# Patient Record
Sex: Female | Born: 1975 | Race: Black or African American | Hispanic: No | Marital: Single | Smoking: Current every day smoker
Health system: Southern US, Community
[De-identification: ages and names within clinical notes are randomized; demographics above are authoritative.]

## PROBLEM LIST (undated history)

## (undated) DIAGNOSIS — I671 Cerebral aneurysm, nonruptured: Secondary | ICD-10-CM

## (undated) DIAGNOSIS — R519 Headache, unspecified: Secondary | ICD-10-CM

## (undated) DIAGNOSIS — R51 Headache: Secondary | ICD-10-CM

## (undated) DIAGNOSIS — Z9289 Personal history of other medical treatment: Secondary | ICD-10-CM

## (undated) DIAGNOSIS — I471 Supraventricular tachycardia, unspecified: Secondary | ICD-10-CM

## (undated) DIAGNOSIS — R079 Chest pain, unspecified: Secondary | ICD-10-CM

## (undated) DIAGNOSIS — E041 Nontoxic single thyroid nodule: Secondary | ICD-10-CM

## (undated) DIAGNOSIS — I10 Essential (primary) hypertension: Secondary | ICD-10-CM

## (undated) DIAGNOSIS — B019 Varicella without complication: Secondary | ICD-10-CM

## (undated) DIAGNOSIS — Z720 Tobacco use: Secondary | ICD-10-CM

## (undated) DIAGNOSIS — G4730 Sleep apnea, unspecified: Secondary | ICD-10-CM

## (undated) HISTORY — DX: Supraventricular tachycardia, unspecified: I47.10

## (undated) HISTORY — DX: Personal history of other medical treatment: Z92.89

## (undated) HISTORY — DX: Chest pain, unspecified: R07.9

## (undated) HISTORY — DX: Nontoxic single thyroid nodule: E04.1

## (undated) HISTORY — DX: Sleep apnea, unspecified: G47.30

## (undated) HISTORY — DX: Cerebral aneurysm, nonruptured: I67.1

## (undated) HISTORY — DX: Tobacco use: Z72.0

## (undated) HISTORY — DX: Supraventricular tachycardia: I47.1

## (undated) HISTORY — DX: Varicella without complication: B01.9

---

## 2012-04-06 ENCOUNTER — Emergency Department: Payer: Self-pay | Admitting: Emergency Medicine

## 2012-04-06 LAB — URINALYSIS, COMPLETE
Bilirubin,UR: NEGATIVE
Ketone: NEGATIVE
Nitrite: POSITIVE
Ph: 6 (ref 4.5–8.0)
Protein: NEGATIVE
RBC,UR: 1 /HPF (ref 0–5)
Squamous Epithelial: 9

## 2012-04-06 LAB — COMPREHENSIVE METABOLIC PANEL
Albumin: 3.4 g/dL (ref 3.4–5.0)
Alkaline Phosphatase: 83 U/L (ref 50–136)
Anion Gap: 5 — ABNORMAL LOW (ref 7–16)
BUN: 13 mg/dL (ref 7–18)
Bilirubin,Total: 0.3 mg/dL (ref 0.2–1.0)
Calcium, Total: 9 mg/dL (ref 8.5–10.1)
Co2: 31 mmol/L (ref 21–32)
Creatinine: 0.88 mg/dL (ref 0.60–1.30)
EGFR (Non-African Amer.): >60
Glucose: 106 mg/dL — ABNORMAL HIGH (ref 65–99)
Osmolality: 284 (ref 275–301)
Potassium: 3.2 mmol/L — ABNORMAL LOW (ref 3.5–5.1)
SGPT (ALT): 23 U/L (ref 12–78)
Sodium: 142 mmol/L (ref 136–145)
Total Protein: 7.3 g/dL (ref 6.4–8.2)

## 2012-04-06 LAB — CBC
HCT: 38 % (ref 35.0–47.0)
HGB: 12.8 g/dL (ref 12.0–16.0)
MCH: 27 pg (ref 26.0–34.0)
MCV: 80 fL (ref 80–100)
Platelet: 231 10*3/uL (ref 150–440)
RBC: 4.74 10*6/uL (ref 3.80–5.20)
WBC: 7.7 10*3/uL (ref 3.6–11.0)

## 2012-04-06 LAB — TROPONIN I: Troponin-I: <0.02 ng/mL

## 2012-08-21 ENCOUNTER — Emergency Department: Payer: Self-pay | Admitting: Emergency Medicine

## 2012-08-21 LAB — URINALYSIS, COMPLETE
Blood: NEGATIVE
Glucose,UR: NEGATIVE mg/dL (ref 0–75)
Leukocyte Esterase: NEGATIVE
Ph: 6 (ref 4.5–8.0)
Protein: 30
RBC,UR: 3 /HPF (ref 0–5)
Specific Gravity: 1.027 (ref 1.003–1.030)
Squamous Epithelial: 2

## 2012-10-28 ENCOUNTER — Emergency Department: Payer: Self-pay | Admitting: Unknown Physician Specialty

## 2012-10-28 LAB — URINALYSIS, COMPLETE
Bilirubin,UR: NEGATIVE
Blood: NEGATIVE
Glucose,UR: NEGATIVE mg/dL (ref 0–75)
Leukocyte Esterase: NEGATIVE
Protein: NEGATIVE
RBC,UR: 1 /HPF (ref 0–5)
Squamous Epithelial: 3

## 2012-10-28 LAB — BASIC METABOLIC PANEL
Calcium, Total: 8.7 mg/dL (ref 8.5–10.1)
Co2: 27 mmol/L (ref 21–32)
EGFR (Non-African Amer.): >60
Glucose: 93 mg/dL (ref 65–99)

## 2012-10-28 LAB — CBC
MCHC: 32.8 g/dL (ref 32.0–36.0)
MCV: 80 fL (ref 80–100)
RBC: 5.03 10*6/uL (ref 3.80–5.20)
RDW: 15.7 % — ABNORMAL HIGH (ref 11.5–14.5)

## 2012-10-28 LAB — CK TOTAL AND CKMB (NOT AT ARMC)
CK, Total: 136 U/L (ref 21–215)
CK-MB: 2.6 ng/mL (ref 0.5–3.6)

## 2012-10-28 LAB — LIPASE, BLOOD: Lipase: 69 U/L — ABNORMAL LOW (ref 73–393)

## 2012-10-31 ENCOUNTER — Emergency Department: Payer: Self-pay | Admitting: Emergency Medicine

## 2012-10-31 LAB — COMPREHENSIVE METABOLIC PANEL
Albumin: 3.8 g/dL (ref 3.4–5.0)
Anion Gap: 4 — ABNORMAL LOW (ref 7–16)
BUN: 16 mg/dL (ref 7–18)
Bilirubin,Total: 0.4 mg/dL (ref 0.2–1.0)
Calcium, Total: 9.3 mg/dL (ref 8.5–10.1)
Co2: 30 mmol/L (ref 21–32)
Creatinine: 0.97 mg/dL (ref 0.60–1.30)
EGFR (African American): >60
EGFR (Non-African Amer.): >60
Glucose: 86 mg/dL (ref 65–99)
Potassium: 3.4 mmol/L — ABNORMAL LOW (ref 3.5–5.1)
Sodium: 137 mmol/L (ref 136–145)

## 2012-10-31 LAB — CBC WITH DIFFERENTIAL/PLATELET
Basophil #: 0.2 10*3/uL — ABNORMAL HIGH (ref 0.0–0.1)
Basophil %: 2.8 %
Eosinophil #: 0.2 10*3/uL (ref 0.0–0.7)
Eosinophil %: 2.9 %
HCT: 41.3 % (ref 35.0–47.0)
Lymphocyte #: 3.1 10*3/uL (ref 1.0–3.6)
MCV: 81 fL (ref 80–100)
Monocyte #: 0.6 x10 3/mm (ref 0.2–0.9)
Neutrophil #: 3.6 10*3/uL (ref 1.4–6.5)
Neutrophil %: 47.3 %
Platelet: 270 10*3/uL (ref 150–440)
RBC: 5.12 10*6/uL (ref 3.80–5.20)
RDW: 15.5 % — ABNORMAL HIGH (ref 11.5–14.5)
WBC: 7.7 10*3/uL (ref 3.6–11.0)

## 2012-10-31 LAB — LIPASE, BLOOD: Lipase: 85 U/L (ref 73–393)

## 2013-08-25 ENCOUNTER — Emergency Department: Payer: Self-pay | Admitting: Emergency Medicine

## 2013-08-25 LAB — BASIC METABOLIC PANEL
Anion Gap: 6 — ABNORMAL LOW (ref 7–16)
BUN: 10 mg/dL (ref 7–18)
CALCIUM: 9 mg/dL (ref 8.5–10.1)
Chloride: 106 mmol/L (ref 98–107)
Co2: 30 mmol/L (ref 21–32)
Creatinine: 0.79 mg/dL (ref 0.60–1.30)
EGFR (African American): >60
EGFR (Non-African Amer.): >60
GLUCOSE: 107 mg/dL — AB (ref 65–99)
Osmolality: 283 (ref 275–301)
POTASSIUM: 2.9 mmol/L — AB (ref 3.5–5.1)
Sodium: 142 mmol/L (ref 136–145)

## 2013-08-25 LAB — CBC
HCT: 36.7 % (ref 35.0–47.0)
HGB: 11.9 g/dL — ABNORMAL LOW (ref 12.0–16.0)
MCH: 25.9 pg — ABNORMAL LOW (ref 26.0–34.0)
MCHC: 32.5 g/dL (ref 32.0–36.0)
MCV: 80 fL (ref 80–100)
Platelet: 253 10*3/uL (ref 150–440)
RBC: 4.6 10*6/uL (ref 3.80–5.20)
RDW: 15.6 % — AB (ref 11.5–14.5)
WBC: 8.3 10*3/uL (ref 3.6–11.0)

## 2013-08-25 LAB — URINALYSIS, COMPLETE
BLOOD: NEGATIVE
Bacteria: NONE SEEN
Bilirubin,UR: NEGATIVE
Glucose,UR: NEGATIVE mg/dL (ref 0–75)
LEUKOCYTE ESTERASE: NEGATIVE
Nitrite: POSITIVE
Ph: 6 (ref 4.5–8.0)
Protein: NEGATIVE
RBC,UR: 1 /HPF (ref 0–5)
SPECIFIC GRAVITY: 1.015 (ref 1.003–1.030)
Squamous Epithelial: 2
WBC UR: 1 /HPF (ref 0–5)

## 2013-08-25 LAB — TROPONIN I: Troponin-I: 0.02 ng/mL

## 2015-01-05 ENCOUNTER — Encounter: Payer: Self-pay | Admitting: General Practice

## 2015-01-05 ENCOUNTER — Emergency Department: Payer: BLUE CROSS/BLUE SHIELD

## 2015-01-05 ENCOUNTER — Emergency Department
Admission: EM | Admit: 2015-01-05 | Discharge: 2015-01-05 | Disposition: A | Discharge status: Home / Self Care | Payer: BLUE CROSS/BLUE SHIELD | Attending: Emergency Medicine | Admitting: Emergency Medicine

## 2015-01-05 DIAGNOSIS — I10 Essential (primary) hypertension: Secondary | ICD-10-CM

## 2015-01-05 DIAGNOSIS — J069 Acute upper respiratory infection, unspecified: Secondary | ICD-10-CM | POA: Diagnosis not present

## 2015-01-05 DIAGNOSIS — R059 Cough, unspecified: Secondary | ICD-10-CM

## 2015-01-05 DIAGNOSIS — Z720 Tobacco use: Secondary | ICD-10-CM | POA: Diagnosis not present

## 2015-01-05 DIAGNOSIS — R05 Cough: Secondary | ICD-10-CM | POA: Diagnosis present

## 2015-01-05 HISTORY — DX: Essential (primary) hypertension: I10

## 2015-01-05 LAB — CBC WITH DIFFERENTIAL/PLATELET
BASOS ABS: 0 10*3/uL (ref 0–0.1)
Basophils Relative: 1 %
EOS ABS: 0.2 10*3/uL (ref 0–0.7)
Eosinophils Relative: 4 %
HEMATOCRIT: 38.4 % (ref 35.0–47.0)
HEMOGLOBIN: 12.6 g/dL (ref 12.0–16.0)
LYMPHS PCT: 29 %
Lymphs Abs: 1.1 10*3/uL (ref 1.0–3.6)
MCH: 26 pg (ref 26.0–34.0)
MCHC: 32.7 g/dL (ref 32.0–36.0)
MCV: 79.6 fL — AB (ref 80.0–100.0)
MONO ABS: 0.7 10*3/uL (ref 0.2–0.9)
Monocytes Relative: 17 %
NEUTROS PCT: 49 %
Neutro Abs: 2 10*3/uL (ref 1.4–6.5)
Platelets: 171 10*3/uL (ref 150–440)
RBC: 4.82 MIL/uL (ref 3.80–5.20)
RDW: 16.4 % — ABNORMAL HIGH (ref 11.5–14.5)
WBC: 4 10*3/uL (ref 3.6–11.0)

## 2015-01-05 LAB — COMPREHENSIVE METABOLIC PANEL
ALBUMIN: 3.7 g/dL (ref 3.5–5.0)
ALK PHOS: 65 U/L (ref 38–126)
ALT: 23 U/L (ref 14–54)
AST: 23 U/L (ref 15–41)
Anion gap: 8 (ref 5–15)
BUN: 14 mg/dL (ref 6–20)
CHLORIDE: 104 mmol/L (ref 101–111)
CO2: 26 mmol/L (ref 22–32)
CREATININE: 1.01 mg/dL — AB (ref 0.44–1.00)
Calcium: 9.3 mg/dL (ref 8.9–10.3)
GFR calc Af Amer: >60 mL/min (ref >60)
Glucose, Bld: 106 mg/dL — ABNORMAL HIGH (ref 65–99)
Potassium: 3.2 mmol/L — ABNORMAL LOW (ref 3.5–5.1)
SODIUM: 138 mmol/L (ref 135–145)
Total Bilirubin: 0.6 mg/dL (ref 0.3–1.2)
Total Protein: 7.4 g/dL (ref 6.5–8.1)

## 2015-01-05 NOTE — ED Notes (Signed)
Pt informed to return if life threatening symptoms occur.   

## 2015-01-05 NOTE — ED Notes (Signed)
Pt. Arrived to ed from home with reports of experiencing cold symptoms over the last week. Pt states "it started as a runny nose, now i have a cough". Pt reprots experiencing a cough over the last few days. Pt describes having a mild fever at home, using OTC medications. Hx of  101 fever per pt.

## 2015-01-05 NOTE — ED Provider Notes (Signed)
[facility] Emergency Department Provider Note  ____________________________________________  Time seen: 12 PM  I have reviewed the triage vital signs and the nursing notes.   HISTORY  Chief Complaint Cough; Fever; and URI      HPI Bianca Acosta is a 39 y.o. female who complains of runny nose one week ago that then turned into a cough for the last 4-5 days. She became concerned because she had a fever yesterday of 102.1 which is improved today. She denies short of breath. She denies chest pain. She reports yellow productive mucus with cough. She doesn't smoking. She describes the severity as a 5 out of 10. No pleurisy     Past Medical History  Diagnosis Date  . Hypertension     There are no active problems to display for this patient.   History reviewed. No pertinent past surgical history.  No current outpatient prescriptions on file.  Allergies Ciprofloxacin  No family history on file.  Social History History  Substance Use Topics  . Smoking status: Current Every Day Smoker -- 0.50 packs/day  . Smokeless tobacco: Never Used  . Alcohol Use: No    Review of Systems  Constitutional: Negative for fever. Eyes: Negative for visual changes. ENT: Negative for sore throat. Positive for rhinorrhea Cardiovascular: Negative for chest pain. Respiratory: Negative for shortness of breath. Positive for cough Gastrointestinal: Negative for abdominal pain, vomiting and diarrhea. Genitourinary: Negative for dysuria. Musculoskeletal: Negative for back pain. Skin: Negative for rash. Neurological: Negative for headaches or focal weakness Psychiatric: Positive for anxiety  10-point ROS otherwise negative.  ____________________________________________   PHYSICAL EXAM:  VITAL SIGNS: ED Triage Vitals  Enc Vitals Group     BP 01/05/15 1043 220/108 mmHg     Pulse Rate 01/05/15 1043 107     Resp 01/05/15 1043 18     Temp 01/05/15 1043 99 F  (37.2 C)     Temp Source 01/05/15 1043 Oral     SpO2 01/05/15 1043 99 %     Weight 01/05/15 1043 276 lb (125.193 kg)     Height 01/05/15 1043 5\' 3"  (1.6 m)     Head Cir --      Peak Flow --      Pain Score 01/05/15 1044 0     Pain Loc --      Pain Edu? --      Excl. in [place]? --     Constitutional: Alert and oriented. Well appearing and in no distress. Eyes: Conjunctivae are normal. PERRL. ENT   Head: Normocephalic and atraumatic.   Nose: Positive for rhinorrhea   Mouth/Throat: Mucous membranes are moist. Cardiovascular: Normal rate, regular rhythm. Normal and symmetric distal pulses are present in all extremities. No murmurs, rubs, or gallops. Respiratory: Normal respiratory effort without tachypnea nor retractions. Breath sounds are clear and equal bilaterally.  Gastrointestinal: Soft and non-tender in all quadrants. No distention. There is no CVA tenderness. Genitourinary: deferred Musculoskeletal: Nontender with normal range of motion in all extremities. No lower extremity tenderness nor edema. Neurologic:  Normal speech and language. No gross focal neurologic deficits are appreciated. Skin:  Skin is warm, dry and intact. No rash noted. Psychiatric: Mood and affect are normal. Patient exhibits appropriate insight and judgment.  ____________________________________________    LABS (pertinent positives/negatives)  Labs Reviewed  CBC WITH DIFFERENTIAL/PLATELET - Abnormal; Notable for the following:    MCV 79.6 (*)    RDW 16.4 (*)    All other components within normal  limits  COMPREHENSIVE METABOLIC PANEL - Abnormal; Notable for the following:    Potassium 3.2 (*)    Glucose, Bld 106 (*)    Creatinine, Ser 1.01 (*)    All other components within normal limits    ____________________________________________   EKG  None  ____________________________________________    RADIOLOGY  Chest x-ray no acute  distress  ____________________________________________   PROCEDURES  Procedure(s) performed: none  Critical Care performed: none  ____________________________________________   INITIAL IMPRESSION / ASSESSMENT AND PLAN / ED COURSE  Pertinent labs & imaging results that were available during my care of the patient were reviewed by me and considered in my medical decision making (see chart for details).  Patient well-appearing. History of present illness consistent with upper respiratory infection likely viral in nature. Chest x-ray clear. Labs benign. Recommend supportive care. Patient with a history of hypertension. The pressure improved without treatment in the emergency department given time. Recommend follow-up with her PCP for further blood pressure management.  ____________________________________________   FINAL CLINICAL IMPRESSION(S) / ED DIAGNOSES  Final diagnoses:  Upper respiratory infection  Essential hypertension     Lavonia Drafts, MD 01/05/15 1213

## 2015-01-05 NOTE — Discharge Instructions (Signed)
Upper Respiratory Infection, Adult An upper respiratory infection (URI) is also known as the common cold. It is often caused by a type of germ (virus). Colds are easily spread (contagious). You can pass it to others by kissing, coughing, sneezing, or drinking out of the same glass. Usually, you get better in 1 or 2 weeks.  HOME CARE   Only take medicine as told by your doctor.  Use a warm mist humidifier or breathe in steam from a hot shower.  Drink enough water and fluids to keep your pee (urine) clear or pale yellow.  Get plenty of rest.  Return to work when your temperature is back to normal or as told by your doctor. You may use a face mask and wash your hands to stop your cold from spreading. GET HELP RIGHT AWAY IF:   After the first few days, you feel you are getting worse.  You have questions about your medicine.  You have chills, shortness of breath, or brown or red spit (mucus).  You have yellow or brown snot (nasal discharge) or pain in the face, especially when you bend forward.  You have a fever, puffy (swollen) neck, pain when you swallow, or white spots in the back of your throat.  You have a bad headache, ear pain, sinus pain, or chest pain.  You have a high-pitched whistling sound when you breathe in and out (wheezing).  You have a lasting cough or cough up blood.  You have sore muscles or a stiff neck. MAKE SURE YOU:   Understand these instructions.  Will watch your condition.  Will get help right away if you are not doing well or get worse. Document Released: 01/16/2008 Document Revised: 10/22/2011 Document Reviewed: 11/04/2013 ExitCare Patient Information 2015 [employer]. This information is not intended to replace advice given to you by your health care provider. Make sure you discuss any questions you have with your health care provider.  

## 2015-06-14 ENCOUNTER — Other Ambulatory Visit: Payer: Self-pay | Admitting: Ophthalmology

## 2015-06-14 DIAGNOSIS — H532 Diplopia: Secondary | ICD-10-CM

## 2015-06-15 ENCOUNTER — Ambulatory Visit
Admission: RE | Admit: 2015-06-15 | Discharge: 2015-06-15 | Disposition: A | Discharge status: Home / Self Care | Payer: BLUE CROSS/BLUE SHIELD | Source: Ambulatory Visit | Attending: Ophthalmology | Admitting: Ophthalmology

## 2015-06-15 ENCOUNTER — Other Ambulatory Visit: Payer: Self-pay | Admitting: Nurse Practitioner

## 2015-06-15 DIAGNOSIS — H0520 Unspecified exophthalmos: Secondary | ICD-10-CM | POA: Diagnosis not present

## 2015-06-15 DIAGNOSIS — H532 Diplopia: Secondary | ICD-10-CM

## 2015-06-15 DIAGNOSIS — R51 Headache: Principal | ICD-10-CM

## 2015-06-15 DIAGNOSIS — R519 Headache, unspecified: Secondary | ICD-10-CM

## 2015-06-15 MED ORDER — IOHEXOL 350 MG/ML SOLN
75.0000 mL | Freq: Once | INTRAVENOUS | Status: AC | PRN
  Administered 2015-06-15: 75 mL via INTRAVENOUS

## 2015-06-18 ENCOUNTER — Emergency Department: Payer: BLUE CROSS/BLUE SHIELD

## 2015-06-18 ENCOUNTER — Encounter: Payer: Self-pay | Admitting: Emergency Medicine

## 2015-06-18 ENCOUNTER — Emergency Department
Admission: EM | Admit: 2015-06-18 | Discharge: 2015-06-18 | Disposition: A | Discharge status: Home / Self Care | Payer: BLUE CROSS/BLUE SHIELD | Attending: Emergency Medicine | Admitting: Emergency Medicine

## 2015-06-18 DIAGNOSIS — R51 Headache: Secondary | ICD-10-CM | POA: Insufficient documentation

## 2015-06-18 DIAGNOSIS — Z79899 Other long term (current) drug therapy: Secondary | ICD-10-CM | POA: Insufficient documentation

## 2015-06-18 DIAGNOSIS — Z720 Tobacco use: Secondary | ICD-10-CM | POA: Insufficient documentation

## 2015-06-18 DIAGNOSIS — I10 Essential (primary) hypertension: Secondary | ICD-10-CM | POA: Diagnosis not present

## 2015-06-18 DIAGNOSIS — R519 Headache, unspecified: Secondary | ICD-10-CM

## 2015-06-18 HISTORY — DX: Headache, unspecified: R51.9

## 2015-06-18 HISTORY — DX: Headache: R51

## 2015-06-18 LAB — SEDIMENTATION RATE: Sed Rate: 19 mm/hr (ref 0–20)

## 2015-06-18 MED ORDER — HYDROMORPHONE HCL 2 MG PO TABS
2.0000 mg | ORAL_TABLET | Freq: Two times a day (BID) | ORAL | Status: DC | PRN
Start: 2015-06-18 — End: 2015-11-01

## 2015-06-18 MED ORDER — METOCLOPRAMIDE HCL 5 MG/ML IJ SOLN
10.0000 mg | Freq: Once | INTRAMUSCULAR | Status: AC
  Administered 2015-06-18: 10 mg via INTRAVENOUS
  Filled 2015-06-18: qty 2

## 2015-06-18 MED ORDER — HYDROMORPHONE HCL 1 MG/ML IJ SOLN
1.0000 mg | Freq: Once | INTRAMUSCULAR | Status: AC
  Administered 2015-06-18: 1 mg via INTRAVENOUS
  Filled 2015-06-18: qty 1

## 2015-06-18 MED ORDER — HYDROMORPHONE HCL 2 MG PO TABS
2.0000 mg | ORAL_TABLET | Freq: Once | ORAL | Status: AC
  Administered 2015-06-18: 2 mg via ORAL
  Filled 2015-06-18: qty 1

## 2015-06-18 MED ORDER — SODIUM CHLORIDE 0.9 % IV BOLUS (SEPSIS)
1000.0000 mL | Freq: Once | INTRAVENOUS | Status: AC
  Administered 2015-06-18: 1000 mL via INTRAVENOUS

## 2015-06-18 MED ORDER — METOCLOPRAMIDE HCL 10 MG PO TABS
10.0000 mg | ORAL_TABLET | Freq: Three times a day (TID) | ORAL | Status: DC | PRN
Start: 2015-06-18 — End: 2016-04-23

## 2015-06-18 NOTE — ED Provider Notes (Signed)
Time Seen: Approximately 1223  I have reviewed the triage notes  Chief Complaint: Headache   History of Present Illness: Bianca Acosta is a 39 y.o. female who presents with a two-week history of a right-sided throbbing headache which radiates from behind her right eye back toward the right occipital area. She has been seen and evaluated by her primary physician and diagnosed with migraines since been taking Fioricet. Since also been followed by an ophthalmologist and has seen a specialist for unknown pathology at this time. She states she's had a recent history of double vision hours and wears a right eye patch. She denies any neck pain, fever, head trauma. She states she's had a focus CT of behind her right eye but her primary physician had described to her the necessity for a complete head CT. Patient denies any focal weakness at this time.  Past Medical History  Diagnosis Date  . Hypertension   . Headache     There are no active problems to display for this patient.   History reviewed. No pertinent past surgical history.  History reviewed. No pertinent past surgical history.  Current Outpatient Rx  Name  Route  Sig  Dispense  Refill  . butalbital-acetaminophen-caffeine (FIORICET WITH CODEINE) 50-325-40-30 MG capsule   Oral   Take 1 capsule by mouth 2 (two) times daily as needed. For headaches.         Marland Kitchen BYSTOLIC 10 MG tablet   Oral   Take 10 mg by mouth daily.           Dispense as written.   . meclizine (ANTIVERT) 12.5 MG tablet   Oral   Take 12.5 mg by mouth 2 (two) times daily as needed. For dizziness.         . triamterene-hydrochlorothiazide (DYAZIDE) 50-25 MG capsule   Oral   Take 1 capsule by mouth daily.         Marland Kitchen HYDROmorphone (DILAUDID) 2 MG tablet   Oral   Take 1 tablet (2 mg total) by mouth every 12 (twelve) hours as needed for severe pain.   20 tablet   0   . metoCLOPramide (REGLAN) 10 MG tablet   Oral   Take 1 tablet (10 mg total) by  mouth every 8 (eight) hours as needed for nausea.   30 tablet   1     Allergies:  Augmentin and Ciprofloxacin  Family History: History reviewed. No pertinent family history.  Social History: Social History  Substance Use Topics  . Smoking status: Current Every Day Smoker -- 0.50 packs/day  . Smokeless tobacco: Never Used  . Alcohol Use: No     Review of Systems:   10 point review of systems was performed and was otherwise negative:  Constitutional: No fever Eyes: No visual disturbances ENT: No sore throat, ear pain Cardiac: No chest pain Respiratory: No shortness of breath, wheezing, or stridor Abdomen: No abdominal pain, no vomiting, No diarrhea Endocrine: No weight loss, No night sweats Extremities: No peripheral edema, cyanosis Skin: No rashes, easy bruising Neurologic: No focal weakness, trouble with speech or swollowing Urologic: No dysuria, Hematuria, or urinary frequency   Physical Exam:  ED Triage Vitals  Enc Vitals Group     BP 06/18/15 1213 212/96 mmHg     Pulse Rate 06/18/15 1205 48     Resp 06/18/15 1205 20     Temp 06/18/15 1205 98.9 F (37.2 C)     Temp Source 06/18/15 1205 Oral  SpO2 06/18/15 1205 97 %     Weight 06/18/15 1205 246 lb (111.585 kg)     Height 06/18/15 1205 5\' 4"  (1.626 m)     Head Cir --      Peak Flow --      Pain Score 06/18/15 1206 10     Pain Loc --      Pain Edu? --      Excl. in [place]? --     General: Awake , Alert , and Oriented times 3; GCS 15 Head: Normal cephalic , atraumatic Eyes: Pupils equal , round, reactive to light Nose/Throat: No nasal drainage, patent upper airway without erythema or exudate.  Neck: Supple, Full range of motion, No anterior adenopathy or palpable thyroid masses Lungs: Clear to ascultation without wheezes , rhonchi, or rales Heart: Regular rate, regular rhythm without murmurs , gallops , or rubs Abdomen: Soft, non tender without rebound, guarding , or rigidity; bowel sounds positive and  symmetric in all 4 quadrants. No organomegaly .        Extremities: 2 plus symmetric pulses. No edema, clubbing or cyanosis Neurologic: normal ambulation, Motor symmetric without deficits, sensory intact Skin: warm, dry, no rashes   Labs:   All laboratory work was reviewed including any pertinent negatives or positives listed below:  Labs Reviewed  SEDIMENTATION RATE   sedimentation rate was negative    Radiology:   EXAM: CT HEAD WITHOUT CONTRAST  TECHNIQUE: Contiguous axial images were obtained from the base of the skull through the vertex without intravenous contrast.  COMPARISON: Orbital CT - 06/15/2015 ; head CT - 04/06/2012  FINDINGS: Gray-white differentiation is maintained. No CT evidence of acute large territory infarct. No intraparenchymal extra-axial mass or hemorrhage. Normal size and configuration of the ventricles and basilar cisterns. No midline shift. Limited visualization of the paranasal sinuses and mastoid air cells is normal. No air-fluid levels.  Re- demonstrated bilateral exophthalmos, similar to remote head CT performed 03/2012. Normal noncontrast appearance of the bilateral orbits and globes. No retrobulbar hematoma.  Regional soft tissues appear normal. No displaced calvarial fracture.  IMPRESSION: 1. Negative noncontrast head CT. 2. Similar findings of symmetric bilateral exophthalmos, stable since the 03/2012 examination.     I personally reviewed the radiologic studies    ED Course:  Differential diagnosis includes but is not exclusive to subarachnoid hemorrhage, meningitis, encephalitis, previous head trauma, cavernous venous thrombosis, muscle tension headache, temporal arteritis, migraine or migraine equivalent, etc. Given the patient's current clinical presentation and objective findings I felt this was some form of migraine equivalent headache given the throbbing nature with associated nausea. Headache is unlikely to be  immediately life-threatening problem based on normal neurologic exam along with no neck pain and a normal head CT after almost 2 weeks of cephalgia. Patient is of understanding that any eye complaints have been seen and evaluated by ophthalmology and she could should continue with follow-up. She may require an MRI to assess for multiple sclerosis, etc. Patient appears to be of understanding and pain control with IV fluid, Reglan, and Dilaudid. She will be discharged on similar medications advised to return here if she develops any focal weakness, fever, rash, any other new concerns.    Assessment:  Acute exacerbation of unspecified cephalgia   Final Clinical Impression:  Final diagnoses:  Acute nonintractable headache, unspecified headache type     Plan:  Outpatient management Patient was advised to return immediately if condition worsens. Patient was advised to follow up with her primary care  physician or other specialized physicians involved and in their current assessment.            Daymon Larsen, MD 06/18/15 [phone]

## 2015-06-18 NOTE — Discharge Instructions (Signed)
General Headache Without Cause A headache is pain or discomfort felt around the head or neck area. The specific cause of a headache may not be found. There are many causes and types of headaches. A few common ones are:  Tension headaches.  Migraine headaches.  Cluster headaches.  Chronic daily headaches. HOME CARE INSTRUCTIONS  Watch your condition for any changes. Take these steps to help with your condition: Managing Pain  Take over-the-counter and prescription medicines only as told by your health care provider.  Lie down in a dark, quiet room when you have a headache.  If directed, apply ice to the head and neck area:  Put ice in a plastic bag.  Place a towel between your skin and the bag.  Leave the ice on for 20 minutes, 2-3 times per day.  Use a heating pad or hot shower to apply heat to the head and neck area as told by your health care provider.  Keep lights dim if bright lights bother you or make your headaches worse. Eating and Drinking  Eat meals on a regular schedule.  Limit alcohol use.  Decrease the amount of caffeine you drink, or stop drinking caffeine. General Instructions  Keep all follow-up visits as told by your health care provider. This is important.  Keep a headache journal to help find out what may trigger your headaches. For example, write down:  What you eat and drink.  How much sleep you get.  Any change to your diet or medicines.  Try massage or other relaxation techniques.  Limit stress.  Sit up straight, and do not tense your muscles.  Do not use tobacco products, including cigarettes, chewing tobacco, or e-cigarettes. If you need help quitting, ask your health care provider.  Exercise regularly as told by your health care provider.  Sleep on a regular schedule. Get 7-9 hours of sleep, or the amount recommended by your health care provider. SEEK MEDICAL CARE IF:   Your symptoms are not helped by medicine.  You have a  headache that is different from the usual headache.  You have nausea or you vomit.  You have a fever. SEEK IMMEDIATE MEDICAL CARE IF:   Your headache becomes severe.  You have repeated vomiting.  You have a stiff neck.  You have a loss of vision.  You have problems with speech.  You have pain in the eye or ear.  You have muscular weakness or loss of muscle control.  You lose your balance or have trouble walking.  You feel faint or pass out.  You have confusion.   This information is not intended to replace advice given to you by your health care provider. Make sure you discuss any questions you have with your health care provider.   Document Released: 07/30/2005 Document Revised: 04/20/2015 Document Reviewed: 11/22/2014 Elsevier Interactive Patient Education [employer].  Please return immediately if condition worsens. Please contact her primary physician or the physician you were given for referral. If you have any specialist physicians involved in her treatment and plan please also contact them. Thank you for using [place] [facility]. Return especially for fever, uncontrolled vomiting, focal weakness, or any new concerns

## 2015-06-18 NOTE — ED Notes (Signed)
Pt to ed with c/o headache x 2 weeks intermittently, pt states it is on the right of head and causes double vision. Pt took off blood pressure cuff while it was processing and states "it is too tight" pt with eyes closed during triage.

## 2015-06-18 NOTE — ED Notes (Signed)
Patient transported to CT 

## 2015-06-18 NOTE — ED Notes (Signed)
Pt brought in via triage w/ headache x 2 weeks roughly; pt states vision loss on Monday.  Pt went to doctor on Monday and is being treated for a migraine.  Pt states pain medicine did not help today; pt reports pain increasing on right side of head and double vision and loss of balance without eye patch on right eye. Pt A/Ox4, BP elevated 212/96 (hx of hypertension), other vitals WDL.

## 2015-06-23 ENCOUNTER — Ambulatory Visit: Payer: BLUE CROSS/BLUE SHIELD | Attending: Nurse Practitioner

## 2015-06-28 ENCOUNTER — Emergency Department (HOSPITAL_COMMUNITY): Payer: BLUE CROSS/BLUE SHIELD

## 2015-06-28 ENCOUNTER — Inpatient Hospital Stay (HOSPITAL_COMMUNITY)
Admission: EM | Admit: 2015-06-28 | Discharge: 2015-07-01 | DRG: 026 | Disposition: A | Discharge status: Home / Self Care | Payer: BLUE CROSS/BLUE SHIELD | Attending: Neurological Surgery | Admitting: Neurological Surgery

## 2015-06-28 ENCOUNTER — Encounter (HOSPITAL_COMMUNITY): Payer: Self-pay

## 2015-06-28 DIAGNOSIS — H4901 Third [oculomotor] nerve palsy, right eye: Secondary | ICD-10-CM | POA: Diagnosis present

## 2015-06-28 DIAGNOSIS — Z01818 Encounter for other preprocedural examination: Secondary | ICD-10-CM

## 2015-06-28 DIAGNOSIS — F17210 Nicotine dependence, cigarettes, uncomplicated: Secondary | ICD-10-CM | POA: Diagnosis present

## 2015-06-28 DIAGNOSIS — Z792 Long term (current) use of antibiotics: Secondary | ICD-10-CM | POA: Diagnosis not present

## 2015-06-28 DIAGNOSIS — H532 Diplopia: Secondary | ICD-10-CM | POA: Diagnosis present

## 2015-06-28 DIAGNOSIS — I10 Essential (primary) hypertension: Secondary | ICD-10-CM | POA: Diagnosis present

## 2015-06-28 DIAGNOSIS — Z6841 Body Mass Index (BMI) 40.0 and over, adult: Secondary | ICD-10-CM | POA: Diagnosis not present

## 2015-06-28 DIAGNOSIS — I671 Cerebral aneurysm, nonruptured: Principal | ICD-10-CM | POA: Diagnosis present

## 2015-06-28 DIAGNOSIS — R51 Headache: Secondary | ICD-10-CM

## 2015-06-28 DIAGNOSIS — Z7952 Long term (current) use of systemic steroids: Secondary | ICD-10-CM | POA: Diagnosis not present

## 2015-06-28 DIAGNOSIS — R519 Headache, unspecified: Secondary | ICD-10-CM

## 2015-06-28 DIAGNOSIS — I729 Aneurysm of unspecified site: Secondary | ICD-10-CM

## 2015-06-28 DIAGNOSIS — Z79899 Other long term (current) drug therapy: Secondary | ICD-10-CM | POA: Diagnosis not present

## 2015-06-28 LAB — BASIC METABOLIC PANEL
ANION GAP: 8 (ref 5–15)
Anion gap: 6 (ref 5–15)
BUN: 15 mg/dL (ref 6–20)
BUN: 17 mg/dL (ref 6–20)
CALCIUM: 8.7 mg/dL — AB (ref 8.9–10.3)
CHLORIDE: 103 mmol/L (ref 101–111)
CO2: 27 mmol/L (ref 22–32)
CO2: 25 mmol/L (ref 22–32)
CREATININE: 1.18 mg/dL — AB (ref 0.44–1.00)
CREATININE: 1.13 mg/dL — AB (ref 0.44–1.00)
Calcium: 9.3 mg/dL (ref 8.9–10.3)
Chloride: 104 mmol/L (ref 101–111)
GFR calc Af Amer: >60 mL/min (ref >60)
GFR calc non Af Amer: 58 mL/min — ABNORMAL LOW (ref >60)
GLUCOSE: 151 mg/dL — AB (ref 65–99)
Glucose, Bld: 110 mg/dL — ABNORMAL HIGH (ref 65–99)
Potassium: 4 mmol/L (ref 3.5–5.1)
Potassium: 3.8 mmol/L (ref 3.5–5.1)
Sodium: 136 mmol/L (ref 135–145)
Sodium: 137 mmol/L (ref 135–145)

## 2015-06-28 LAB — CBC WITH DIFFERENTIAL/PLATELET
BASOS PCT: 0 %
Basophils Absolute: 0 10*3/uL (ref 0.0–0.1)
EOS ABS: 0 10*3/uL (ref 0.0–0.7)
Eosinophils Relative: 0 %
HCT: 41.5 % (ref 36.0–46.0)
Hemoglobin: 13.7 g/dL (ref 12.0–15.0)
LYMPHS ABS: 2 10*3/uL (ref 0.7–4.0)
Lymphocytes Relative: 15 %
MCH: 26.1 pg (ref 26.0–34.0)
MCHC: 33 g/dL (ref 30.0–36.0)
MCV: 79.2 fL (ref 78.0–100.0)
MONOS PCT: 3 %
Monocytes Absolute: 0.4 10*3/uL (ref 0.1–1.0)
NEUTROS PCT: 81 %
Neutro Abs: 10.3 10*3/uL — ABNORMAL HIGH (ref 1.7–7.7)
Platelets: 216 10*3/uL (ref 150–400)
RBC: 5.24 MIL/uL — ABNORMAL HIGH (ref 3.87–5.11)
RDW: 15.8 % — AB (ref 11.5–15.5)
WBC: 12.7 10*3/uL — ABNORMAL HIGH (ref 4.0–10.5)

## 2015-06-28 LAB — PROTIME-INR
INR: 1.16 (ref 0.00–1.49)
Prothrombin Time: 15 seconds (ref 11.6–15.2)

## 2015-06-28 LAB — SEDIMENTATION RATE: Sed Rate: 3 mm/hr (ref 0–22)

## 2015-06-28 LAB — APTT: APTT: 29 s (ref 24–37)

## 2015-06-28 MED ORDER — DEXAMETHASONE SODIUM PHOSPHATE 4 MG/ML IJ SOLN
4.0000 mg | Freq: Four times a day (QID) | INTRAMUSCULAR | Status: DC
  Filled 2015-06-28 (×2): qty 1

## 2015-06-28 MED ORDER — SULFAMETHOXAZOLE-TRIMETHOPRIM 800-160 MG PO TABS
1.0000 | ORAL_TABLET | Freq: Two times a day (BID) | ORAL | Status: DC
  Administered 2015-06-29 (×2): 1 via ORAL
  Filled 2015-06-28 (×4): qty 1

## 2015-06-28 MED ORDER — SODIUM CHLORIDE 0.9 % IV SOLN
INTRAVENOUS | Status: DC
  Administered 2015-06-28: 22:00:00 via INTRAVENOUS

## 2015-06-28 MED ORDER — ACETAMINOPHEN 325 MG PO TABS: 650.0000 mg | ORAL_TABLET | ORAL | Status: DC | PRN

## 2015-06-28 MED ORDER — ACETAMINOPHEN 650 MG RE SUPP: 650.0000 mg | RECTAL | Status: DC | PRN

## 2015-06-28 MED ORDER — DEXAMETHASONE SODIUM PHOSPHATE 4 MG/ML IJ SOLN: 4.0000 mg | Freq: Three times a day (TID) | INTRAMUSCULAR | Status: DC

## 2015-06-28 MED ORDER — PANTOPRAZOLE SODIUM 40 MG IV SOLR
40.0000 mg | Freq: Every day | INTRAVENOUS | Status: DC
  Administered 2015-06-29: 40 mg via INTRAVENOUS
  Filled 2015-06-28: qty 40

## 2015-06-28 MED ORDER — HYDROCODONE-ACETAMINOPHEN 5-325 MG PO TABS: 1.0000 | ORAL_TABLET | ORAL | Status: DC | PRN

## 2015-06-28 MED ORDER — SENNA 8.6 MG PO TABS
1.0000 | ORAL_TABLET | Freq: Two times a day (BID) | ORAL | Status: DC
  Administered 2015-06-29 (×2): 8.6 mg via ORAL
  Filled 2015-06-28 (×3): qty 1

## 2015-06-28 MED ORDER — NEBIVOLOL HCL 10 MG PO TABS
10.0000 mg | ORAL_TABLET | Freq: Every day | ORAL | Status: DC
  Filled 2015-06-28: qty 1

## 2015-06-28 MED ORDER — SENNOSIDES-DOCUSATE SODIUM 8.6-50 MG PO TABS
1.0000 | ORAL_TABLET | Freq: Every evening | ORAL | Status: DC | PRN
  Filled 2015-06-28: qty 1

## 2015-06-28 MED ORDER — DOCUSATE SODIUM 100 MG PO CAPS
100.0000 mg | ORAL_CAPSULE | Freq: Two times a day (BID) | ORAL | Status: DC
  Administered 2015-06-29 (×2): 100 mg via ORAL
  Filled 2015-06-28 (×3): qty 1

## 2015-06-28 MED ORDER — ONDANSETRON HCL 4 MG/2ML IJ SOLN
4.0000 mg | INTRAMUSCULAR | Status: DC | PRN
  Administered 2015-06-29 (×2): 4 mg via INTRAVENOUS
  Filled 2015-06-28 (×2): qty 2

## 2015-06-28 MED ORDER — LABETALOL HCL 5 MG/ML IV SOLN
10.0000 mg | INTRAVENOUS | Status: DC | PRN
Start: 2015-06-28 — End: 2015-06-30
  Administered 2015-06-29: 10 mg via INTRAVENOUS
  Administered 2015-06-29: 20 mg via INTRAVENOUS
  Filled 2015-06-28: qty 4
  Filled 2015-06-28: qty 8

## 2015-06-28 MED ORDER — HYDROMORPHONE HCL 1 MG/ML IJ SOLN
0.5000 mg | INTRAMUSCULAR | Status: DC | PRN
  Administered 2015-06-29: 1 mg via INTRAVENOUS
  Administered 2015-06-29: 0.5 mg via INTRAVENOUS
  Filled 2015-06-28 (×2): qty 1

## 2015-06-28 MED ORDER — ONDANSETRON HCL 4 MG/2ML IJ SOLN
4.0000 mg | Freq: Once | INTRAMUSCULAR | Status: AC
Start: 2015-06-28 — End: 2015-06-28
  Administered 2015-06-28: 4 mg via INTRAVENOUS
  Filled 2015-06-28: qty 2

## 2015-06-28 MED ORDER — BISACODYL 10 MG RE SUPP: 10.0000 mg | Freq: Every day | RECTAL | Status: DC | PRN

## 2015-06-28 MED ORDER — MANNITOL 25 % IV SOLN
100.0000 g | INTRAVENOUS | Status: DC
  Filled 2015-06-28: qty 400

## 2015-06-28 MED ORDER — MORPHINE SULFATE (PF) 4 MG/ML IV SOLN
4.0000 mg | Freq: Once | INTRAVENOUS | Status: AC
  Administered 2015-06-28: 4 mg via INTRAVENOUS
  Filled 2015-06-28: qty 1

## 2015-06-28 MED ORDER — SODIUM CHLORIDE 0.9 % IV SOLN
10.0000 mg | Freq: Once | INTRAVENOUS | Status: AC
  Administered 2015-06-28: 10 mg via INTRAVENOUS
  Filled 2015-06-28: qty 1

## 2015-06-28 MED ORDER — SODIUM CHLORIDE 0.9 % IV BOLUS (SEPSIS)
500.0000 mL | Freq: Once | INTRAVENOUS | Status: AC
Start: 2015-06-28 — End: 2015-06-28
  Administered 2015-06-28: 500 mL via INTRAVENOUS

## 2015-06-28 MED ORDER — LORAZEPAM 2 MG/ML IJ SOLN
1.0000 mg | Freq: Once | INTRAMUSCULAR | Status: AC
  Administered 2015-06-28: 1 mg via INTRAVENOUS
  Filled 2015-06-28: qty 1

## 2015-06-28 MED ORDER — PROMETHAZINE HCL 25 MG PO TABS
12.5000 mg | ORAL_TABLET | ORAL | Status: DC | PRN
Start: 2015-06-28 — End: 2015-06-30

## 2015-06-28 MED ORDER — HYDROCHLOROTHIAZIDE 25 MG PO TABS
25.0000 mg | ORAL_TABLET | Freq: Every day | ORAL | Status: DC
  Administered 2015-06-29: 25 mg via ORAL
  Filled 2015-06-28: qty 1

## 2015-06-28 MED ORDER — TRIAMTERENE-HCTZ 50-25 MG PO CAPS: 1.0000 | ORAL_CAPSULE | Freq: Every day | ORAL | Status: DC

## 2015-06-28 MED ORDER — ONDANSETRON HCL 4 MG PO TABS: 4.0000 mg | ORAL_TABLET | ORAL | Status: DC | PRN

## 2015-06-28 MED ORDER — FLEET ENEMA 7-19 GM/118ML RE ENEM
1.0000 | ENEMA | Freq: Once | RECTAL | Status: DC | PRN
  Filled 2015-06-28: qty 1

## 2015-06-28 MED ORDER — TRIAMTERENE 50 MG PO CAPS
50.0000 mg | ORAL_CAPSULE | Freq: Every day | ORAL | Status: DC
  Administered 2015-06-29: 50 mg via ORAL
  Filled 2015-06-28: qty 1

## 2015-06-28 MED ORDER — DEXAMETHASONE SODIUM PHOSPHATE 10 MG/ML IJ SOLN
6.0000 mg | Freq: Four times a day (QID) | INTRAMUSCULAR | Status: AC
  Administered 2015-06-29 (×3): 6 mg via INTRAVENOUS
  Filled 2015-06-28 (×4): qty 0.6

## 2015-06-28 NOTE — ED Notes (Signed)
Pt educated on risks of eating prior to surgery; unsure when surgery will be scheduled.

## 2015-06-28 NOTE — ED Provider Notes (Signed)
CRITICAL CARE Performed by: Nat Christen Total critical care time: 30 minutes Critical care time was exclusive of separately billable procedures and treating other patients. Critical care was necessary to treat or prevent imminent or life-threatening deterioration. Critical care was time spent personally by me on the following activities: development of treatment plan with patient and/or surrogate as well as nursing, discussions with consultants, evaluation of patient's response to treatment, examination of patient, obtaining history from patient or surrogate, ordering and performing treatments and interventions, ordering and review of laboratory studies, ordering and review of radiographic studies, pulse oximetry and re-evaluation of patient's condition.  Nat Christen, MD 06/28/15 2139

## 2015-06-28 NOTE — ED Provider Notes (Signed)
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 These findings were discussed with the patient, Bianca Acosta ophthalmologist Bianca Acosta [phone] and the neurosurgeon on-call Bianca Acosta. Patient will be admitted to the neuro ICU.  Bianca Christen, MD 06/28/15 [phone]

## 2015-06-28 NOTE — ED Notes (Signed)
Attempted to call report x2; unsuccessful.

## 2015-06-28 NOTE — ED Notes (Signed)
Pt ambulated to the restroom, without any issues.

## 2015-06-28 NOTE — ED Provider Notes (Signed)
A phone call was received from Dr. Alanda Slim of [facility] regarding the patient's emergency department visit. He has been evaluating her for double vision and diplopia. 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 He is referring to the emergency department for further diagnostic evaluation to rule out cerebral artery aneurysm or venous sinus thrombosis. He requests MRA for arterial study as well as MRV for venous thrombotic pathology.  Charlesetta Shanks, MD 06/28/15 [phone]

## 2015-06-28 NOTE — ED Notes (Signed)
Dr. Cyndy Freeze (neurosurgeon) at the bedside.

## 2015-06-28 NOTE — ED Notes (Signed)
Attempted to call report x1 unsuccessful

## 2015-06-28 NOTE — ED Notes (Signed)
Patient transported to MRI 

## 2015-06-28 NOTE — H&P (Signed)
CC:  Chief Complaint  Patient presents with  . Eye Pain    HPI: Bianca Acosta is a 39 y.o. female with two weeks of headache and progressive right third nerve palsy.  She was evaluated by her ophthalmologist who obtained an MRI/MRA today and discovered an 53mm right pcomm aneurysm.  She denies neurological deficits other than the third nerve palsy.  PMH: Past Medical History  Diagnosis Date  . Hypertension   . Headache     PSH: History reviewed. No pertinent past surgical history.  SH: Social History  Substance Use Topics  . Smoking status: Current Every Day Smoker -- 0.50 packs/day  . Smokeless tobacco: Never Used  . Alcohol Use: No    MEDS: Prior to Admission medications   Medication Sig Start Date End Date Taking? Authorizing Provider  BYSTOLIC 10 MG tablet Take 10 mg by mouth daily. 03/24/15  Yes Historical Provider, MD  predniSONE (DELTASONE) 10 MG tablet Take 20 mg by mouth daily with breakfast.   Yes Historical Provider, MD  sulfamethoxazole-trimethoprim (BACTRIM DS,SEPTRA DS) 800-160 MG tablet Take 1 tablet by mouth 2 (two) times daily.   Yes Historical Provider, MD  triamterene-hydrochlorothiazide (DYAZIDE) 50-25 MG capsule Take 1 capsule by mouth daily. 04/26/15  Yes Historical Provider, MD  HYDROmorphone (DILAUDID) 2 MG tablet Take 1 tablet (2 mg total) by mouth every 12 (twelve) hours as needed for severe pain. Patient not taking: Reported on 06/28/2015 06/18/15   Daymon Larsen, MD  metoCLOPramide (REGLAN) 10 MG tablet Take 1 tablet (10 mg total) by mouth every 8 (eight) hours as needed for nausea. Patient not taking: Reported on 06/28/2015 06/18/15 06/17/16  Daymon Larsen, MD    ALLERGY: Allergies  Allergen Reactions  . Augmentin [Amoxicillin-Pot Clavulanate] Hives  . Ciprofloxacin Itching    Patient states she starts itching from inside or internal.    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  I had a long discussion with her regarding her diagnosis and the risks and benefits of treatment, including clipping, coiling, and stenting.  I favor clipping given her age and the ability to decompress the aneurysm dome.  She wishes to proceed with clipping.  PLAN: - Admit to ICU - Dexamethasone IV - Arteriogram tomorrow - Craniotomy for clipping tomorrow afternoon.

## 2015-06-28 NOTE — ED Provider Notes (Signed)
CSN: [id number]     Arrival date & time 06/28/15  1304 History   First MD Initiated Contact with Patient 06/28/15 1344     Chief Complaint  Patient presents with  . Eye Pain     (Consider location/radiation/quality/duration/timing/severity/associated sxs/prior Treatment) HPI Patient reports she started with a sudden onset of headache at the end of October. Is on the right side of her head. At that time she also developed ocular symptoms of double vision. This has been a progressive problem now with visual change and loss of vision. The patient was seen at the ophthalmologist office today and found to have a cranial nerve III deficit with increased ocular swelling. She is sent to emergency department for diagnostic MRI evaluation. She reports aside from that sudden headache and her ocular symptoms that she has otherwise been well. She has not had fever or signs of infection. She denies any other neurologic dysfunction. There is been no difficulty walking or difficulty using her extremities. Past Medical History  Diagnosis Date  . Hypertension   . Headache    Past Surgical History  Procedure Laterality Date  . Craniotomy Right 06/29/2015    Procedure: CRANIOTOMY INTRACRANIAL ANEURYSM CLIPPING;  Surgeon: Kevan Ny Ditty, MD;  Location: [facility];  Service: Neurosurgery;  Laterality: Right;   History reviewed. No pertinent family history. Social History  Substance Use Topics  . Smoking status: Current Every Day Smoker -- 0.50 packs/day  . Smokeless tobacco: Never Used  . Alcohol Use: No   OB History    No data available     Review of Systems  10 Systems reviewed and are negative for acute change except as noted in the HPI.   Allergies  Augmentin and Ciprofloxacin  Home Medications   Prior to Admission medications   Medication Sig Start Date End Date Taking? Authorizing Provider  BYSTOLIC 10 MG tablet Take 10 mg by mouth daily. 03/24/15  Yes Historical Provider, MD   sulfamethoxazole-trimethoprim (BACTRIM DS,SEPTRA DS) 800-160 MG tablet Take 1 tablet by mouth 2 (two) times daily.   Yes Historical Provider, MD  triamterene-hydrochlorothiazide (DYAZIDE) 50-25 MG capsule Take 1 capsule by mouth daily. 04/26/15  Yes Historical Provider, MD  dexamethasone (DECADRON) 1 MG tablet Take 4mg  by mouth every 8 hours for two days, then 4mg  twice daily for two days, then 4mg  once daily for two days, then 2mg  once daily for two days, then 1mg  daily for two days, then 0.5mg  daily for 5 days 07/01/15   Kevan Ny Ditty, MD  fluconazole (DIFLUCAN) 100 MG tablet Take 2 tablets (200 mg total) by mouth once. Take if yeast infection develops 07/01/15   Kevan Ny Ditty, MD  HYDROcodone-acetaminophen (NORCO/VICODIN) 5-325 MG tablet Take 1 tablet by mouth every 4 (four) hours as needed for moderate pain. 07/01/15   Kevan Ny Ditty, MD  HYDROmorphone (DILAUDID) 2 MG tablet Take 1 tablet (2 mg total) by mouth every 12 (twelve) hours as needed for severe pain. Patient not taking: Reported on 06/28/2015 06/18/15   Daymon Larsen, MD  levETIRAcetam (KEPPRA) 500 MG tablet Take 1 tablet (500 mg total) by mouth 2 (two) times daily. 07/01/15   Kevan Ny Ditty, MD  metoCLOPramide (REGLAN) 10 MG tablet Take 1 tablet (10 mg total) by mouth every 8 (eight) hours as needed for nausea. Patient not taking: Reported on 06/28/2015 06/18/15 06/17/16  Daymon Larsen, MD  pantoprazole (PROTONIX) 20 MG tablet Take 1 tablet (20 mg total) by mouth daily. 07/01/15  Kevan Ny Ditty, MD   BP 117/47 mmHg  Pulse 69  Temp(Src) 98.6 F (37 C) (Oral)  Resp 16  Ht 5\' 4"  (1.626 m)  Wt 245 lb 2.4 oz (111.2 kg)  BMI 42.06 kg/m2  SpO2 100%  LMP 06/19/2015 Physical Exam  Constitutional: She is oriented to person, place, and time. She appears well-developed and well-nourished.  HENT:  Head: Normocephalic and atraumatic.  Right Ear: External ear normal.  Left Ear: External ear normal.   Nose: Nose normal.  Mouth/Throat: Oropharynx is clear and moist.  Eyes:  Right eye is proptotic with a fixed and dilated pupil. There is moderate lateral disconjugate physician. No significant light response and the pupil. Left eye is normal with brisk pupillary responses normal extraocular motions.  Neck: Neck supple.  Cardiovascular: Normal rate, regular rhythm, normal heart sounds and intact distal pulses.   Pulmonary/Chest: Effort normal and breath sounds normal.  Abdominal: Soft. Bowel sounds are normal. She exhibits no distension. There is no tenderness.  Musculoskeletal: Normal range of motion. She exhibits no edema.  Neurological: She is alert and oriented to person, place, and time. She has normal strength. Coordination normal. GCS eye subscore is 4. GCS verbal subscore is 5. GCS motor subscore is 6.  Skin: Skin is warm, dry and intact.  Psychiatric: She has a normal mood and affect.    ED Course  Procedures (including critical care time) Labs Review Labs Reviewed  BASIC METABOLIC PANEL - Abnormal; Notable for the following:    Glucose, Bld 110 (*)    Creatinine, Ser 1.18 (*)    GFR calc non Af Amer 58 (*)    All other components within normal limits  CBC WITH DIFFERENTIAL/PLATELET - Abnormal; Notable for the following:    WBC 12.7 (*)    RBC 5.24 (*)    RDW 15.8 (*)    Neutro Abs 10.3 (*)    All other components within normal limits  BASIC METABOLIC PANEL - Abnormal; Notable for the following:    Glucose, Bld 151 (*)    Creatinine, Ser 1.13 (*)    Calcium 8.7 (*)    All other components within normal limits  CBC - Abnormal; Notable for the following:    WBC 14.2 (*)    RDW 16.1 (*)    All other components within normal limits  GLUCOSE, CAPILLARY - Abnormal; Notable for the following:    Glucose-Capillary 117 (*)    All other components within normal limits  BASIC METABOLIC PANEL - Abnormal; Notable for the following:    Sodium 134 (*)    Glucose, Bld 124 (*)     Calcium 8.4 (*)    All other components within normal limits  CBC - Abnormal; Notable for the following:    WBC 19.8 (*)    RDW 16.5 (*)    All other components within normal limits  MRSA PCR SCREENING  SURGICAL PCR SCREEN  SEDIMENTATION RATE  PROTIME-INR  APTT  TYPE AND SCREEN  ABO/RH  PREPARE RBC (CROSSMATCH)    Imaging Review No results found. I have personally reviewed and evaluated these images and lab results as part of my medical decision-making.   EKG Interpretation None      MDM   Final diagnoses:  Headache  Aneurysm of posterior communicating artery   Patient signed out to Dr. Lacinda Axon for follow-up on MRI MRA results. Patient presents with right-sided proptosis, headache and cranial nerve palsy. She is alert and appropriate with normal mental status  and no other focal neurologic deficit.   Charlesetta Shanks, MD 07/09/15 [phone]

## 2015-06-28 NOTE — ED Notes (Signed)
Pt presents with vision change to R eye since Halloween.  Pt reports she was at work, had sudden onset of R sided headache - went home and fell asleep.  Pt reports upon awakening, she had double vision to R eye  Pt seen at PCP and took prednisone which did not help.  Pt went to opthalmology and referred here for MRI.

## 2015-06-29 ENCOUNTER — Inpatient Hospital Stay (HOSPITAL_COMMUNITY): Payer: BLUE CROSS/BLUE SHIELD

## 2015-06-29 ENCOUNTER — Inpatient Hospital Stay (HOSPITAL_COMMUNITY): Payer: BLUE CROSS/BLUE SHIELD | Admitting: Anesthesiology

## 2015-06-29 ENCOUNTER — Encounter (HOSPITAL_COMMUNITY)
Admission: EM | Disposition: A | Discharge status: Home / Self Care | Payer: Self-pay | Source: Home / Self Care | Attending: Neurological Surgery

## 2015-06-29 ENCOUNTER — Encounter (HOSPITAL_COMMUNITY): Payer: Self-pay | Admitting: Anesthesiology

## 2015-06-29 DIAGNOSIS — I671 Cerebral aneurysm, nonruptured: Secondary | ICD-10-CM

## 2015-06-29 HISTORY — PX: IR GENERIC HISTORICAL: IMG1180011

## 2015-06-29 HISTORY — DX: Cerebral aneurysm, nonruptured: I67.1

## 2015-06-29 HISTORY — PX: CRANIOTOMY: SHX93

## 2015-06-29 LAB — CBC
HEMATOCRIT: 39.4 % (ref 36.0–46.0)
Hemoglobin: 13.2 g/dL (ref 12.0–15.0)
MCH: 26.8 pg (ref 26.0–34.0)
MCHC: 33.5 g/dL (ref 30.0–36.0)
MCV: 79.9 fL (ref 78.0–100.0)
Platelets: 218 10*3/uL (ref 150–400)
RBC: 4.93 MIL/uL (ref 3.87–5.11)
RDW: 16.1 % — AB (ref 11.5–15.5)
WBC: 14.2 10*3/uL — ABNORMAL HIGH (ref 4.0–10.5)

## 2015-06-29 LAB — GLUCOSE, CAPILLARY: GLUCOSE-CAPILLARY: 117 mg/dL — AB (ref 65–99)

## 2015-06-29 LAB — MRSA PCR SCREENING: MRSA by PCR: NEGATIVE

## 2015-06-29 LAB — SURGICAL PCR SCREEN
MRSA, PCR: NEGATIVE
STAPHYLOCOCCUS AUREUS: NEGATIVE

## 2015-06-29 LAB — PREPARE RBC (CROSSMATCH)

## 2015-06-29 LAB — ABO/RH: ABO/RH(D): O POS

## 2015-06-29 SURGERY — CRANIOTOMY, FOR VERTEBRAL/BASILAR ARTERY ANEURYSM REPAIR
Anesthesia: General | Site: Head | Laterality: Right

## 2015-06-29 SURGERY — CRANIOTOMY INTRACRANIAL ANEURYSM FOR CAROTID
Anesthesia: General | Laterality: Right

## 2015-06-29 MED ORDER — LIDOCAINE HCL 1 % IJ SOLN
INTRAMUSCULAR | Status: AC
Start: 2015-06-29 — End: 2015-06-30
  Filled 2015-06-29: qty 20

## 2015-06-29 MED ORDER — FENTANYL CITRATE (PF) 100 MCG/2ML IJ SOLN
INTRAMUSCULAR | Status: DC | PRN
  Administered 2015-06-29: 150 ug via INTRAVENOUS
  Administered 2015-06-29: 100 ug via INTRAVENOUS

## 2015-06-29 MED ORDER — SUCCINYLCHOLINE CHLORIDE 20 MG/ML IJ SOLN
INTRAMUSCULAR | Status: DC | PRN
  Administered 2015-06-29: 140 mg via INTRAVENOUS

## 2015-06-29 MED ORDER — INDOCYANINE GREEN 25 MG IV SOLR
1.2500 mg | Freq: Once | INTRAVENOUS | Status: AC
  Administered 2015-06-29: 12.5 mg via INTRAVENOUS
  Filled 2015-06-29: qty 25

## 2015-06-29 MED ORDER — LIDOCAINE-EPINEPHRINE 2 %-1:100000 IJ SOLN
INTRAMUSCULAR | Status: DC | PRN
  Administered 2015-06-29: 15 mL

## 2015-06-29 MED ORDER — LABETALOL HCL 5 MG/ML IV SOLN
INTRAVENOUS | Status: AC | PRN
  Administered 2015-06-29: 10 mg via INTRAVENOUS

## 2015-06-29 MED ORDER — ROCURONIUM BROMIDE 50 MG/5ML IV SOLN
INTRAVENOUS | Status: AC
Start: 2015-06-29 — End: 2015-06-29
  Filled 2015-06-29: qty 1

## 2015-06-29 MED ORDER — SUGAMMADEX SODIUM 500 MG/5ML IV SOLN
INTRAVENOUS | Status: AC
  Filled 2015-06-29: qty 5

## 2015-06-29 MED ORDER — LIDOCAINE-EPINEPHRINE 2 %-1:100000 IJ SOLN
INTRAMUSCULAR | Status: AC
  Filled 2015-06-29: qty 1

## 2015-06-29 MED ORDER — SODIUM CHLORIDE 0.9 % IV SOLN
INTRAVENOUS | Status: AC | PRN
  Administered 2015-06-29: 30 mL/h via INTRAVENOUS

## 2015-06-29 MED ORDER — MEPERIDINE HCL 25 MG/ML IJ SOLN: 6.2500 mg | INTRAMUSCULAR | Status: DC | PRN

## 2015-06-29 MED ORDER — IOHEXOL 300 MG/ML  SOLN
150.0000 mL | Freq: Once | INTRAMUSCULAR | Status: DC | PRN
Start: 2015-06-29 — End: 2015-06-30
  Administered 2015-06-29: 75 mL via INTRAVENOUS
  Filled 2015-06-29: qty 150

## 2015-06-29 MED ORDER — HEPARIN SOD (PORK) LOCK FLUSH 100 UNIT/ML IV SOLN
INTRAVENOUS | Status: AC
  Filled 2015-06-29: qty 10

## 2015-06-29 MED ORDER — HYDRALAZINE HCL 20 MG/ML IJ SOLN
20.0000 mg | INTRAMUSCULAR | Status: DC | PRN
  Administered 2015-06-29: 20 mg via INTRAVENOUS
  Filled 2015-06-29: qty 1

## 2015-06-29 MED ORDER — 0.9 % SODIUM CHLORIDE (POUR BTL) OPTIME
TOPICAL | Status: DC | PRN
Start: 2015-06-29 — End: 2015-06-29
  Administered 2015-06-29 (×3): 1000 mL

## 2015-06-29 MED ORDER — FENTANYL CITRATE (PF) 100 MCG/2ML IJ SOLN
INTRAMUSCULAR | Status: AC | PRN
  Administered 2015-06-29: 25 ug via INTRAVENOUS

## 2015-06-29 MED ORDER — PROPOFOL 10 MG/ML IV BOLUS
INTRAVENOUS | Status: DC | PRN
  Administered 2015-06-29: 200 mg via INTRAVENOUS

## 2015-06-29 MED ORDER — SUGAMMADEX SODIUM 500 MG/5ML IV SOLN
INTRAVENOUS | Status: DC | PRN
  Administered 2015-06-29: 500 mg via INTRAVENOUS

## 2015-06-29 MED ORDER — HEMOSTATIC AGENTS (NO CHARGE) OPTIME
TOPICAL | Status: DC | PRN
  Administered 2015-06-29: 1 via TOPICAL

## 2015-06-29 MED ORDER — FENTANYL CITRATE (PF) 100 MCG/2ML IJ SOLN
INTRAMUSCULAR | Status: AC
  Filled 2015-06-29: qty 2

## 2015-06-29 MED ORDER — MIDAZOLAM HCL 2 MG/2ML IJ SOLN
INTRAMUSCULAR | Status: AC | PRN
  Administered 2015-06-29: 1 mg via INTRAVENOUS

## 2015-06-29 MED ORDER — LABETALOL HCL 5 MG/ML IV SOLN
INTRAVENOUS | Status: AC
  Filled 2015-06-29: qty 4

## 2015-06-29 MED ORDER — MIDAZOLAM HCL 2 MG/2ML IJ SOLN
INTRAMUSCULAR | Status: AC
  Filled 2015-06-29: qty 2

## 2015-06-29 MED ORDER — FENTANYL CITRATE (PF) 250 MCG/5ML IJ SOLN
INTRAMUSCULAR | Status: AC
  Filled 2015-06-29: qty 5

## 2015-06-29 MED ORDER — PROPOFOL 10 MG/ML IV BOLUS
INTRAVENOUS | Status: AC
  Filled 2015-06-29: qty 20

## 2015-06-29 MED ORDER — SODIUM CHLORIDE 0.9 % IV SOLN
INTRAVENOUS | Status: DC | PRN
  Administered 2015-06-29: 19:00:00 via INTRAVENOUS

## 2015-06-29 MED ORDER — PAPAVERINE HCL 30 MG/ML IJ SOLN
60.0000 mg | INTRAMUSCULAR | Status: DC
  Filled 2015-06-29: qty 2

## 2015-06-29 MED ORDER — SODIUM CHLORIDE 0.9 % IV SOLN: Freq: Once | INTRAVENOUS | Status: DC

## 2015-06-29 MED ORDER — MANNITOL 25 % IV SOLN
INTRAVENOUS | Status: AC
  Filled 2015-06-29: qty 100

## 2015-06-29 MED ORDER — ROCURONIUM BROMIDE 50 MG/5ML IV SOLN
INTRAVENOUS | Status: AC
  Filled 2015-06-29: qty 2

## 2015-06-29 MED ORDER — SUCCINYLCHOLINE CHLORIDE 20 MG/ML IJ SOLN
INTRAMUSCULAR | Status: AC
  Filled 2015-06-29: qty 1

## 2015-06-29 MED ORDER — PROMETHAZINE HCL 25 MG/ML IJ SOLN: 6.2500 mg | INTRAMUSCULAR | Status: DC | PRN

## 2015-06-29 MED ORDER — MANNITOL 25 % IV SOLN
100.0000 g | INTRAVENOUS | Status: AC
  Administered 2015-06-29: 100 g via INTRAVENOUS
  Filled 2015-06-29: qty 400

## 2015-06-29 MED ORDER — LACTATED RINGERS IV SOLN
INTRAVENOUS | Status: DC | PRN
  Administered 2015-06-29: 21:00:00 via INTRAVENOUS

## 2015-06-29 MED ORDER — PHENYLEPHRINE HCL 10 MG/ML IJ SOLN
10.0000 mg | INTRAVENOUS | Status: DC | PRN
  Administered 2015-06-29: 25 ug/min via INTRAVENOUS

## 2015-06-29 MED ORDER — LIDOCAINE HCL 4 % MT SOLN
OROMUCOSAL | Status: DC | PRN
  Administered 2015-06-29: 4 mL via TOPICAL

## 2015-06-29 MED ORDER — ONDANSETRON HCL 4 MG/2ML IJ SOLN
4.0000 mg | Freq: Once | INTRAMUSCULAR | Status: DC | PRN
Start: 2015-06-29 — End: 2015-06-30

## 2015-06-29 MED ORDER — STERILE WATER FOR INJECTION IJ SOLN
INTRAMUSCULAR | Status: AC
  Filled 2015-06-29: qty 20

## 2015-06-29 MED ORDER — MIDAZOLAM HCL 2 MG/2ML IJ SOLN
INTRAMUSCULAR | Status: AC
Start: 2015-06-29 — End: 2015-06-30
  Filled 2015-06-29: qty 2

## 2015-06-29 MED ORDER — THROMBIN 20000 UNITS EX SOLR
CUTANEOUS | Status: DC | PRN
  Administered 2015-06-29: 22:00:00 via TOPICAL

## 2015-06-29 MED ORDER — LIDOCAINE HCL (CARDIAC) 20 MG/ML IV SOLN
INTRAVENOUS | Status: DC | PRN
  Administered 2015-06-29: 80 mg via INTRAVENOUS

## 2015-06-29 MED ORDER — MIDAZOLAM HCL 2 MG/2ML IJ SOLN
INTRAMUSCULAR | Status: DC | PRN
  Administered 2015-06-29: 2 mg via INTRAVENOUS

## 2015-06-29 MED ORDER — HYDROMORPHONE HCL 1 MG/ML IJ SOLN: 0.2500 mg | INTRAMUSCULAR | Status: DC | PRN

## 2015-06-29 MED ORDER — VANCOMYCIN HCL IN DEXTROSE 1-5 GM/200ML-% IV SOLN
INTRAVENOUS | Status: AC
  Administered 2015-06-29: 1000 mg via INTRAVENOUS
  Filled 2015-06-29: qty 200

## 2015-06-29 MED ORDER — HEPARIN SODIUM (PORCINE) 1000 UNIT/ML IJ SOLN
INTRAMUSCULAR | Status: AC
  Filled 2015-06-29: qty 1

## 2015-06-29 MED ORDER — ONDANSETRON HCL 4 MG/2ML IJ SOLN
INTRAMUSCULAR | Status: AC
Start: 2015-06-29 — End: 2015-06-29
  Filled 2015-06-29: qty 2

## 2015-06-29 MED ORDER — THROMBIN 5000 UNITS EX SOLR
OROMUCOSAL | Status: DC | PRN
  Administered 2015-06-29: 22:00:00 via TOPICAL

## 2015-06-29 MED ORDER — VANCOMYCIN HCL 10 G IV SOLR
1500.0000 mg | INTRAVENOUS | Status: DC
  Filled 2015-06-29: qty 1500

## 2015-06-29 MED ORDER — SODIUM CHLORIDE 0.9 % IR SOLN
Status: DC | PRN
  Administered 2015-06-29: 21:00:00

## 2015-06-29 MED ORDER — PANTOPRAZOLE SODIUM 40 MG IV SOLR
INTRAVENOUS | Status: AC
  Administered 2015-06-29: 01:00:00
  Filled 2015-06-29: qty 40

## 2015-06-29 MED ORDER — BUPIVACAINE-EPINEPHRINE (PF) 0.5% -1:200000 IJ SOLN
INTRAMUSCULAR | Status: DC | PRN
  Administered 2015-06-29: 15 mL

## 2015-06-29 MED ORDER — ROCURONIUM BROMIDE 100 MG/10ML IV SOLN
INTRAVENOUS | Status: DC | PRN
  Administered 2015-06-29: 30 mg via INTRAVENOUS
  Administered 2015-06-29 (×2): 50 mg via INTRAVENOUS
  Administered 2015-06-29: 20 mg via INTRAVENOUS

## 2015-06-29 MED ORDER — PAPAVERINE HCL 30 MG/ML IJ SOLN
INTRAMUSCULAR | Status: DC | PRN
  Administered 2015-06-29: 60 mg via INTRAVENOUS

## 2015-06-29 MED ORDER — FUROSEMIDE 10 MG/ML IJ SOLN
INTRAMUSCULAR | Status: AC
  Administered 2015-06-29: 10 mg via INTRAMUSCULAR
  Filled 2015-06-29: qty 4

## 2015-06-29 SURGICAL SUPPLY — 84 items
APPLICATOR CHLORAPREP 3ML ORNG (MISCELLANEOUS) ×4 IMPLANT
BANDAGE GAUZE 4  KLING STR (GAUZE/BANDAGES/DRESSINGS) ×2 IMPLANT
BATTERY IQ STERILE (MISCELLANEOUS) ×2 IMPLANT
BENZOIN TINCTURE PRP APPL 2/3 (GAUZE/BANDAGES/DRESSINGS) IMPLANT
BLADE CLIPPER SURG (BLADE) ×2 IMPLANT
BLADE ULTRA TIP 2M (BLADE) IMPLANT
BNDG GAUZE ELAST 4 BULKY (GAUZE/BANDAGES/DRESSINGS) ×4 IMPLANT
BRUSH SCRUB EZ 1% IODOPHOR (MISCELLANEOUS) IMPLANT
BUR ACORN 6.0 PRECISION (BURR) ×2 IMPLANT
BUR ADDG 1.1 (BURR) IMPLANT
BUR MATCHSTICK NEURO 3.0 LAGG (BURR) IMPLANT
BUR SPIRAL ROUTER 2.3 (BUR) IMPLANT
CANISTER SUCT 3000ML PPV (MISCELLANEOUS) ×4 IMPLANT
CATH ROBINSON RED A/P 14FR (CATHETERS) IMPLANT
CLIP ANEURY TI PERM STD 6.5 (Clip) ×2 IMPLANT
CLIP TI MEDIUM 6 (CLIP) IMPLANT
DRAIN JACKSON PRATT 10MM FLAT (MISCELLANEOUS) ×2 IMPLANT
DRAIN SNY WOU 7FLT (WOUND CARE) IMPLANT
DRAPE NEUROLOGICAL W/INCISE (DRAPES) ×2 IMPLANT
DRAPE SHEET LG 3/4 BI-LAMINATE (DRAPES) ×6 IMPLANT
DRAPE SURG 17X23 STRL (DRAPES) IMPLANT
DRAPE WARM FLUID 44X44 (DRAPE) ×2 IMPLANT
DRSG MEPILEX BORDER 4X12 (GAUZE/BANDAGES/DRESSINGS) ×2 IMPLANT
ELECT REM PT RETURN 9FT ADLT (ELECTROSURGICAL) ×2
ELECTRODE REM PT RTRN 9FT ADLT (ELECTROSURGICAL) ×1 IMPLANT
EVACUATOR 1/8 PVC DRAIN (DRAIN) ×2 IMPLANT
EVACUATOR SILICONE 100CC (DRAIN) ×2 IMPLANT
FORCEPS BIPOLAR SPETZLER 8 1.0 (NEUROSURGERY SUPPLIES) ×4 IMPLANT
GAUZE SPONGE 4X4 12PLY STRL (GAUZE/BANDAGES/DRESSINGS) IMPLANT
GAUZE SPONGE 4X4 16PLY XRAY LF (GAUZE/BANDAGES/DRESSINGS) IMPLANT
GLOVE BIOGEL PI IND STRL 7.5 (GLOVE) ×5 IMPLANT
GLOVE BIOGEL PI INDICATOR 7.5 (GLOVE) ×5
GLOVE ECLIPSE 7.0 STRL STRAW (GLOVE) ×2 IMPLANT
GLOVE SS BIOGEL STRL SZ 7 (GLOVE) ×2 IMPLANT
GLOVE SUPERSENSE BIOGEL SZ 7 (GLOVE) ×2
GOWN STRL REUS W/ TWL LRG LVL3 (GOWN DISPOSABLE) ×2 IMPLANT
GOWN STRL REUS W/ TWL XL LVL3 (GOWN DISPOSABLE) ×2 IMPLANT
GOWN STRL REUS W/TWL 2XL LVL3 (GOWN DISPOSABLE) IMPLANT
GOWN STRL REUS W/TWL LRG LVL3 (GOWN DISPOSABLE) ×2
GOWN STRL REUS W/TWL XL LVL3 (GOWN DISPOSABLE) ×2
HEMOSTAT POWDER KIT SURGIFOAM (HEMOSTASIS) ×2 IMPLANT
HEMOSTAT SURGICEL 2X14 (HEMOSTASIS) ×2 IMPLANT
KIT BASIN OR (CUSTOM PROCEDURE TRAY) ×2 IMPLANT
KIT ROOM TURNOVER OR (KITS) ×2 IMPLANT
KNIFE ARACHNOID DISP AM-24-S (MISCELLANEOUS) ×2 IMPLANT
NEEDLE HYPO 25X1 1.5 SAFETY (NEEDLE) ×2 IMPLANT
NS IRRIG 1000ML POUR BTL (IV SOLUTION) ×4 IMPLANT
PACK CRANIOTOMY (CUSTOM PROCEDURE TRAY) ×2 IMPLANT
PATTIES SURGICAL .5 X.5 (GAUZE/BANDAGES/DRESSINGS) IMPLANT
PATTIES SURGICAL .5 X3 (DISPOSABLE) IMPLANT
PATTIES SURGICAL 1X1 (DISPOSABLE) IMPLANT
PIN MAYFIELD SKULL DISP (PIN) ×2 IMPLANT
PLATE 1.5  2HOLE LNG NEURO (Plate) ×3 IMPLANT
PLATE 1.5 2HOLE LNG NEURO (Plate) ×3 IMPLANT
SCREW SELF DRILL HT 1.5/4MM (Screw) ×12 IMPLANT
SET CRAINOPLASTY (SET/KITS/TRAYS/PACK) ×2 IMPLANT
SPONGE NEURO XRAY DETECT 1X3 (DISPOSABLE) IMPLANT
SPONGE SURGIFOAM ABS GEL 100 (HEMOSTASIS) ×2 IMPLANT
SPONGE SURGIFOAM ABS GEL 100C (HEMOSTASIS) ×2 IMPLANT
STAPLER VISISTAT 35W (STAPLE) ×2 IMPLANT
STOCKINETTE 6  STRL (DRAPES)
STOCKINETTE 6 STRL (DRAPES) IMPLANT
SURGICAL PATTIES 1 X 6 ×2 IMPLANT
SURGICAL PATTIES 1/2 X 1 1/2 ×2 IMPLANT
SURGICAL PATTIES 1/2 X 6 ×2 IMPLANT
SURGICAL PATTIES 1/4 X 6 ×2 IMPLANT
SURGICAL PATTIES 3 X 6 ×2 IMPLANT
SURGICAL PATTIES 3/4 X 6 ×2 IMPLANT
SUT ETHILON 3 0 FSL (SUTURE) ×2 IMPLANT
SUT ETHILON 3 0 PS 1 (SUTURE) IMPLANT
SUT NURALON 4 0 TR CR/8 (SUTURE) ×4 IMPLANT
SUT VIC AB 0 CT1 18XCR BRD8 (SUTURE) ×2 IMPLANT
SUT VIC AB 0 CT1 8-18 (SUTURE) ×2
SUT VIC AB 2-0 CT1 18 (SUTURE) ×4 IMPLANT
SUT VIC AB 3-0 SH 8-18 (SUTURE) ×2 IMPLANT
SYR 30ML LL (SYRINGE) ×2 IMPLANT
SYR TB 1ML 25GX5/8 (SYRINGE) ×2 IMPLANT
TAPE CLOTH SURG 4X10 WHT LF (GAUZE/BANDAGES/DRESSINGS) ×2 IMPLANT
TOWEL OR 17X24 6PK STRL BLUE (TOWEL DISPOSABLE) ×2 IMPLANT
TOWEL OR 17X26 10 PK STRL BLUE (TOWEL DISPOSABLE) ×2 IMPLANT
TRAY FOLEY W/METER SILVER 14FR (SET/KITS/TRAYS/PACK) ×2 IMPLANT
TUBE CONNECTING 12X1/4 (SUCTIONS) ×2 IMPLANT
UNDERPAD 30X30 INCONTINENT (UNDERPADS AND DIAPERS) IMPLANT
WATER STERILE IRR 1000ML POUR (IV SOLUTION) ×2 IMPLANT

## 2015-06-29 NOTE — Sedation Documentation (Addendum)
Right groin stick with lidocaine by Dr Kathyrn Sheriff

## 2015-06-29 NOTE — Sedation Documentation (Signed)
Pt wearing eye patch on right, eye lid is swollen closed, pt reports double vision when open.  Denies pain.

## 2015-06-29 NOTE — Progress Notes (Signed)
No issues overnight. Pts history reviewed. Has had diplopia, ptosis, and proptosis for a few weeks. Has hx of HTN, and is a tobacco smoker.   EXAM:  BP 168/96 mmHg  Pulse 78  Temp(Src) 98 F (36.7 C) (Oral)  Resp 21  Ht 5\' 4"  (1.626 m)  Wt 110.7 kg (244 lb 0.8 oz)  BMI 41.87 kg/m2  SpO2 98%  LMP 06/19/2015  Awake, alert, oriented  Speech fluent, appropriate  CN grossly intact x right CNIII palsy 5/5 BUE/BLE   IMAGING: MRA demonstrates ~83mm right Pcom aneurysm  IMPRESSION:  39 y.o. 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  PLAN: - Diagnostic cerebral angiogram later this pm prior to planned surgical clipping  The risks of the angiogram were reviewed to include stroke, hemorrhage, groin hematoma, contrast reaction, and kidney damage. The patient our discussion and she provided consent to proceed with diagnostic angiogram. All questions were answered.

## 2015-06-29 NOTE — Progress Notes (Signed)
No acute events AVSS Awake and alert, oriented Right 3rd nerve palsy Full strength Stable Ready for OR tonight after arteriogram Plan right craniotomy for clipping of pcomm aneurysm Again discussed risks and benefits.  Explained that if 3rd nerve recovers it will probably be delayed by 3 or more months

## 2015-06-29 NOTE — Sedation Documentation (Signed)
O2 d/c'd 

## 2015-06-29 NOTE — Sedation Documentation (Signed)
Right groin exoseal closure by Don Broach, RT

## 2015-06-29 NOTE — Sedation Documentation (Signed)
Access achieved right groin

## 2015-06-29 NOTE — Anesthesia Preprocedure Evaluation (Addendum)
Anesthesia Evaluation  Patient identified by MRN, date of birth, ID band Patient awake    Reviewed: Allergy & Precautions, NPO status , Patient's Chart, lab work & pertinent test results  Airway Mallampati: II  TM Distance: >3 FB Neck ROM: Full    Dental no notable dental hx.    Pulmonary Current Smoker,    Pulmonary exam normal breath sounds clear to auscultation       Cardiovascular hypertension, Pt. on medications + Peripheral Vascular Disease  Normal cardiovascular exam Rhythm:Regular Rate:Normal     Neuro/Psych  Headaches,    GI/Hepatic   Endo/Other  Morbid obesity  Renal/GU      Musculoskeletal   Abdominal   Peds  Hematology   Anesthesia Other Findings   Reproductive/Obstetrics                            Anesthesia Physical Anesthesia Plan  ASA: III  Anesthesia Plan: General   Post-op Pain Management:    Induction: Intravenous  Airway Management Planned: Oral ETT  Additional Equipment: Arterial line  Intra-op Plan:   Post-operative Plan: Extubation in OR  Informed Consent: I have reviewed the patients History and Physical, chart, labs and discussed the procedure including the risks, benefits and alternatives for the proposed anesthesia with the patient or authorized representative who has indicated his/her understanding and acceptance.   Dental advisory given  Plan Discussed with: CRNA, Anesthesiologist and Surgeon  Anesthesia Plan Comments:        Anesthesia Quick Evaluation

## 2015-06-29 NOTE — Progress Notes (Signed)
Awake and alert Follows commands bilaterally Stable right CN 3 palsy Doing well

## 2015-06-29 NOTE — Sedation Documentation (Signed)
BP reported to MD

## 2015-06-29 NOTE — Op Note (Signed)
06/29/2015  5:24 PM  PATIENT:  Bianca Acosta  39 y.o. female  PRE-OPERATIVE DIAGNOSIS:  Unruptured right posterior communicating artery aneurysm, right cranial nerve 3 palsy  POST-OPERATIVE DIAGNOSIS:  Same  PROCEDURE:  Right craniotomy for clipping of carotid circulation aneurysm  SURGEON:  Cyndy Freeze, MD  ASSISTANTS: Consuella Lose, MD  ANESTHESIA:   General  DRAINS: Subgaleal JP  SPECIMEN:  None  INDICATION FOR PROCEDURE: 39 year old woman with recent onset right cranial nerve 3 palsy.  Her ophthalmologist referred her for an MRI/MRA of the brain yesterday which revealed a right posterior communicating artery aneurysm.  Patient understood the risks, benefits, and alternatives and potential outcomes and wished to proceed.  PROCEDURE DETAILS: After smooth induction of general endotracheal anesthesia the patient was transferred to the operative table.  The head was fixated to the table using Mayfield pins.  The right frontotemporal area was clipped of hair and wiped with alcohol.  Lidocaine and marcaine with epinephrine was injected along the line of the planned incision.  The patient was then prepped and draped in the usual sterile fashion.  The right neck was prepped and draped as well.  A timeout was performed.  A curvilinear frontotemporal incision was made and a musculocutaneous flap was reflected anteriorly.  A small pterional craniotomy was fashioned using a matchstick burr.  The lesser wing of the sphenoid to the level of the meningoorbital band as well as the orbital roof were drilled flat to enhance exposure.  The dura was then opened in a curved fashion and reflected anteriorly.  The frontal lobe was elevated to identify the optic nerve entering the optic canal canal.  The arachnoid cisterns were widely opened and CSF was removed to produce brain relaxation.  The carotid artery was then identified.  Dissection of the sylvian fissure was performed using sharp  dissection and bipolar cautery.  Attention was then turned to the carotid artery and the MCA, ACA, aneurysm and anterior choroidal artery were identified.  The neck of the aneurysm was dissected and the posterior communicating artery was identified.  A gently curved clip was applied across the neck of the aneurysm.  I then inspected the posterior communicating artery and third cranial nerve to be sure they were not included in the clip.  ICG angiography was performed which showed obliteration of the aneurysm and patency of the ICA, posterior communicating artery, and anterior choroidal artery.  The aneurysm was then sharply opened to decompress the third cranial nerve.  I irrigated vigorously and obtained meticulous hemostasis.  Gelfoam with papaverine was placed around the ICA.  Gelfoam with thrombin was layered in the sylvian fissure.  The bone flap was secured using titanium fixation plates.  Methylmethacrylate was used to repair the craniectomy defects.  A drain was placed.  Further irrigation was performed and the temporals and galea were closed with interrupted vicryl sutures.  The skin was closed with staples.  The patient was taken out of Mayfield pins.  She awoke from anesthesia without new apparent neurological deficits.  She is transferred to the ICU.  Counts were correct.  PATIENT DISPOSITION:  ICU   Delay start of Pharmacological VTE agent (>24hrs) due to surgical blood loss or risk of bleeding:  Yes

## 2015-06-29 NOTE — Op Note (Signed)
DIAGNOSTIC CEREBRAL ANGIOGRAM    OPERATOR:   Dr. Consuella Lose, MD  HISTORY:   The patient is a 39 y.o. yo female presenting with right third nerve palsy. MRA demonstrated a right posterior communicating artery aneurysm. She presents for further workup with diagnostic cerebral angiogram.  APPROACH:   The technical aspects of the procedure as well as its potential risks and benefits were reviewed with the patient. These risks included but were not limited bleeding, infection, allergic reaction, damage to organs/vital structures, stroke, non-diagnostic procedure, and the catastrophic outcomes of heart attack, coma, and death. With an understanding of these risks, informed consent was obtained and witnessed.    The patient was placed in the supine position on the angiography table and the skin of right groin prepped in the usual sterile fashion. The procedure was performed under local anesthesia (1%-solution of bicarbonate-bufferred Lidoacaine) and conscious sedation with Versed and fentanyl monitored by the in-suite nurse.    A 5- French sheath was introduced in the right common femoral artery using Seldinger technique.  A fluorophase sequence was used to document the sheath position.    HEPARIN: 0 Units total.   CONTRAST AGENT: 75cc, Omnipaque 300   FLUOROSCOPY TIME: 7.4 combined AP and lateral minutes    CATHETER(S) AND WIRE(S):    5-French JB-1 glidecatheter   0.035" glidewire    VESSELS CATHETERIZED:   Right internal carotid   Left common carotid   Left vertebral   Right common femoral  VESSELS STUDIED:   Right internal carotid, head Right internal carotid, 3D rotation Left common carotid, head Left vertebral Right femoral  PROCEDURAL NARRATIVE:   A 5-Fr JB-1 terumo glide catheter was advanced over a 0.035 glidewire into the aortic arch. The above vessels were then sequentially catheterized and cervical/cerebral angiograms taken. After review of images, the catheter was  removed without incident.    INTERPRETATION:   Right internal carotid: head:   Injection reveals the presence of a widely patent ICA, M1, and A1 segments and their branches. There is a posteriorly directed aneurysm arising in the communicating segment of the right ICA better delineated on the 3D angiogram. Aneurysm measures approximately 8.57mm in maximal dimension. The parenchymal and venous phases are normal. The venous sinuses are widely patent.    Right internal carotid, 3D rotation 3-dimensional rotational angiographic images were reconstructed on [employer] workstation for review. These further delineate the posteriorly directed posterior communicating artery aneurysm. The aneurysm neck appears to arise between the origin of the posterior communicating and anterior choroidal arteries.  Left common carotid: head:   Injection reveals the presence of a widely patent ICA, A1, and M1 segments and their branches. There is no significant stenosis, occlusion, aneurysm, or high flow vascular malformation visualized. The parenchymal and venous phases are normal. The venous sinuses are widely patent.    Left vertebral:   Injection reveals the presence of a widely patent vertebral artery. This leads to a widely patent basilar artery that terminates in bilateral P1. The basilar apex is normal. There is no significant stenosis, occlusion, aneurysm, or vascular malformation visualized. The parenchymal and venous phases are normal. The venous sinuses are widely patent.    Right femoral:    Normal vessel. No significant atherosclerotic disease. Arterial sheath in adequate position.   DISPOSITION:  Upon completion of the study, the femoral sheath was removed and hemostasis obtained using a 5-Fr ExoSeal closure device. Good proximal and distal lower extremity pulses were documented upon achievement of hemostasis.  The procedure was well tolerated and no early complications were observed.       The  patient was transferred back to the holding area to be positioned flat in bed for 3 hours of observation.    IMPRESSION:  1. 73mm right posterior communicating artery aneurysm as described above 2. No other intracranial aneurysms, arteriovenous malformations, or high flow fistulas are identified.  The preliminary results of this procedure were shared with the patient and the patient's family.

## 2015-06-29 NOTE — Sedation Documentation (Signed)
Groin assessed again with same soft swelling.  Tech believes it is just pt's tissue.  Soft no signs of hematoma.  Pulse strong.  Denies pain.  REady for Tx to neuro pacu.

## 2015-06-29 NOTE — Sedation Documentation (Signed)
Small amount (quarter size) of soft swelling noted at bottom end of dressing pressure held again by Don Broach, RT, pulse intact,  Dressing CDI

## 2015-06-29 NOTE — Sedation Documentation (Signed)
Continuing to hold pressure 

## 2015-06-29 NOTE — Sedation Documentation (Signed)
O2 2l/Hawi turned on

## 2015-06-29 NOTE — Progress Notes (Signed)
   06/29/15 1400  Clinical Encounter Type  Visited With Patient and family together  Visit Type Initial;Other (Comment)  Referral From [facility] has visited with Pt and family; chaplain had AD finalized and notarized; chaplain upon request as needed

## 2015-06-29 NOTE — Sedation Documentation (Signed)
Pt prepped and ready for procedure, multiple questions answered about procedure and sedation.  Awaiting MD.

## 2015-06-29 NOTE — Transfer of Care (Signed)
Immediate Anesthesia Transfer of Care Note  Patient: Bianca Acosta  Procedure(s) Performed: Procedure(s): CRANIOTOMY INTRACRANIAL ANEURYSM CLIPPING (Right)  Patient Location: PACU  Anesthesia Type:General  Level of Consciousness: awake, alert  and oriented  Airway & Oxygen Therapy: Patient connected to nasal cannula oxygen  Post-op Assessment: Report given to RN, Post -op Vital signs reviewed and stable and Patient moving all extremities X 4  Post vital signs: Reviewed and stable  Last Vitals:  Filed Vitals:   06/29/15 2345  BP:   Pulse: 68  Temp:   Resp: 27    Complications: No apparent anesthesia complications

## 2015-06-29 NOTE — Anesthesia Procedure Notes (Signed)
Procedure Name: Intubation Date/Time: 06/29/2015 7:36 PM Performed by: Valetta Fuller Pre-anesthesia Checklist: Patient identified, Emergency Drugs available, Suction available and Patient being monitored Patient Re-evaluated:Patient Re-evaluated prior to inductionOxygen Delivery Method: Circle system utilized Preoxygenation: Pre-oxygenation with 100% oxygen Intubation Type: IV induction Laryngoscope Size: Miller and 2 Grade View: Grade III Tube type: Subglottic suction tube Tube size: 7.0 mm Number of attempts: 1 Airway Equipment and Method: Stylet Placement Confirmation: ETT inserted through vocal cords under direct vision,  positive ETCO2 and breath sounds checked- equal and bilateral Secured at: 22 cm Tube secured with: Tape Dental Injury: Teeth and Oropharynx as per pre-operative assessment

## 2015-06-29 NOTE — Progress Notes (Signed)
Utilization Review Completed.Donne Anon T11/16/2016

## 2015-06-30 ENCOUNTER — Encounter (HOSPITAL_COMMUNITY): Payer: Self-pay | Admitting: Neurological Surgery

## 2015-06-30 ENCOUNTER — Inpatient Hospital Stay (HOSPITAL_COMMUNITY): Payer: BLUE CROSS/BLUE SHIELD

## 2015-06-30 LAB — BASIC METABOLIC PANEL
Anion gap: 9 (ref 5–15)
BUN: 14 mg/dL (ref 6–20)
CHLORIDE: 101 mmol/L (ref 101–111)
CO2: 24 mmol/L (ref 22–32)
Calcium: 8.4 mg/dL — ABNORMAL LOW (ref 8.9–10.3)
Creatinine, Ser: 0.98 mg/dL (ref 0.44–1.00)
Glucose, Bld: 124 mg/dL — ABNORMAL HIGH (ref 65–99)
POTASSIUM: 3.7 mmol/L (ref 3.5–5.1)
SODIUM: 134 mmol/L — AB (ref 135–145)

## 2015-06-30 LAB — CBC
HEMATOCRIT: 36.9 % (ref 36.0–46.0)
Hemoglobin: 12.5 g/dL (ref 12.0–15.0)
MCH: 26.7 pg (ref 26.0–34.0)
MCHC: 33.9 g/dL (ref 30.0–36.0)
MCV: 78.8 fL (ref 78.0–100.0)
Platelets: 211 10*3/uL (ref 150–400)
RBC: 4.68 MIL/uL (ref 3.87–5.11)
RDW: 16.5 % — AB (ref 11.5–15.5)
WBC: 19.8 10*3/uL — AB (ref 4.0–10.5)

## 2015-06-30 MED ORDER — FLEET ENEMA 7-19 GM/118ML RE ENEM: 1.0000 | ENEMA | Freq: Once | RECTAL | Status: DC | PRN

## 2015-06-30 MED ORDER — DEXAMETHASONE SODIUM PHOSPHATE 4 MG/ML IJ SOLN
4.0000 mg | Freq: Four times a day (QID) | INTRAMUSCULAR | Status: DC
  Administered 2015-06-30 – 2015-07-01 (×6): 4 mg via INTRAVENOUS
  Filled 2015-06-30 (×6): qty 1

## 2015-06-30 MED ORDER — VANCOMYCIN HCL 10 G IV SOLR
1500.0000 mg | Freq: Once | INTRAVENOUS | Status: AC
  Administered 2015-06-30: 1500 mg via INTRAVENOUS
  Filled 2015-06-30: qty 1500

## 2015-06-30 MED ORDER — SENNOSIDES-DOCUSATE SODIUM 8.6-50 MG PO TABS: 1.0000 | ORAL_TABLET | Freq: Every evening | ORAL | Status: DC | PRN

## 2015-06-30 MED ORDER — LABETALOL HCL 5 MG/ML IV SOLN
10.0000 mg | INTRAVENOUS | Status: DC | PRN
  Administered 2015-06-30: 20 mg via INTRAVENOUS
  Filled 2015-06-30: qty 4

## 2015-06-30 MED ORDER — SODIUM CHLORIDE 0.9 % IV SOLN
500.0000 mg | Freq: Two times a day (BID) | INTRAVENOUS | Status: DC
  Administered 2015-06-30: 500 mg via INTRAVENOUS
  Filled 2015-06-30 (×3): qty 5

## 2015-06-30 MED ORDER — HYDROMORPHONE HCL 1 MG/ML IJ SOLN
0.5000 mg | INTRAMUSCULAR | Status: DC | PRN
  Administered 2015-06-30 (×2): 1 mg via INTRAVENOUS
  Administered 2015-06-30 (×2): 0.5 mg via INTRAVENOUS
  Administered 2015-06-30: 1 mg via INTRAVENOUS
  Filled 2015-06-30 (×4): qty 1

## 2015-06-30 MED ORDER — PROMETHAZINE HCL 25 MG PO TABS: 12.5000 mg | ORAL_TABLET | ORAL | Status: DC | PRN

## 2015-06-30 MED ORDER — ONDANSETRON HCL 4 MG PO TABS: 4.0000 mg | ORAL_TABLET | ORAL | Status: DC | PRN

## 2015-06-30 MED ORDER — ACETAMINOPHEN 650 MG RE SUPP: 650.0000 mg | RECTAL | Status: DC | PRN

## 2015-06-30 MED ORDER — PANTOPRAZOLE SODIUM 40 MG IV SOLR
40.0000 mg | Freq: Every day | INTRAVENOUS | Status: DC
  Administered 2015-06-30 (×2): 40 mg via INTRAVENOUS
  Filled 2015-06-30 (×2): qty 40

## 2015-06-30 MED ORDER — BISACODYL 5 MG PO TBEC: 5.0000 mg | DELAYED_RELEASE_TABLET | Freq: Every day | ORAL | Status: DC | PRN

## 2015-06-30 MED ORDER — SENNA 8.6 MG PO TABS
1.0000 | ORAL_TABLET | Freq: Two times a day (BID) | ORAL | Status: DC
  Administered 2015-06-30 – 2015-07-01 (×3): 8.6 mg via ORAL
  Filled 2015-06-30 (×3): qty 1

## 2015-06-30 MED ORDER — HYDROMORPHONE HCL 1 MG/ML IJ SOLN
INTRAMUSCULAR | Status: AC
  Administered 2015-06-30: 0.5 mg via INTRAVENOUS
  Filled 2015-06-30: qty 1

## 2015-06-30 MED ORDER — ONDANSETRON HCL 4 MG/2ML IJ SOLN: 4.0000 mg | INTRAMUSCULAR | Status: DC | PRN

## 2015-06-30 MED ORDER — LEVETIRACETAM 500 MG PO TABS
500.0000 mg | ORAL_TABLET | Freq: Two times a day (BID) | ORAL | Status: DC
  Administered 2015-06-30 – 2015-07-01 (×3): 500 mg via ORAL
  Filled 2015-06-30 (×3): qty 1

## 2015-06-30 MED ORDER — SODIUM CHLORIDE 0.9 % IV SOLN
INTRAVENOUS | Status: DC
  Administered 2015-06-30 – 2015-07-01 (×4): via INTRAVENOUS

## 2015-06-30 MED ORDER — DOCUSATE SODIUM 100 MG PO CAPS
100.0000 mg | ORAL_CAPSULE | Freq: Two times a day (BID) | ORAL | Status: DC
  Administered 2015-06-30 – 2015-07-01 (×3): 100 mg via ORAL
  Filled 2015-06-30 (×3): qty 1

## 2015-06-30 MED ORDER — HYDROCODONE-ACETAMINOPHEN 5-325 MG PO TABS
1.0000 | ORAL_TABLET | ORAL | Status: DC | PRN
  Administered 2015-07-01: 1 via ORAL
  Filled 2015-06-30 (×3): qty 1

## 2015-06-30 MED ORDER — ACETAMINOPHEN 325 MG PO TABS: 650.0000 mg | ORAL_TABLET | ORAL | Status: DC | PRN

## 2015-06-30 NOTE — Progress Notes (Signed)
No acute events AVSS Drain 30 Awake and alert R CN3 palsy unchanged Moving all extremities well Bandage c/d/i Stable Encouraged ambulation Keep drain for now Transfer to floor this afternoon

## 2015-06-30 NOTE — Progress Notes (Signed)
Pt received alert, verbal with no noted distress. Pt stable, neuro intact. Surgical dressing clean, dry and intact. Drain in place. Safety measures in place. Pt oriented to room. Call bell within reach. Family at bedside. Will continue to monitor.

## 2015-06-30 NOTE — Progress Notes (Signed)
eLink Physician-Brief Progress Note Patient Name: Bianca Acosta DOB: 1975/12/05 MRN: [id number]   Date of Service  06/30/2015  HPI/Events of Note  39 yo F with Rt pcomm aneurysm, now s/p Right craniotomy for clipping of carotid circulation aneurysm  eICU Interventions  Stable Bp control Neuro checks      Intervention Category Evaluation Type: New Patient Evaluation  Swayzee Wadley 06/30/2015, 1:14 AM

## 2015-06-30 NOTE — Progress Notes (Signed)
ANTIBIOTIC CONSULT NOTE - INITIAL  Pharmacy Consult for Vancomycin Indication: post-op prophylaxis  Allergies  Allergen Reactions  . Augmentin [Amoxicillin-Pot Clavulanate] Hives  . Ciprofloxacin Itching    Patient states she starts itching from inside or internal.    Patient Measurements: Height: 5\' 4"  (162.6 cm) Weight: 244 lb 0.8 oz (110.7 kg) IBW/kg (Calculated) : 54.7  Vital Signs: Temp: 97.3 F (36.3 C) (11/17 0026) Temp Source: Oral (11/16 1543) BP: 143/68 mmHg (11/17 0026) Pulse Rate: 63 (11/17 0026) Intake/Output from previous day: 11/16 0701 - 11/17 0700 In: 3350 [I.V.:3350] Out: 1700 [Urine:1450; Blood:250] Intake/Output from this shift: Total I/O In: 2600 [I.V.:2600] Out: 1700 [Urine:1450; Blood:250]  Labs:  Recent Labs  06/28/15 1430 06/28/15 2251  WBC 12.7* 14.2*  HGB 13.7 13.2  PLT 216 218  CREATININE 1.18* 1.13*   Estimated Creatinine Clearance: 82.2 mL/min (by C-G formula based on Cr of 1.13). No results for input(s): VANCOTROUGH, VANCOPEAK, VANCORANDOM, GENTTROUGH, GENTPEAK, GENTRANDOM, TOBRATROUGH, TOBRAPEAK, TOBRARND, AMIKACINPEAK, AMIKACINTROU, AMIKACIN in the last 72 hours.   Microbiology: Recent Results (from the past 720 hour(s))  MRSA PCR Screening     Status: None   Collection Time: 06/28/15 10:13 PM  Result Value Ref Range Status   MRSA by PCR NEGATIVE NEGATIVE Final    Comment:        The GeneXpert MRSA Assay (FDA approved for NASAL specimens only), is one component of a comprehensive MRSA colonization surveillance program. It is not intended to diagnose MRSA infection nor to guide or monitor treatment for MRSA infections.   Surgical pcr screen     Status: None   Collection Time: 06/29/15  9:33 AM  Result Value Ref Range Status   MRSA, PCR NEGATIVE NEGATIVE Final   Staphylococcus aureus NEGATIVE NEGATIVE Final    Comment:        The Xpert SA Assay (FDA approved for NASAL specimens in patients over 47 years of age), is  one component of a comprehensive surveillance program.  Test performance has been validated by [facility] for patients greater than or equal to 55 year old. It is not intended to diagnose infection nor to guide or monitor treatment.     Medical History: Past Medical History  Diagnosis Date  . Hypertension   . Headache     Medications:  Prescriptions prior to admission  Medication Sig Dispense Refill Last Dose  . BYSTOLIC 10 MG tablet Take 10 mg by mouth daily.   06/28/2015 at Unknown time  . predniSONE (DELTASONE) 10 MG tablet Take 20 mg by mouth daily with breakfast.   06/28/2015 at Unknown time  . sulfamethoxazole-trimethoprim (BACTRIM DS,SEPTRA DS) 800-160 MG tablet Take 1 tablet by mouth 2 (two) times daily.   06/27/2015 at Unknown time  . triamterene-hydrochlorothiazide (DYAZIDE) 50-25 MG capsule Take 1 capsule by mouth daily.   06/28/2015 at Unknown time  . HYDROmorphone (DILAUDID) 2 MG tablet Take 1 tablet (2 mg total) by mouth every 12 (twelve) hours as needed for severe pain. (Patient not taking: Reported on 06/28/2015) 20 tablet 0   . metoCLOPramide (REGLAN) 10 MG tablet Take 1 tablet (10 mg total) by mouth every 8 (eight) hours as needed for nausea. (Patient not taking: Reported on 06/28/2015) 30 tablet 1    Assessment: 39 y.o. F s/p cerebral angiogram. To continue Vancomycin post-op x 24 hours as pt with PCN allergy. Pt received Vancomycin 1gm IV pre-op ~1920. Estimated normalized CrCl 75 ml/min. WBC slightly elevated.  Goal of Therapy:  Vancomycin  trough level 10-15 mcg/ml  Plan:  Vancomycin 1500mg  IV once 12 hours post pre-op dose Pharmacy will sign off - please reconsult if needed  Sherlon Handing, PharmD, BCPS Clinical pharmacist, pager [phone] 06/30/2015,12:42 AM

## 2015-06-30 NOTE — Progress Notes (Signed)
Wasted 0.5 mg of dilaudid in sharps with Jethro Bastos, RN

## 2015-06-30 NOTE — Progress Notes (Signed)
Upon assessment, JP drain was not charged. Attempted to charge the drain and it would not hold. There was no drainage on the dressing. Contacted the neurosurgeon on call, Dr. Saintclair Halsted, who gave instructions to leave it to gravity drainage. Will continue to monitor site and update as needed.

## 2015-07-01 LAB — TYPE AND SCREEN
ABO/RH(D): O POS
ANTIBODY SCREEN: NEGATIVE
Unit division: 0
Unit division: 0

## 2015-07-01 MED ORDER — DEXAMETHASONE 1 MG PO TABS: ORAL_TABLET | ORAL | Status: DC

## 2015-07-01 MED ORDER — PANTOPRAZOLE SODIUM 20 MG PO TBEC: 20.0000 mg | DELAYED_RELEASE_TABLET | Freq: Every day | ORAL | Status: DC

## 2015-07-01 MED ORDER — LEVETIRACETAM 500 MG PO TABS: 500.0000 mg | ORAL_TABLET | Freq: Two times a day (BID) | ORAL | Status: DC

## 2015-07-01 MED ORDER — HYDROCODONE-ACETAMINOPHEN 5-325 MG PO TABS: 1.0000 | ORAL_TABLET | ORAL | Status: DC | PRN

## 2015-07-01 MED ORDER — FLUCONAZOLE 100 MG PO TABS: 200.0000 mg | ORAL_TABLET | Freq: Once | ORAL | Status: DC

## 2015-07-01 NOTE — Anesthesia Postprocedure Evaluation (Signed)
  Anesthesia Post-op Note  Patient: Bianca Acosta  Procedure(s) Performed: Procedure(s): CRANIOTOMY INTRACRANIAL ANEURYSM CLIPPING (Right)  Patient Location: PACU  Anesthesia Type:General  Level of Consciousness: awake, alert , oriented, sedated and patient cooperative  Airway and Oxygen Therapy: Patient Spontanous Breathing  Post-op Pain: mild  Post-op Assessment: Post-op Vital signs reviewed, Patient's Cardiovascular Status Stable, Respiratory Function Stable, Patent Airway, No signs of Nausea or vomiting and Pain level controlled LLE Motor Response: Purposeful movement, Responds to commands   RLE Motor Response: Purposeful movement, Responds to commands        Post-op Vital Signs: stable  Last Vitals:  Filed Vitals:   07/01/15 0628  BP: 148/60  Pulse: 72  Temp: 36.9 C  Resp: 16    Complications: No apparent anesthesia complications

## 2015-07-01 NOTE — Progress Notes (Signed)
Discharge orders received, Pt for discharge home today. IV d/c'd. D/c instructions and RX given with verbalized understanding. Family at bedside to assist patient with discharge. Staff bought pt downstairs via wheelchair. 07/01/15 1734

## 2015-07-01 NOTE — Progress Notes (Signed)
No acute events AVSS Awake, alert, oriented Moves all extremities well, no drift Incision clean, dry, intact Stable If ambulating well this afternoon expect discharge Drain removed and staples placed

## 2015-07-01 NOTE — Discharge Summary (Signed)
Date of admission: 06/28/2015  Date of discharge: 07/01/2015  Admission diagnosis: Unruptured right posterior communicating artery aneurysm, new onset cranial nerve III palsy  Discharge diagnosis: Same  Procedure performed: Right craniotomy for clipping of posterior communicating artery aneurysm, arteriogram  Attending: Aldean Ast, M.D.  Hospital course: Bianca Acosta was admitted through the emergency room after diagnosis of a right wrist or communicating artery aneurysm during the workup for a new onset cranial nerve III palsy. The following day she underwent arteriogram to further evaluate the configuration of the aneurysm and then was taken to the operating room for craniotomy and aneurysm clipping. She tolerated this well. She was at her preoperative neurological baseline after surgery and has remained there. She spent one night in the ICU and then transferred to the floor. Her drain was removed on postoperative day 2. At this time she is medically and neurologically stable and is ready for discharge.  Follow-up: With me in about 10 days  Discharge medications: Norco, dexamethasone taper, Protonix, resume the remainder of her prior medications

## 2015-10-04 ENCOUNTER — Ambulatory Visit: Payer: BLUE CROSS/BLUE SHIELD | Admitting: Sports Medicine

## 2015-11-01 ENCOUNTER — Encounter: Payer: Self-pay | Admitting: Sports Medicine

## 2015-11-01 ENCOUNTER — Ambulatory Visit (INDEPENDENT_AMBULATORY_CARE_PROVIDER_SITE_OTHER): Payer: BLUE CROSS/BLUE SHIELD | Admitting: Sports Medicine

## 2015-11-01 DIAGNOSIS — M79674 Pain in right toe(s): Secondary | ICD-10-CM

## 2015-11-01 DIAGNOSIS — L600 Ingrowing nail: Secondary | ICD-10-CM

## 2015-11-01 DIAGNOSIS — M79675 Pain in left toe(s): Secondary | ICD-10-CM | POA: Diagnosis not present

## 2015-11-01 DIAGNOSIS — L603 Nail dystrophy: Secondary | ICD-10-CM

## 2015-11-01 NOTE — Progress Notes (Signed)
Patient ID: Bianca Acosta, female   DOB: February 09, 1976, 40 y.o.   MRN: [id number] Subjective: Bianca Acosta is a 40 y.o. female patient seen today in office with complaint of painful thickened toenails; unable to trim. Patient denies history of Diabetes or Neuropathy. Patient states that is is s/p Aneurysm. Patient states that she wants to take care of her feet and possibly have the big toe nails removed because she is tired of the thickness and big toe nails growing in on the sides. Patient has no other pedal complaints at this time.   Patient Active Problem List   Diagnosis Date Noted  . 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 BYSTOLIC 10 MG tablet Take 10 mg by mouth daily.    . metoCLOPramide (REGLAN) 10 MG tablet Take 1 tablet (10 mg total) by mouth every 8 (eight) hours as needed for nausea. (Patient not taking: Reported on 06/28/2015) 30 tablet 1  . triamterene-hydrochlorothiazide (DYAZIDE) 50-25 MG capsule Take 1 capsule by mouth daily.     No current facility-administered medications on file prior to visit.    Allergies  Allergen Reactions  . Augmentin [Amoxicillin-Pot Clavulanate] Hives  . Ciprofloxacin Itching    Patient states she starts itching from inside or internal.    Objective: Physical Exam  General: Well developed, nourished, no acute distress, awake, alert and oriented x 3, eye patch on right.   Vascular: Dorsalis pedis artery 2/4 bilateral, Posterior tibial artery 2/4 bilateral, skin temperature warm to warm proximal to distal bilateral lower extremities, no varicosities, pedal hair present bilateral.  Neurological: Gross sensation present via light touch bilateral.   Dermatological: Skin is warm, dry, and supple bilateral, Nails 1-10 are tender, long, thick, and discolored with mild subungal debris, and mild  ingrowing at bilateral hallux medial and lateral margins with a pincher nail appearance, no webspace macerations present bilateral, no open lesions present bilateral, no callus/corns/hyperkeratotic tissue present bilateral. No signs of infection bilateral.  Musculoskeletal: No boney deformities noted bilateral. Muscular strength within normal limits without pain on range of motion. No pain with calf compression bilateral.  Assessment and Plan:  Problem List Items Addressed This Visit    None    Visit Diagnoses    Nail dystrophy    -  Primary    Relevant Orders    Fungus Culture with Smear    Ingrown nail        Relevant Orders    Fungus Culture with Smear    Toe pain, bilateral        Relevant Orders    Fungus Culture with Smear      -Examined patient.  -Discussed treatment options for painful mycotic nails. - Patient wants to consider temporary removal of bilateral hallux nails for next visit -Fungal culture obtained and sent to [facility] for review; all remaining nails mechically debrided using a sterile nail nipper without incident and filed smoothed. -Patient to return in 1 month/fungal culture results and possible bilateral nail procedure or sooner if symptoms worsen.  Landis Martins, DPM

## 2015-12-02 ENCOUNTER — Encounter: Payer: Self-pay | Admitting: Sports Medicine

## 2015-12-02 ENCOUNTER — Ambulatory Visit (INDEPENDENT_AMBULATORY_CARE_PROVIDER_SITE_OTHER): Payer: BLUE CROSS/BLUE SHIELD | Admitting: Sports Medicine

## 2015-12-02 DIAGNOSIS — M79674 Pain in right toe(s): Secondary | ICD-10-CM | POA: Diagnosis not present

## 2015-12-02 DIAGNOSIS — L603 Nail dystrophy: Secondary | ICD-10-CM

## 2015-12-02 DIAGNOSIS — M79675 Pain in left toe(s): Secondary | ICD-10-CM | POA: Diagnosis not present

## 2015-12-03 NOTE — Progress Notes (Signed)
Patient ID: Bianca Acosta, female   DOB: 08-22-1975, 40 y.o.   MRN: [id number] Subjective: Bianca Acosta is a 40 y.o. female patient seen today in office for review of fungal culture results. Patient has no other pedal complaints at this time.   Patient Active Problem List   Diagnosis Date Noted  . 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 BYSTOLIC 10 MG tablet Take 10 mg by mouth daily.    . metoCLOPramide (REGLAN) 10 MG tablet Take 1 tablet (10 mg total) by mouth every 8 (eight) hours as needed for nausea. (Patient not taking: Reported on 06/28/2015) 30 tablet 1  . triamterene-hydrochlorothiazide (DYAZIDE) 50-25 MG capsule Take 1 capsule by mouth daily.     No current facility-administered medications on file prior to visit.    Allergies  Allergen Reactions  . Augmentin [Amoxicillin-Pot Clavulanate] Hives  . Ciprofloxacin Itching    Patient states she starts itching from inside or internal.    Objective: Physical Exam  General: Well developed, nourished, no acute distress, awake, alert and oriented x 3, eye patch on right  Vascular: Dorsalis pedis artery 2/4 bilateral, Posterior tibial artery 2/4 bilateral, skin temperature warm to warm proximal to distal bilateral lower extremities, no varicosities, pedal hair present bilateral.  Neurological: Gross sensation present via light touch bilateral.   Dermatological: Skin is warm, dry, and supple bilateral, Nails 1-10 are tender, short, thick, and discolored with mild subungal debris with pincher nail fashion at bilateral hallux, no webspace macerations present bilateral, no open lesions present bilateral, no callus/corns/hyperkeratotic tissue present bilateral. No signs of infection bilateral.  Musculoskeletal: No boney deformities noted bilateral. Muscular strength within normal  limits without painon range of motion. No pain with calf compression bilateral.  Assessment and Plan:  Problem List Items Addressed This Visit    None    Visit Diagnoses    Nail dystrophy    -  Primary    Fungal culture negative consistent with Microtrauma    Toe pain, bilateral           -Examined patient.  -Discussed treatment options for microtrauma; fungal culture negative -Patient opt to try Nuvail; request placed -Advised patient to prevent excessive pressure to toes and from wearing ill-fitting shoes that can contribute to her nail changes -Patient to return as needed for follow up evaluation/nail trims or sooner if symptoms worsen.  Landis Martins, DPM

## 2015-12-05 ENCOUNTER — Telehealth: Payer: Self-pay | Admitting: *Deleted

## 2015-12-05 MED ORDER — NUVAIL EX SOLN
1.0000 [drp] | Freq: Every day | CUTANEOUS | Status: DC
Start: 2015-12-05 — End: 2018-05-06

## 2015-12-05 NOTE — Telephone Encounter (Addendum)
-----   Message from Landis Martins, [place] sent at 12/02/2015 10:22 AM EDT ----- Regarding: Nuvail Please order nuvail for patient Fungal culture negative; Microtrauma  thanks  Dr Cannon Kettle.  12/05/2015-Nuvail ordered through [facility].

## 2016-03-28 ENCOUNTER — Encounter: Payer: Self-pay | Admitting: *Deleted

## 2016-03-30 ENCOUNTER — Ambulatory Visit: Payer: BLUE CROSS/BLUE SHIELD | Admitting: Family Medicine

## 2016-04-23 ENCOUNTER — Encounter: Payer: Self-pay | Admitting: Family Medicine

## 2016-04-23 ENCOUNTER — Ambulatory Visit (INDEPENDENT_AMBULATORY_CARE_PROVIDER_SITE_OTHER): Payer: BLUE CROSS/BLUE SHIELD | Admitting: Family Medicine

## 2016-04-23 VITALS — BP 174/112 | HR 83 | Temp 99.3°F | Ht 63.0 in | Wt 253.5 lb

## 2016-04-23 DIAGNOSIS — Z6841 Body Mass Index (BMI) 40.0 and over, adult: Secondary | ICD-10-CM | POA: Diagnosis not present

## 2016-04-23 DIAGNOSIS — Z0001 Encounter for general adult medical examination with abnormal findings: Secondary | ICD-10-CM | POA: Diagnosis not present

## 2016-04-23 DIAGNOSIS — H4901 Third [oculomotor] nerve palsy, right eye: Secondary | ICD-10-CM | POA: Diagnosis not present

## 2016-04-23 DIAGNOSIS — I10 Essential (primary) hypertension: Secondary | ICD-10-CM | POA: Diagnosis not present

## 2016-04-23 DIAGNOSIS — Z130 Encounter for screening for diseases of the blood and blood-forming organs and certain disorders involving the immune mechanism: Secondary | ICD-10-CM | POA: Diagnosis not present

## 2016-04-23 LAB — CBC
HCT: 38.6 % (ref 36.0–46.0)
Hemoglobin: 12.9 g/dL (ref 12.0–15.0)
MCHC: 33.4 g/dL (ref 30.0–36.0)
MCV: 84.7 fl (ref 78.0–100.0)
Platelets: 193 10*3/uL (ref 150.0–400.0)
RBC: 4.56 Mil/uL (ref 3.87–5.11)
RDW: 15.2 % (ref 11.5–15.5)
WBC: 6.3 10*3/uL (ref 4.0–10.5)

## 2016-04-23 LAB — COMPREHENSIVE METABOLIC PANEL
ALT: 17 U/L (ref 0–35)
AST: 15 U/L (ref 0–37)
Albumin: 3.6 g/dL (ref 3.5–5.2)
Alkaline Phosphatase: 45 U/L (ref 39–117)
BILIRUBIN TOTAL: 0.3 mg/dL (ref 0.2–1.2)
BUN: 11 mg/dL (ref 6–23)
CO2: 29 meq/L (ref 19–32)
CREATININE: 0.86 mg/dL (ref 0.40–1.20)
Calcium: 8.6 mg/dL (ref 8.4–10.5)
Chloride: 108 mEq/L (ref 96–112)
GFR: 94.08 mL/min (ref >60.00)
GLUCOSE: 93 mg/dL (ref 70–99)
Potassium: 3.7 mEq/L (ref 3.5–5.1)
SODIUM: 141 meq/L (ref 135–145)
TOTAL PROTEIN: 6.4 g/dL (ref 6.0–8.3)

## 2016-04-23 LAB — LIPID PANEL
CHOL/HDL RATIO: 3
Cholesterol: 140 mg/dL (ref 0–200)
HDL: 44 mg/dL (ref >39.00)
LDL Cholesterol: 80 mg/dL (ref 0–99)
NONHDL: 95.77
Triglycerides: 81 mg/dL (ref 0.0–149.0)
VLDL: 16.2 mg/dL (ref 0.0–40.0)

## 2016-04-23 LAB — HEMOGLOBIN A1C: HEMOGLOBIN A1C: 5.8 % (ref 4.6–6.5)

## 2016-04-23 NOTE — Progress Notes (Addendum)
Subjective:  Patient ID: Bianca Acosta, female    DOB: 30-Mar-1976  Age: 40 y.o. MRN: [id number]  CC: Establish care, HTN  HPI Bianca Acosta is a 40 y.o. female presents to the clinic today to establish care.  Additional issues are below.  Preventative Healthcare  Pap smear: 6/16.  Immunizations  Tetanus - Unsure.  Flu - Declined.  Labs: Screening labs today.  Alcohol use: No.  Smoking/tobacco use: Current smoker.  HTN  New problem (to me).  Uncontrolled.  Patient is on bystolic and triamterene/HCTZ.  She endorses compliance with states that she was in a rush and just took her medications before she left home.  CN III palsy  Chronic problem.  Patient has had little improvement in the function of her right eye.  She was told that she would need surgery.  She is unsure of what she would like to do.  She is unhappy with the fact that things have not improved.  She would like to discuss referral to have a second opinion.  PMH, Surgical Hx, Family Hx, Social History reviewed and updated as below.  Past Medical History:  Diagnosis Date  . Brain aneurysm 06/29/2015  . Chicken pox   . Headache   . Hypertension    Past Surgical History:  Procedure Laterality Date  . CRANIOTOMY Right 06/29/2015   Procedure: CRANIOTOMY INTRACRANIAL ANEURYSM CLIPPING;  Surgeon: Kevan Ny Ditty, MD;  Location: [facility];  Service: Neurosurgery;  Laterality: Right;   Family History  Problem Relation Age of Onset  . Hypertension Mother   . Hypertension Father   . Hypertension Maternal Aunt   . Hypertension Maternal Uncle   . Hypertension Paternal Aunt   . Hypertension Paternal Uncle   . Hypertension Maternal Grandmother   . Hypertension Maternal Grandfather   . Hypertension Paternal Grandmother   . Hypertension Paternal Grandfather    Social History  Substance Use Topics  . Smoking status: Current Every Day Smoker    Packs/day: 0.50  . Smokeless  tobacco: Never Used  . Alcohol use No   Review of Systems  Eyes: Positive for photophobia and visual disturbance.  Cardiovascular: Positive for palpitations.  Gastrointestinal: Positive for abdominal pain and diarrhea.  Neurological: Positive for dizziness.       Head numbness - at craniotomy site.  Psychiatric/Behavioral:       Sadness, anxiety, stress, memory issues.   All other systems reviewed and are negative.  Objective:   Today's Vitals: BP (!) 174/112 (BP Location: Left Arm, Patient Position: Sitting, Cuff Size: Large)   Pulse 83   Temp 99.3 F (37.4 C) (Oral)   Ht '5\' 3"'$  (1.6 m)   Wt 253 lb 8 oz (115 kg)   SpO2 (!) 5%   BMI 44.91 kg/m   Physical Exam  Constitutional: She is oriented to person, place, and time.  Morbidly obese female in no acute distress.  HENT:  Head: Normocephalic and atraumatic.  Mouth/Throat: Oropharynx is clear and moist.  Eyes: Conjunctivae are normal.  Abnormal EOM secondary to R cranial nerve palsy.  Neck: Neck supple.  Cardiovascular: Normal rate and regular rhythm.   2/6 systolic murmur.  Pulmonary/Chest: Effort normal. She has no wheezes. She has no rales.  Abdominal: Soft. She exhibits no distension. There is no tenderness. There is no rebound and no guarding.  Musculoskeletal: Normal range of motion.  Lymphadenopathy:    She has no cervical adenopathy.  Neurological: She is alert and oriented to person,  place, and time.  Skin: Skin is warm and dry. No rash noted.  Psychiatric: She has a normal mood and affect.  Vitals reviewed.  Assessment & Plan:   Problem List Items Addressed This Visit    Morbid obesity with BMI of 40.0-44.9, adult ([place])   Relevant Orders   Lipid Profile   HgB A1c   HTN (hypertension)    New problem to me. Uncontrolled. Likely secondary to noncompliance.  Advised compliance with bystolic and Triamterene/HCTZ. F/u in 2 weeks for BP check.       Relevant Orders   Comp Met (CMET)   Encounter for  preventative adult health care exam with abnormal findings - Primary    Declined flu. Unsure about tetanus. Screening labs today. Pap smear up-to-date.      Third nerve palsy of right eye    New problem (to me). Sending for second opinion.      Relevant Orders   Ambulatory referral to Ophthalmology    Other Visit Diagnoses    Screening for deficiency anemia       Relevant Orders   CBC      Outpatient Encounter Prescriptions as of 04/23/2016  Medication Sig  . BYSTOLIC 10 MG tablet Take 10 mg by mouth daily.  . Dermatological Products, Misc. (NUVAIL) SOLN Apply 1 drop topically daily.  Marland Kitchen triamterene-hydrochlorothiazide (DYAZIDE) 50-25 MG capsule Take 1 capsule by mouth daily.  . [DISCONTINUED] metoCLOPramide (REGLAN) 10 MG tablet Take 1 tablet (10 mg total) by mouth every 8 (eight) hours as needed for nausea. (Patient not taking: Reported on 06/28/2015)   No facility-administered encounter medications on file as of 04/23/2016.     Follow-up: Return in about 2 weeks (around 05/07/2016) for HTN follow up.  [place]

## 2016-04-23 NOTE — Assessment & Plan Note (Signed)
New problem to me. Uncontrolled. Likely secondary to noncompliance.  Advised compliance with bystolic and Triamterene/HCTZ. F/u in 2 weeks for BP check.

## 2016-04-23 NOTE — Progress Notes (Signed)
Pre visit review using our clinic review tool, if applicable. No additional management support is needed unless otherwise documented below in the visit note. 

## 2016-04-23 NOTE — Patient Instructions (Signed)
Follow up in 2 weeks regarding your BP.  Continue your current medications.  We will be in touch regarding your referral.  Take care  Dr. Lacinda Axon    Health Maintenance, Female Adopting a healthy lifestyle and getting preventive care can go a long way to promote health and wellness. Talk with your health care provider about what schedule of regular examinations is right for you. This is a good chance for you to check in with your provider about disease prevention and staying healthy. In between checkups, there are plenty of things you can do on your own. Experts have done a lot of research about which lifestyle changes and preventive measures are most likely to keep you healthy. Ask your health care provider for more information. WEIGHT AND DIET  Eat a healthy diet  Be sure to include plenty of vegetables, fruits, low-fat dairy products, and lean protein.  Do not eat a lot of foods high in solid fats, added sugars, or salt.  Get regular exercise. This is one of the most important things you can do for your health.  Most adults should exercise for at least 150 minutes each week. The exercise should increase your heart rate and make you sweat (moderate-intensity exercise).  Most adults should also do strengthening exercises at least twice a week. This is in addition to the moderate-intensity exercise.  Maintain a healthy weight  Body mass index (BMI) is a measurement that can be used to identify possible weight problems. It estimates body fat based on height and weight. Your health care provider can help determine your BMI and help you achieve or maintain a healthy weight.  For females 40 years of age and older:   A BMI below 18.5 is considered underweight.  A BMI of 18.5 to 24.9 is normal.  A BMI of 25 to 29.9 is considered overweight.  A BMI of 30 and above is considered obese.  Watch levels of cholesterol and blood lipids  You should start having your blood tested for lipids  and cholesterol at 40 years of age, then have this test every 5 years.  You may need to have your cholesterol levels checked more often if:  Your lipid or cholesterol levels are high.  You are older than 40 years of age.  You are at high risk for heart disease.  CANCER SCREENING   Lung Cancer  Lung cancer screening is recommended for adults 31-59 years old who are at high risk for lung cancer because of a history of smoking.  A yearly low-dose CT scan of the lungs is recommended for people who:  Currently smoke.  Have quit within the past 15 years.  Have at least a 30-pack-year history of smoking. A pack year is smoking an average of one pack of cigarettes a day for 1 year.  Yearly screening should continue until it has been 15 years since you quit.  Yearly screening should stop if you develop a health problem that would prevent you from having lung cancer treatment.  Breast Cancer  Practice breast self-awareness. This means understanding how your breasts normally appear and feel.  It also means doing regular breast self-exams. Let your health care provider know about any changes, no matter how small.  If you are in your 20s or 30s, you should have a clinical breast exam (CBE) by a health care provider every 1-3 years as part of a regular health exam.  If you are 35 or older, have a CBE every year.  Also consider having a breast X-ray (mammogram) every year.  If you have a family history of breast cancer, talk to your health care provider about genetic screening.  If you are at high risk for breast cancer, talk to your health care provider about having an MRI and a mammogram every year.  Breast cancer gene (BRCA) assessment is recommended for women who have family members with BRCA-related cancers. BRCA-related cancers include:  Breast.  Ovarian.  Tubal.  Peritoneal cancers.  Results of the assessment will determine the need for genetic counseling and BRCA1 and  BRCA2 testing. Cervical Cancer Your health care provider may recommend that you be screened regularly for cancer of the pelvic organs (ovaries, uterus, and vagina). This screening involves a pelvic examination, including checking for microscopic changes to the surface of your cervix (Pap test). You may be encouraged to have this screening done every 3 years, beginning at age 67.  For women ages 48-65, health care providers may recommend pelvic exams and Pap testing every 3 years, or they may recommend the Pap and pelvic exam, combined with testing for human papilloma virus (HPV), every 5 years. Some types of HPV increase your risk of cervical cancer. Testing for HPV may also be done on women of any age with unclear Pap test results.  Other health care providers may not recommend any screening for nonpregnant women who are considered low risk for pelvic cancer and who do not have symptoms. Ask your health care provider if a screening pelvic exam is right for you.  If you have had past treatment for cervical cancer or a condition that could lead to cancer, you need Pap tests and screening for cancer for at least 20 years after your treatment. If Pap tests have been discontinued, your risk factors (such as having a new sexual partner) need to be reassessed to determine if screening should resume. Some women have medical problems that increase the chance of getting cervical cancer. In these cases, your health care provider may recommend more frequent screening and Pap tests. Colorectal Cancer  This type of cancer can be detected and often prevented.  Routine colorectal cancer screening usually begins at 40 years of age and continues through 40 years of age.  Your health care provider may recommend screening at an earlier age if you have risk factors for colon cancer.  Your health care provider may also recommend using home test kits to check for hidden blood in the stool.  A small camera at the end of  a tube can be used to examine your colon directly (sigmoidoscopy or colonoscopy). This is done to check for the earliest forms of colorectal cancer.  Routine screening usually begins at age 71.  Direct examination of the colon should be repeated every 5-10 years through 40 years of age. However, you may need to be screened more often if early forms of precancerous polyps or small growths are found. Skin Cancer  Check your skin from head to toe regularly.  Tell your health care provider about any new moles or changes in moles, especially if there is a change in a mole's shape or color.  Also tell your health care provider if you have a mole that is larger than the size of a pencil eraser.  Always use sunscreen. Apply sunscreen liberally and repeatedly throughout the day.  Protect yourself by wearing long sleeves, pants, a wide-brimmed hat, and sunglasses whenever you are outside. HEART DISEASE, DIABETES, AND HIGH BLOOD PRESSURE  High blood pressure causes heart disease and increases the risk of stroke. High blood pressure is more likely to develop in:  People who have blood pressure in the high end of the normal range (130-139/85-89 mm Hg).  People who are overweight or obese.  People who are African American.  If you are 18-39 years of age, have your blood pressure checked every 3-5 years. If you are 40 years of age or older, have your blood pressure checked every year. You should have your blood pressure measured twice--once when you are at a hospital or clinic, and once when you are not at a hospital or clinic. Record the average of the two measurements. To check your blood pressure when you are not at a hospital or clinic, you can use:  An automated blood pressure machine at a pharmacy.  A home blood pressure monitor.  If you are between 55 years and 79 years old, ask your health care provider if you should take aspirin to prevent strokes.  Have regular diabetes screenings. This  involves taking a blood sample to check your fasting blood sugar level.  If you are at a normal weight and have a low risk for diabetes, have this test once every three years after 40 years of age.  If you are overweight and have a high risk for diabetes, consider being tested at a younger age or more often. PREVENTING INFECTION  Hepatitis B  If you have a higher risk for hepatitis B, you should be screened for this virus. You are considered at high risk for hepatitis B if:  You were born in a country where hepatitis B is common. Ask your health care provider which countries are considered high risk.  Your parents were born in a high-risk country, and you have not been immunized against hepatitis B (hepatitis B vaccine).  You have HIV or AIDS.  You use needles to inject street drugs.  You live with someone who has hepatitis B.  You have had sex with someone who has hepatitis B.  You get hemodialysis treatment.  You take certain medicines for conditions, including cancer, organ transplantation, and autoimmune conditions. Hepatitis C  Blood testing is recommended for:  Everyone born from 1945 through 1965.  Anyone with known risk factors for hepatitis C. Sexually transmitted infections (STIs)  You should be screened for sexually transmitted infections (STIs) including gonorrhea and chlamydia if:  You are sexually active and are younger than 40 years of age.  You are older than 40 years of age and your health care provider tells you that you are at risk for this type of infection.  Your sexual activity has changed since you were last screened and you are at an increased risk for chlamydia or gonorrhea. Ask your health care provider if you are at risk.  If you do not have HIV, but are at risk, it may be recommended that you take a prescription medicine daily to prevent HIV infection. This is called pre-exposure prophylaxis (PrEP). You are considered at risk if:  You are  sexually active and do not regularly use condoms or know the HIV status of your partner(s).  You take drugs by injection.  You are sexually active with a partner who has HIV. Talk with your health care provider about whether you are at high risk of being infected with HIV. If you choose to begin PrEP, you should first be tested for HIV. You should then be tested every 3 months for as   long as you are taking PrEP.  PREGNANCY   If you are premenopausal and you may become pregnant, ask your health care provider about preconception counseling.  If you may become pregnant, take 400 to 800 micrograms (mcg) of folic acid every day.  If you want to prevent pregnancy, talk to your health care provider about birth control (contraception). OSTEOPOROSIS AND MENOPAUSE   Osteoporosis is a disease in which the bones lose minerals and strength with aging. This can result in serious bone fractures. Your risk for osteoporosis can be identified using a bone density scan.  If you are 27 years of age or older, or if you are at risk for osteoporosis and fractures, ask your health care provider if you should be screened.  Ask your health care provider whether you should take a calcium or vitamin D supplement to lower your risk for osteoporosis.  Menopause may have certain physical symptoms and risks.  Hormone replacement therapy may reduce some of these symptoms and risks. Talk to your health care provider about whether hormone replacement therapy is right for you.  HOME CARE INSTRUCTIONS   Schedule regular health, dental, and eye exams.  Stay current with your immunizations.   Do not use any tobacco products including cigarettes, chewing tobacco, or electronic cigarettes.  If you are pregnant, do not drink alcohol.  If you are breastfeeding, limit how much and how often you drink alcohol.  Limit alcohol intake to no more than 1 drink per day for nonpregnant women. One drink equals 12 ounces of beer, 5  ounces of wine, or 1 ounces of hard liquor.  Do not use street drugs.  Do not share needles.  Ask your health care provider for help if you need support or information about quitting drugs.  Tell your health care provider if you often feel depressed.  Tell your health care provider if you have ever been abused or do not feel safe at home.   This information is not intended to replace advice given to you by your health care provider. Make sure you discuss any questions you have with your health care provider.   Document Released: 02/12/2011 Document Revised: 08/20/2014 Document Reviewed: 07/01/2013 Elsevier Interactive Patient Education [employer].

## 2016-04-23 NOTE — Assessment & Plan Note (Signed)
Declined flu. Unsure about tetanus. Screening labs today. Pap smear up-to-date.

## 2016-04-23 NOTE — Assessment & Plan Note (Signed)
New problem (to me). Sending for second opinion.

## 2016-05-15 ENCOUNTER — Other Ambulatory Visit: Payer: Self-pay | Admitting: Neurosurgery

## 2016-05-15 ENCOUNTER — Other Ambulatory Visit (HOSPITAL_COMMUNITY): Payer: Self-pay | Admitting: Neurosurgery

## 2016-05-15 DIAGNOSIS — I671 Cerebral aneurysm, nonruptured: Secondary | ICD-10-CM

## 2016-05-18 ENCOUNTER — Encounter: Payer: Self-pay | Admitting: Family Medicine

## 2016-05-18 ENCOUNTER — Ambulatory Visit (INDEPENDENT_AMBULATORY_CARE_PROVIDER_SITE_OTHER): Payer: BLUE CROSS/BLUE SHIELD | Admitting: Family Medicine

## 2016-05-18 VITALS — BP 168/98 | HR 79 | Temp 98.5°F | Wt 247.2 lb

## 2016-05-18 DIAGNOSIS — I10 Essential (primary) hypertension: Secondary | ICD-10-CM | POA: Diagnosis not present

## 2016-05-18 DIAGNOSIS — M25561 Pain in right knee: Secondary | ICD-10-CM | POA: Diagnosis not present

## 2016-05-18 MED ORDER — NEBIVOLOL HCL 10 MG PO TABS: 10.0000 mg | ORAL_TABLET | Freq: Every day | ORAL | 0 refills | Status: DC

## 2016-05-18 MED ORDER — TRIAMTERENE-HCTZ 50-25 MG PO CAPS
1.0000 | ORAL_CAPSULE | Freq: Every day | ORAL | 6 refills | Status: DC
Start: 2016-05-18 — End: 2016-05-28

## 2016-05-18 NOTE — Assessment & Plan Note (Signed)
New problem. Normal exam. No indication for imaging. PRN Tylenol.

## 2016-05-18 NOTE — Patient Instructions (Signed)
Continue the meds.  Check your BP's at home and send me the readings in 2 weeks.  Take care  Dr. Lacinda Axon

## 2016-05-18 NOTE — Progress Notes (Signed)
Subjective:  Patient ID: Bianca Acosta, female    DOB: Oct 11, 1975  Age: 40 y.o. MRN: [id number]  CC: Follow up HTN  HPI:  40 year old female with a history of brain aneurysm, cranial nerve palsy (III), hypertension, and morbid obesity presents for follow-up regarding her hypertension. Patient also reports right knee pain.  HTN  Uncontrolled and worsening.  BP markedly elevated today.  Patient states that she did not take her medication yesterday because she forgot. She has just taken her medication less than 1 hour ago.  No reports of chest pain or shortness of breath. She does have some visual difficulty secondary to her cranial nerve palsy.  Right knee pain  New problem.  Patient states that she injured her knee while bowling on Tuesday.  No reports of instability.  Pain is located diffusely.  No known exacerbating or relieving factors. No medications or interventions tried.  Social Hx   Social History   Social History  . Marital status: Single    Spouse name: N/A  . Number of children: N/A  . Years of education: N/A   Social History Main Topics  . Smoking status: Current Every Day Smoker    Packs/day: 0.50  . Smokeless tobacco: Never Used  . Alcohol use No  . Drug use: No  . Sexual activity: Not Asked   Other Topics Concern  . None   Social History Narrative  . None   Review of Systems  Eyes: Positive for visual disturbance.  Musculoskeletal:       Right knee pain.   Objective:  BP (!) 168/98 (BP Location: Right Arm, Patient Position: Sitting, Cuff Size: Large)   Pulse 79   Temp 98.5 F (36.9 C) (Oral)   Wt 247 lb 4 oz (112.2 kg)   SpO2 96%   BMI 43.80 kg/m   BP/Weight 05/18/2016 04/23/2016 [id number]  Systolic BP [id number]  Diastolic BP 98 [id number] 47  Wt. (Lbs) 247.25 253.5 -  BMI 43.8 44.91 -   Physical Exam  Constitutional: She is oriented to person, place, and time. She appears well-developed. No distress.  Cardiovascular: Normal  rate and regular rhythm.   Pulmonary/Chest: Effort normal. She has no wheezes. She has no rales.  Musculoskeletal:  Knee: Right  Normal to inspection with no erythema or effusion or obvious bony abnormalities.  Palpation normal with no warmth, joint line tenderness, patellar tenderness, or condyle tenderness. ROM full in flexion and extension and lower leg rotation. Ligaments with solid consistent endpoints including ACL, PCL, LCL, MCL.   Neurological: She is alert and oriented to person, place, and time.  Psychiatric: She has a normal mood and affect.  Vitals reviewed.  Lab Results  Component Value Date   WBC 6.3 04/23/2016   HGB 12.9 04/23/2016   HCT 38.6 04/23/2016   PLT 193.0 04/23/2016   GLUCOSE 93 04/23/2016   CHOL 140 04/23/2016   TRIG 81.0 04/23/2016   HDL 44.00 04/23/2016   LDLCALC 80 04/23/2016   ALT 17 04/23/2016   AST 15 04/23/2016   NA 141 04/23/2016   K 3.7 04/23/2016   CL 108 04/23/2016   CREATININE 0.86 04/23/2016   BUN 11 04/23/2016   CO2 29 04/23/2016   INR 1.16 06/28/2015   HGBA1C 5.8 04/23/2016   Assessment & Plan:   Problem List Items Addressed This Visit    HTN (hypertension)    Uncontrolled, worsening. Secondary to noncompliance. Advised compliance with medication. Patient to check her blood pressures at  home and notify me over the next 2 weeks. Samples for Bystolic given. Triamterene/HCTZ refill.      Relevant Medications   nebivolol (BYSTOLIC) 10 MG tablet   triamterene-hydrochlorothiazide (DYAZIDE) 50-25 MG capsule   Right knee pain    New problem. Normal exam. No indication for imaging. PRN Tylenol.       Other Visit Diagnoses   None.    Meds ordered this encounter  Medications  . nebivolol (BYSTOLIC) 10 MG tablet    Sig: Take 1 tablet (10 mg total) by mouth daily. Lot - [id number] Exp 3/18    Dispense:  84 tablet    Refill:  0  . triamterene-hydrochlorothiazide (DYAZIDE) 50-25 MG capsule    Sig: Take 1 capsule by mouth daily.     Dispense:  30 capsule    Refill:  6   Follow-up: Return in about 1 month (around 06/18/2016).  [place]

## 2016-05-18 NOTE — Progress Notes (Signed)
Pre visit review using our clinic review tool, if applicable. No additional management support is needed unless otherwise documented below in the visit note. 

## 2016-05-18 NOTE — Assessment & Plan Note (Signed)
Uncontrolled, worsening. Secondary to noncompliance. Advised compliance with medication. Patient to check her blood pressures at home and notify me over the next 2 weeks. Samples for Bystolic given. Triamterene/HCTZ refill.

## 2016-05-25 ENCOUNTER — Ambulatory Visit (HOSPITAL_COMMUNITY)
Admission: RE | Admit: 2016-05-25 | Discharge: 2016-05-25 | Disposition: A | Discharge status: Home / Self Care | Payer: BLUE CROSS/BLUE SHIELD | Source: Ambulatory Visit | Attending: Neurosurgery | Admitting: Neurosurgery

## 2016-05-25 ENCOUNTER — Other Ambulatory Visit (HOSPITAL_COMMUNITY): Payer: Self-pay | Admitting: Neurosurgery

## 2016-05-25 DIAGNOSIS — Z880 Allergy status to penicillin: Secondary | ICD-10-CM | POA: Diagnosis not present

## 2016-05-25 DIAGNOSIS — I10 Essential (primary) hypertension: Secondary | ICD-10-CM | POA: Diagnosis not present

## 2016-05-25 DIAGNOSIS — Z48812 Encounter for surgical aftercare following surgery on the circulatory system: Secondary | ICD-10-CM | POA: Insufficient documentation

## 2016-05-25 DIAGNOSIS — I671 Cerebral aneurysm, nonruptured: Secondary | ICD-10-CM

## 2016-05-25 DIAGNOSIS — H532 Diplopia: Secondary | ICD-10-CM | POA: Insufficient documentation

## 2016-05-25 DIAGNOSIS — F17210 Nicotine dependence, cigarettes, uncomplicated: Secondary | ICD-10-CM | POA: Diagnosis not present

## 2016-05-25 HISTORY — PX: IR GENERIC HISTORICAL: IMG1180011

## 2016-05-25 LAB — URINALYSIS, ROUTINE W REFLEX MICROSCOPIC
BILIRUBIN URINE: NEGATIVE
Glucose, UA: NEGATIVE mg/dL
Hgb urine dipstick: NEGATIVE
KETONES UR: NEGATIVE mg/dL
NITRITE: NEGATIVE
PROTEIN: NEGATIVE mg/dL
Specific Gravity, Urine: 1.025 (ref 1.005–1.030)
pH: 6 (ref 5.0–8.0)

## 2016-05-25 LAB — CBC WITH DIFFERENTIAL/PLATELET
BASOS ABS: 0 10*3/uL (ref 0.0–0.1)
Basophils Relative: 0 %
EOS ABS: 0.3 10*3/uL (ref 0.0–0.7)
EOS PCT: 4 %
HCT: 39.5 % (ref 36.0–46.0)
Hemoglobin: 13.4 g/dL (ref 12.0–15.0)
LYMPHS PCT: 39 %
Lymphs Abs: 2.8 10*3/uL (ref 0.7–4.0)
MCH: 28.1 pg (ref 26.0–34.0)
MCHC: 33.9 g/dL (ref 30.0–36.0)
MCV: 82.8 fL (ref 78.0–100.0)
Monocytes Absolute: 0.5 10*3/uL (ref 0.1–1.0)
Monocytes Relative: 7 %
Neutro Abs: 3.6 10*3/uL (ref 1.7–7.7)
Neutrophils Relative %: 50 %
PLATELETS: 202 10*3/uL (ref 150–400)
RBC: 4.77 MIL/uL (ref 3.87–5.11)
RDW: 13.9 % (ref 11.5–15.5)
WBC: 7.1 10*3/uL (ref 4.0–10.5)

## 2016-05-25 LAB — BASIC METABOLIC PANEL
Anion gap: 8 (ref 5–15)
BUN: 13 mg/dL (ref 6–20)
CO2: 26 mmol/L (ref 22–32)
Calcium: 8.9 mg/dL (ref 8.9–10.3)
Chloride: 107 mmol/L (ref 101–111)
Creatinine, Ser: 1.01 mg/dL — ABNORMAL HIGH (ref 0.44–1.00)
Glucose, Bld: 94 mg/dL (ref 65–99)
POTASSIUM: 3.2 mmol/L — AB (ref 3.5–5.1)
SODIUM: 141 mmol/L (ref 135–145)

## 2016-05-25 LAB — URINE MICROSCOPIC-ADD ON: RBC / HPF: NONE SEEN RBC/hpf (ref 0–5)

## 2016-05-25 LAB — APTT: APTT: 36 s (ref 24–36)

## 2016-05-25 LAB — PROTIME-INR
INR: 0.95
PROTHROMBIN TIME: 12.7 s (ref 11.4–15.2)

## 2016-05-25 LAB — PREGNANCY, URINE: PREG TEST UR: NEGATIVE

## 2016-05-25 MED ORDER — FENTANYL CITRATE (PF) 100 MCG/2ML IJ SOLN
INTRAMUSCULAR | Status: AC | PRN
  Administered 2016-05-25 (×2): 25 ug via INTRAVENOUS

## 2016-05-25 MED ORDER — HEPARIN SODIUM (PORCINE) 1000 UNIT/ML IJ SOLN
INTRAMUSCULAR | Status: AC
  Filled 2016-05-25: qty 1

## 2016-05-25 MED ORDER — IOPAMIDOL (ISOVUE-300) INJECTION 61%
INTRAVENOUS | Status: AC
  Administered 2016-05-25: 50 mL
  Filled 2016-05-25: qty 150

## 2016-05-25 MED ORDER — MIDAZOLAM HCL 2 MG/2ML IJ SOLN
INTRAMUSCULAR | Status: AC
  Filled 2016-05-25: qty 2

## 2016-05-25 MED ORDER — LIDOCAINE HCL 1 % IJ SOLN
INTRAMUSCULAR | Status: AC
  Filled 2016-05-25: qty 20

## 2016-05-25 MED ORDER — HEPARIN SODIUM (PORCINE) 1000 UNIT/ML IJ SOLN
INTRAMUSCULAR | Status: AC | PRN
  Administered 2016-05-25: 2000 [IU] via INTRAVENOUS

## 2016-05-25 MED ORDER — SODIUM CHLORIDE 0.9 % IV SOLN: INTRAVENOUS | Status: DC

## 2016-05-25 MED ORDER — CHLORHEXIDINE GLUCONATE CLOTH 2 % EX PADS: 6.0000 | MEDICATED_PAD | Freq: Once | CUTANEOUS | Status: DC

## 2016-05-25 MED ORDER — HYDROCODONE-ACETAMINOPHEN 5-325 MG PO TABS: 1.0000 | ORAL_TABLET | ORAL | Status: DC | PRN

## 2016-05-25 MED ORDER — LIDOCAINE HCL 1 % IJ SOLN
INTRAMUSCULAR | Status: AC | PRN
  Administered 2016-05-25: 10 mL

## 2016-05-25 MED ORDER — FENTANYL CITRATE (PF) 100 MCG/2ML IJ SOLN
INTRAMUSCULAR | Status: AC
  Filled 2016-05-25: qty 2

## 2016-05-25 MED ORDER — MIDAZOLAM HCL 2 MG/2ML IJ SOLN
INTRAMUSCULAR | Status: AC | PRN
  Administered 2016-05-25 (×2): 1 mg via INTRAVENOUS

## 2016-05-25 NOTE — H&P (Signed)
CC:  Aneurysm  HPI: Bianca Acosta is a 40 y.o. 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 She has recovered some occulomotor function but continues to have double vision and requires a patch. She presents today for routine f/u diagnostic angiogram. Unfortunately, she continues to smoke.  PMH: Past Medical History:  Diagnosis Date  . Brain aneurysm 06/29/2015  . Chicken pox   . Headache   . Hypertension     PSH: Past Surgical History:  Procedure Laterality Date  . CRANIOTOMY Right 06/29/2015   Procedure: CRANIOTOMY INTRACRANIAL ANEURYSM CLIPPING;  Surgeon: Kevan Ny Ditty, MD;  Location: [facility];  Service: Neurosurgery;  Laterality: Right;    SH: Social History  Substance Use Topics  . Smoking status: Current Every Day Smoker    Packs/day: 0.50  . Smokeless tobacco: Never Used  . Alcohol use No    MEDS: Prior to Admission medications   Medication Sig Start Date End Date Taking? Authorizing Provider  nebivolol (BYSTOLIC) 10 MG tablet Take 1 tablet (10 mg total) by mouth daily. Lot - [id number] Exp 3/18 05/18/16  Yes Jayce Bing Neighbors, DO  triamterene-hydrochlorothiazide (DYAZIDE) 50-25 MG capsule Take 1 capsule by mouth daily. 05/18/16  Yes Coral Spikes, DO  Dermatological Products, Misc. (NUVAIL) SOLN Apply 1 drop topically daily. 12/05/15   Landis Martins, DPM    ALLERGY: Allergies  Allergen Reactions  . Augmentin [Amoxicillin-Pot Clavulanate] Hives  . Ciprofloxacin Itching    Patient states she starts itching from inside or internal.    ROS: ROS  NEUROLOGIC EXAM: Awake, alert, oriented Memory and concentration grossly intact Speech fluent, appropriate CN grossly intact Motor exam: Upper Extremities Deltoid Bicep Tricep Grip  Right 5/5 5/5 5/5 5/5  Left 5/5 5/5 5/5 5/5   Lower Extremity IP Quad PF DF EHL  Right 5/5 5/5 5/5 5/5 5/5  Left 5/5 5/5 5/5 5/5 5/5    Sensation grossly intact to LT  IMPRESSION: - 40 y.o. female 1 yr s/p clipping of right Pcom aneurysm  PLAN: - Proceed with diagnostic angiogram  I have reviewed the angiogram, risks, benefits, and alternatives with the patient. All questions were answered.

## 2016-05-25 NOTE — Sedation Documentation (Signed)
Patient is resting comfortably. 

## 2016-05-25 NOTE — Sedation Documentation (Signed)
Patient denies pain and is resting comfortably.  

## 2016-05-25 NOTE — Discharge Instructions (Signed)
Excuse from Work, [employer], or Physical Activity __________________________________________________ needs to be excused from: ___x__ Work _____ [employer] _____ Physical activity beginning now and through the following date: ____________________. __x___ He or she may return to work or school but should still avoid the following physical activity or activities from now until _____Wednesday_______________. Activity restrictions include: _____ Lifting more than ___10____ lb _____ Sitting longer than __________ minutes at a time _____ Standing longer than ________ minutes at a time _____ He or she may return to full physical activity as of _________10/18/17___________. Health Care Provider Name (printed): ______________Deborah Fredirick Maudlin RN__________________________   Date: ___10/13/17_____________   This information is not intended to replace advice given to you by your health care provider. Make sure you discuss any questions you have with your health care provider.   Document Released: 01/23/2001 Document Revised: 08/20/2014 Document Reviewed: 03/01/2014 Elsevier Interactive Patient Education 2016 Elsevier Inc. Cerebral Angiogram, Care After Refer to this sheet in the next few weeks. These instructions provide you with information on caring for yourself after your procedure. Your health care provider may also give you more specific instructions. Your treatment has been planned according to current medical practices, but problems sometimes occur. Call your health care provider if you have any problems or questions after your procedure. WHAT TO EXPECT AFTER THE PROCEDURE After your procedure, it is typical to have the following:  Bruising at the catheter insertion site that usually fades within 1-2 weeks.  Blood collecting in the tissue (hematoma) that may be painful to the touch. It should usually decrease in size and tenderness within 1-2 weeks.  A mild headache. HOME CARE INSTRUCTIONS  Take  medicines only as directed by your health care provider.  You may shower 24-48 hours after the procedure or as directed by your health care provider. Remove the bandage (dressing) and gently wash the site with plain soap and water. Pat the area dry with a clean towel. Do not rub the site, because this may cause bleeding.  Do not take baths, swim, or use a hot tub until your health care provider approves.  Check your insertion site every day for redness, swelling, or drainage.  Do not apply powder or lotion to the site.  Do not lift over 10 lb (4.5 kg) for 5 days after your procedure or as directed by your health care provider.  Ask your health care provider when it is okay to:  Return to work or school.  Resume usual physical activities or sports.  Resume sexual activity.  Do not drive home if you are discharged the same day as the procedure. Have someone else drive you.  You may drive 24 hours after the procedure unless otherwise instructed by your health care provider.  Do not operate machinery or power tools for 24 hours after the procedure or as directed by your health care provider.  If your procedure was done as an outpatient procedure, which means that you went home the same day as your procedure, a responsible adult should be with you for the first 24 hours after you arrive home.  Keep all follow-up visits as directed by your health care provider. This is important. SEEK MEDICAL CARE IF:  You have a fever.  You have chills.  You have increased bleeding from the catheter insertion site. Hold pressure on the site. SEEK IMMEDIATE MEDICAL CARE IF:  You have vision changes or loss of vision.  You have numbness or weakness on one side of your body.  You have  difficulty talking, or you have slurred speech or cannot speak (aphasia).  You feel confused or have difficulty remembering.  You have unusual pain at the catheter insertion site.  You have redness, warmth, or  swelling at the catheter insertion site.  You have drainage (other than a small amount of blood on the dressing) from the catheter insertion site.  The catheter insertion site is bleeding, and the bleeding does not stop after 30 minutes of holding steady pressure on the site. These symptoms may represent a serious problem that is an emergency. Do not wait to see if the symptoms will go away. Get medical help right away. Call your local emergency services (911 in [place] Do not drive yourself to the hospital.   This information is not intended to replace advice given to you by your health care provider. Make sure you discuss any questions you have with your health care provider.   Document Released: 12/14/2013 Document Revised: 05/18/2014 Document Reviewed: 12/14/2013 Elsevier Interactive Patient Education [employer].

## 2016-05-28 ENCOUNTER — Other Ambulatory Visit: Payer: Self-pay | Admitting: Family Medicine

## 2016-05-28 ENCOUNTER — Telehealth: Payer: Self-pay | Admitting: Family Medicine

## 2016-05-28 MED ORDER — TRIAMTERENE-HCTZ 37.5-25 MG PO TABS: 1.0000 | ORAL_TABLET | Freq: Every day | ORAL | 3 refills | Status: DC

## 2016-05-28 NOTE — Telephone Encounter (Signed)
Pt made aware

## 2016-05-28 NOTE — Telephone Encounter (Signed)
Pt called stating that her BP medication has been discontinued. Medication is triamterene-hydrochlorothiazide (DYAZIDE) 50-25 MG capsule. Pt states she needs another BP medication with a water pill included. Thank you!  Pharmacy is [id number] - [address], [place]   Call pt @ [phone]

## 2016-05-28 NOTE — Telephone Encounter (Signed)
lvtcb pt can call [facility] which is in the same general area [facility] [address] to see if they carry medication. Once a pharmacy gives her the information needed we can call into that pharmacy. I am not sure of what pharmacies carry.

## 2016-05-28 NOTE — Telephone Encounter (Signed)
Please contact pt at [phone]

## 2016-05-28 NOTE — Telephone Encounter (Signed)
Pt said that she is taking the medication along with BYSTOLIC. She said pharmacy carries the 37/5 but not 50/25 that was discontinued. Please advise?

## 2016-05-28 NOTE — Telephone Encounter (Signed)
Medication was refilled 05/18/16. We will follow up on fluid medication at next visit so that we know she is taking medication everyday.

## 2016-05-28 NOTE — Telephone Encounter (Signed)
Pt only was given a partial and only has 2 left. Pt went to [facility] and they don't carry it at all. [facility]. Please advise?

## 2016-05-28 NOTE — Telephone Encounter (Signed)
Refilled

## 2016-05-28 NOTE — Telephone Encounter (Signed)
Pt called back wanting to know if the mg can be 37.5/25? They do carry that one? Pt states her bp has been doing very well bp today was 148/68 it was done at her gyn at [facility]. Please advise?  Call pt @ [phone].

## 2016-05-28 NOTE — Telephone Encounter (Signed)
Ok. Thank you.

## 2016-06-06 ENCOUNTER — Encounter (HOSPITAL_COMMUNITY): Payer: Self-pay | Admitting: Neurosurgery

## 2016-06-25 ENCOUNTER — Ambulatory Visit: Payer: BLUE CROSS/BLUE SHIELD | Admitting: Family Medicine

## 2016-07-13 ENCOUNTER — Encounter: Payer: Self-pay | Admitting: Emergency Medicine

## 2016-07-13 ENCOUNTER — Telehealth: Payer: Self-pay | Admitting: Family Medicine

## 2016-07-13 ENCOUNTER — Ambulatory Visit: Payer: BLUE CROSS/BLUE SHIELD | Admitting: Family Medicine

## 2016-07-13 ENCOUNTER — Emergency Department
Admission: EM | Admit: 2016-07-13 | Discharge: 2016-07-13 | Disposition: A | Discharge status: Home / Self Care | Payer: BLUE CROSS/BLUE SHIELD | Attending: Emergency Medicine | Admitting: Emergency Medicine

## 2016-07-13 DIAGNOSIS — T7840XA Allergy, unspecified, initial encounter: Secondary | ICD-10-CM | POA: Diagnosis not present

## 2016-07-13 DIAGNOSIS — Z79899 Other long term (current) drug therapy: Secondary | ICD-10-CM | POA: Diagnosis not present

## 2016-07-13 DIAGNOSIS — F17200 Nicotine dependence, unspecified, uncomplicated: Secondary | ICD-10-CM | POA: Diagnosis not present

## 2016-07-13 DIAGNOSIS — I10 Essential (primary) hypertension: Secondary | ICD-10-CM | POA: Diagnosis not present

## 2016-07-13 MED ORDER — FAMOTIDINE IN NACL 20-0.9 MG/50ML-% IV SOLN
20.0000 mg | Freq: Once | INTRAVENOUS | Status: AC
  Administered 2016-07-13: 20 mg via INTRAVENOUS
  Filled 2016-07-13: qty 50

## 2016-07-13 MED ORDER — METHYLPREDNISOLONE SODIUM SUCC 125 MG IJ SOLR
125.0000 mg | Freq: Once | INTRAMUSCULAR | Status: AC
  Administered 2016-07-13: 125 mg via INTRAVENOUS
  Filled 2016-07-13: qty 2

## 2016-07-13 MED ORDER — DIPHENHYDRAMINE HCL 25 MG PO CAPS
50.0000 mg | ORAL_CAPSULE | Freq: Once | ORAL | Status: AC
  Administered 2016-07-13: 50 mg via ORAL
  Filled 2016-07-13: qty 2

## 2016-07-13 MED ORDER — PREDNISONE 20 MG PO TABS: 60.0000 mg | ORAL_TABLET | Freq: Every day | ORAL | 0 refills | Status: AC

## 2016-07-13 MED ORDER — FAMOTIDINE 20 MG PO TABS
20.0000 mg | ORAL_TABLET | Freq: Every day | ORAL | 0 refills | Status: DC
Start: 2016-07-13 — End: 2018-05-06

## 2016-07-13 MED ORDER — DIPHENHYDRAMINE HCL 25 MG PO CAPS: 25.0000 mg | ORAL_CAPSULE | ORAL | 2 refills | Status: DC | PRN

## 2016-07-13 MED ORDER — EPINEPHRINE 0.3 MG/0.3ML IJ SOAJ: 0.3000 mg | Freq: Once | INTRAMUSCULAR | 1 refills | Status: AC

## 2016-07-13 NOTE — ED Notes (Signed)
Pt reports continued itching to bilateral feet. Pt airway intact, even and nonlabored respirations noted, will continue to monitor.

## 2016-07-13 NOTE — ED Triage Notes (Addendum)
Patient presents to the ED with itching to feet, legs, palms of her hand, and swelling to the tip of her tongue.  Patient denies new soaps, detergents, or any food out of the ordinary.  Patient denies any new medications.  Patient reports itching began around 9:45am.

## 2016-07-13 NOTE — Telephone Encounter (Signed)
Pt called and stated that the bottom of her feet were itching and now her tongue is swelling and hands are itching. Sent pt's call to [facility] Triage.   Call pt@ [phone]

## 2016-07-13 NOTE — Telephone Encounter (Signed)
Patient Name: Bianca Acosta DOB: 1975-12-17 Initial Comment Caller states feet and hands are itching, tongue swelling. Nurse Assessment Nurse: Ronnald Ramp, RN, [place] Date/Time (Eastern Time): 07/13/2016 11:10:59 AM Confirm and document reason for call. If symptomatic, describe symptoms. ---Caller states her feet, hands, and tongue are itching for the last 1.5 hrs. Also with diarrhea. Does the patient have any new or worsening symptoms? ---Yes Will a triage be completed? ---Yes Related visit to physician within the last 2 weeks? ---No Does the PT have any chronic conditions? (i.e. diabetes, asthma, etc.) ---Yes List chronic conditions. ---HTN Is the patient pregnant or possibly pregnant? (Ask all females between the ages of 71-55) ---No Is this a behavioral health or substance abuse call? ---No Guidelines Guideline Title Affirmed Question Affirmed Notes Tongue Swelling All other adults with swollen tongue (Exception: tongue swelling is a recurrent problem AND NO swelling at present) Final Disposition User Go to ED Now Ronnald Ramp, RN, [facility] - ED Disagree/Comply: Comply

## 2016-07-13 NOTE — Telephone Encounter (Signed)
fyi

## 2016-07-13 NOTE — Telephone Encounter (Signed)
Patient is in the ED currently.

## 2016-07-13 NOTE — ED Provider Notes (Signed)
[facility] Emergency Department Provider Note  ____________________________________________  Time seen: Approximately 12:36 PM  I have reviewed the triage vital signs and the nursing notes.   HISTORY  Chief Complaint Allergic Reaction   HPI Bianca Acosta is a 40 y.o. female who presents for evaluation of an allergic reaction. Patient reports that her symptoms started around 9:30 AM this morning. She first noticed hives on her feet which then progressed to the rest of her body. She had one episode of diarrhea. She noticed that her tongue was getting swollen which prompted her visit to the emergency room. Patient reports 2 prior episodes of allergic reaction once on Augmentin and once on Cipro. She denies any new medications, soaps, detergents, food. Patient had a [place], egg, cheese sandwich for breakfast but reports that the itching and hives started before she ate. She denies dizziness, wheezing, difficulty breathing, throat pain or swelling, abdominal pain, nausea, vomiting.  Past Medical History:  Diagnosis Date  . Brain aneurysm 06/29/2015  . Chicken pox   . Headache   . Hypertension     Patient Active Problem List   Diagnosis Date Noted  . Right knee pain 05/18/2016  . Morbid obesity with BMI of 40.0-44.9, adult ([place]) 04/23/2016  . HTN (hypertension) 04/23/2016  . Encounter for preventative adult health care exam with abnormal findings 04/23/2016  . 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 CRANIOTOMY Right 06/29/2015   Procedure: CRANIOTOMY INTRACRANIAL ANEURYSM CLIPPING;  Surgeon: Kevan Ny Ditty, MD;  Location: [facility];  Service: Neurosurgery;  Laterality: Right;  . IR GENERIC HISTORICAL  05/25/2016   IR ANGIO INTRA EXTRACRAN SEL INTERNAL CAROTID BILAT MOD SED 05/25/2016 Consuella Lose, MD [facility]  . IR  GENERIC HISTORICAL  06/29/2015   IR ANGIO INTRA EXTRACRAN SEL INTERNAL CAROTID BILAT MOD SED 06/29/2015 Consuella Lose, MD [facility]  . IR GENERIC HISTORICAL  06/29/2015   IR ANGIO VERTEBRAL SEL VERTEBRAL UNI L MOD SED 06/29/2015 Consuella Lose, MD [facility]  . IR GENERIC HISTORICAL  06/29/2015   IR 3D INDEPENDENT WKST 06/29/2015 Consuella Lose, MD [facility]    Prior to Admission medications   Medication Sig Start Date End Date Taking? Authorizing Provider  Dermatological Products, Misc. (NUVAIL) SOLN Apply 1 drop topically daily. 12/05/15   Landis Martins, DPM  diphenhydrAMINE (BENADRYL) 25 mg capsule Take 1 capsule (25 mg total) by mouth every 4 (four) hours as needed for itching. 07/13/16 07/13/17  Rudene Re, MD  EPINEPHrine 0.3 mg/0.3 mL IJ SOAJ injection Inject 0.3 mLs (0.3 mg total) into the muscle once. 07/13/16 07/13/16  Rudene Re, MD  famotidine (PEPCID) 20 MG tablet Take 1 tablet (20 mg total) by mouth daily. 07/13/16 07/18/16  Rudene Re, MD  nebivolol (BYSTOLIC) 10 MG tablet Take 1 tablet (10 mg total) by mouth daily. Lot - [id number] Exp 3/18 05/18/16   Coral Spikes, DO  predniSONE (DELTASONE) 20 MG tablet Take 3 tablets (60 mg total) by mouth daily. 07/13/16 07/17/16  Rudene Re, MD  triamterene-hydrochlorothiazide (MAXZIDE-25) 37.5-25 MG tablet Take 1 tablet by mouth daily. 05/28/16   Coral Spikes, DO    Allergies Augmentin [amoxicillin-pot clavulanate] and Ciprofloxacin  Family History  Problem Relation Age of Onset  . Hypertension Mother   . Hypertension Father   . Hypertension Maternal Aunt   . Hypertension Maternal Uncle   .  Hypertension Paternal Aunt   . Hypertension Paternal Uncle   . Hypertension Maternal Grandmother   . Hypertension Maternal Grandfather   . Hypertension Paternal Grandmother   . Hypertension Paternal Grandfather     Social History Social History  Substance Use Topics  . Smoking status: Current Every  Day Smoker    Packs/day: 0.50  . Smokeless tobacco: Never Used  . Alcohol use No    Review of Systems  Constitutional: Negative for fever. Eyes: Negative for visual changes. ENT: Negative for sore throat. + tongue swelling  Neck: No neck pain  Cardiovascular: Negative for chest pain. Respiratory: Negative for shortness of breath. Gastrointestinal: Negative for abdominal pain, vomiting. + diarrhea. Genitourinary: Negative for dysuria. Musculoskeletal: Negative for back pain. Skin: + hives Neurological: Negative for headaches, weakness or numbness. Psych: No SI or HI  ____________________________________________   PHYSICAL EXAM:  VITAL SIGNS: ED Triage Vitals [07/13/16 1155]  Enc Vitals Group     BP (!) 179/92     Pulse Rate 85     Resp 18     Temp 99.7 F (37.6 C)     Temp Source Oral     SpO2 98 %     Weight 250 lb (113.4 kg)     Height 5\' 4"  (1.626 m)     Head Circumference      Peak Flow      Pain Score 0     Pain Loc      Pain Edu?      Excl. in [place]?     Constitutional: Alert and oriented. Well appearing and in no apparent distress. HEENT:      Head: Normocephalic and atraumatic.         Eyes: Conjunctivae are normal. Sclera is non-icteric. EOMI. PERRL      Mouth/Throat: Mucous membranes are moist. Minimal swelling of the tongue, airway is patent, uvula is midline with no swelling.      Neck: Supple with no signs of meningismus. No stridor Cardiovascular: Regular rate and rhythm. No murmurs, gallops, or rubs. 2+ symmetrical distal pulses are present in all extremities. No JVD. Respiratory: Normal respiratory effort. Lungs are clear to auscultation bilaterally. No wheezes, crackles, or rhonchi.  Gastrointestinal: Soft, non tender, and non distended with positive bowel sounds. No rebound or guarding. Genitourinary: No CVA tenderness. Musculoskeletal: Nontender with normal range of motion in all extremities. No edema, cyanosis, or erythema of  extremities. Neurologic: Normal speech and language. Face is symmetric. Moving all extremities. No gross focal neurologic deficits are appreciated. Skin: Diffuse hives Psychiatric: Mood and affect are normal. Speech and behavior are normal.  ____________________________________________   LABS (all labs ordered are listed, but only abnormal results are displayed)  Labs Reviewed - No data to display ____________________________________________  EKG  none ____________________________________________  RADIOLOGY  none  ____________________________________________   PROCEDURES  Procedure(s) performed: None Procedures Critical Care performed:  None ____________________________________________   INITIAL IMPRESSION / ASSESSMENT AND PLAN / ED COURSE   40 y.o. female who presents for evaluation of an allergic reaction. Patient has mild swelling of her tongue and diffuse hives. No respiratory distress, no wheezing or stridor. Patient looks stable at this time. We'll give Benadryl, Pepcid, Solu-Medrol and monitor closely.  Clinical Course    Tongue swelling has resolved. Patient has remained stable with no further symptoms of anaphylaxis. Continues to have mild hives. Patient has been observed for almost 4 hours in the emergency room. She is going to be discharged on Benadryl,  Pepcid, prednisone, and with prescriptions for EpiPen. Patient has been encouraged to follow up with her primary care doctor for further evaluation. I discussed signs and symptoms of anaphylaxis and explained to the patient how to use an EpiPen.  Pertinent labs & imaging results that were available during my care of the patient were reviewed by me and considered in my medical decision making (see chart for details).    ____________________________________________   FINAL CLINICAL IMPRESSION(S) / ED DIAGNOSES  Final diagnoses:  Allergic reaction, initial encounter      NEW MEDICATIONS STARTED DURING  THIS VISIT:  New Prescriptions   DIPHENHYDRAMINE (BENADRYL) 25 MG CAPSULE    Take 1 capsule (25 mg total) by mouth every 4 (four) hours as needed for itching.   EPINEPHRINE 0.3 MG/0.3 ML IJ SOAJ INJECTION    Inject 0.3 mLs (0.3 mg total) into the muscle once.   FAMOTIDINE (PEPCID) 20 MG TABLET    Take 1 tablet (20 mg total) by mouth daily.   PREDNISONE (DELTASONE) 20 MG TABLET    Take 3 tablets (60 mg total) by mouth daily.     Note:  This document was prepared using Dragon voice recognition software and may include unintentional dictation errors.    Rudene Re, MD 07/13/16 1524

## 2016-07-20 ENCOUNTER — Ambulatory Visit: Payer: BLUE CROSS/BLUE SHIELD | Admitting: Family Medicine

## 2016-07-23 ENCOUNTER — Ambulatory Visit (INDEPENDENT_AMBULATORY_CARE_PROVIDER_SITE_OTHER): Payer: BLUE CROSS/BLUE SHIELD | Admitting: Family Medicine

## 2016-07-23 ENCOUNTER — Encounter: Payer: Self-pay | Admitting: Family Medicine

## 2016-07-23 VITALS — BP 160/82 | HR 78 | Temp 98.5°F | Resp 14 | Wt 250.1 lb

## 2016-07-23 DIAGNOSIS — I10 Essential (primary) hypertension: Secondary | ICD-10-CM

## 2016-07-23 DIAGNOSIS — L509 Urticaria, unspecified: Secondary | ICD-10-CM | POA: Diagnosis not present

## 2016-07-23 MED ORDER — AMLODIPINE BESYLATE 5 MG PO TABS: 5.0000 mg | ORAL_TABLET | Freq: Every day | ORAL | 3 refills | Status: DC

## 2016-07-23 NOTE — Assessment & Plan Note (Signed)
New problem. Uncertain etiology. Now resolved. Will monitor. If recurs will send for allergy testing.

## 2016-07-23 NOTE — Progress Notes (Signed)
Subjective:  Patient ID: Bianca Acosta, female    DOB: 01-06-1976  Age: 40 y.o. MRN: [id number]  CC: Follow up HTN, recent Hives  HPI:  40 year old female presents for follow-up regarding hypertension. She also complains of recent hives.  HTN  BP elevated today.  Patient endorses compliance with Bystolic and triamterene HCTZ.  She did miss a dose a few days ago.  Hives  Patient reports that she recently expressed hives.  She went to the ED on 12/1 and was treated with Benadryl, Zantac, prednisone.  Her hives have now resolved.  However, she is concerned about this. No known etiology. The only thing that she has changed has been a deodorant. No other new exposures.  Social Hx   Social History   Social History  . Marital status: Single    Spouse name: N/A  . Number of children: N/A  . Years of education: N/A   Social History Main Topics  . Smoking status: Current Every Day Smoker    Packs/day: 0.50  . Smokeless tobacco: Never Used  . Alcohol use No  . Drug use: No  . Sexual activity: Not Asked   Other Topics Concern  . None   Social History Narrative  . None   Review of Systems  Constitutional: Negative.   Skin:       Recent hives.    Objective:  BP (!) 160/82 (BP Location: Left Arm, Patient Position: Sitting, Cuff Size: Large)   Pulse 78   Temp 98.5 F (36.9 C) (Oral)   Resp 14   Wt 250 lb 2 oz (113.5 kg)   SpO2 95%   BMI 42.93 kg/m   BP/Weight 07/23/2016 07/13/2016 [id number]  Systolic BP 0000000 [id number]  Diastolic BP 82 84 77  Wt. (Lbs) 250.13 250 247  BMI 42.93 42.91 43.75   Physical Exam  Constitutional: She is oriented to person, place, and time. She appears well-developed. No distress.  Cardiovascular: Normal rate and regular rhythm.   Pulmonary/Chest: Effort normal and breath sounds normal.  Neurological: She is alert and oriented to person, place, and time.  Psychiatric: She has a normal mood and affect.  Vitals reviewed.  Lab  Results  Component Value Date   WBC 7.1 05/25/2016   HGB 13.4 05/25/2016   HCT 39.5 05/25/2016   PLT 202 05/25/2016   GLUCOSE 94 05/25/2016   CHOL 140 04/23/2016   TRIG 81.0 04/23/2016   HDL 44.00 04/23/2016   LDLCALC 80 04/23/2016   ALT 17 04/23/2016   AST 15 04/23/2016   NA 141 05/25/2016   K 3.2 (L) 05/25/2016   CL 107 05/25/2016   CREATININE 1.01 (H) 05/25/2016   BUN 13 05/25/2016   CO2 26 05/25/2016   INR 0.95 05/25/2016   HGBA1C 5.8 04/23/2016   Assessment & Plan:   Problem List Items Addressed This Visit    HTN (hypertension)    Uncontrolled/worsening. Adding Norvasc.      Relevant Medications   amLODipine (NORVASC) 5 MG tablet   Hives    New problem. Uncertain etiology. Now resolved. Will monitor. If recurs will send for allergy testing.         Meds ordered this encounter  Medications  . amLODipine (NORVASC) 5 MG tablet    Sig: Take 1 tablet (5 mg total) by mouth daily.    Dispense:  90 tablet    Refill:  3    Follow-up: 1 month  [place]

## 2016-07-23 NOTE — Assessment & Plan Note (Signed)
Uncontrolled/worsening. Adding Norvasc.

## 2016-07-23 NOTE — Patient Instructions (Signed)
Continue your meds.  I have added amlodipine.  Follow up in 1 month.  Take care  Dr. Lacinda Axon

## 2016-07-23 NOTE — Progress Notes (Signed)
Pre visit review using our clinic review tool, if applicable. No additional management support is needed unless otherwise documented below in the visit note. 

## 2016-08-27 ENCOUNTER — Ambulatory Visit: Payer: BLUE CROSS/BLUE SHIELD | Admitting: Family Medicine

## 2017-05-05 DIAGNOSIS — R0602 Shortness of breath: Secondary | ICD-10-CM | POA: Diagnosis not present

## 2017-05-05 DIAGNOSIS — R05 Cough: Secondary | ICD-10-CM | POA: Diagnosis not present

## 2017-05-05 DIAGNOSIS — Z87891 Personal history of nicotine dependence: Secondary | ICD-10-CM | POA: Diagnosis not present

## 2017-05-05 DIAGNOSIS — J209 Acute bronchitis, unspecified: Secondary | ICD-10-CM | POA: Diagnosis not present

## 2017-05-05 DIAGNOSIS — F17200 Nicotine dependence, unspecified, uncomplicated: Secondary | ICD-10-CM | POA: Diagnosis not present

## 2017-05-05 DIAGNOSIS — J029 Acute pharyngitis, unspecified: Secondary | ICD-10-CM | POA: Diagnosis not present

## 2017-05-14 DIAGNOSIS — R05 Cough: Secondary | ICD-10-CM | POA: Diagnosis not present

## 2017-05-14 DIAGNOSIS — J189 Pneumonia, unspecified organism: Secondary | ICD-10-CM | POA: Diagnosis not present

## 2017-05-14 DIAGNOSIS — I10 Essential (primary) hypertension: Secondary | ICD-10-CM | POA: Diagnosis not present

## 2017-05-14 DIAGNOSIS — R0602 Shortness of breath: Secondary | ICD-10-CM | POA: Diagnosis not present

## 2017-09-13 ENCOUNTER — Emergency Department (HOSPITAL_COMMUNITY): Payer: BLUE CROSS/BLUE SHIELD

## 2017-09-13 ENCOUNTER — Encounter (HOSPITAL_COMMUNITY): Payer: Self-pay | Admitting: *Deleted

## 2017-09-13 ENCOUNTER — Other Ambulatory Visit: Payer: Self-pay

## 2017-09-13 ENCOUNTER — Emergency Department (HOSPITAL_COMMUNITY)
Admission: EM | Admit: 2017-09-13 | Discharge: 2017-09-13 | Disposition: A | Discharge status: Home / Self Care | Payer: BLUE CROSS/BLUE SHIELD | Attending: Emergency Medicine | Admitting: Emergency Medicine

## 2017-09-13 DIAGNOSIS — I10 Essential (primary) hypertension: Secondary | ICD-10-CM

## 2017-09-13 DIAGNOSIS — Z79899 Other long term (current) drug therapy: Secondary | ICD-10-CM | POA: Insufficient documentation

## 2017-09-13 DIAGNOSIS — R05 Cough: Secondary | ICD-10-CM | POA: Diagnosis not present

## 2017-09-13 DIAGNOSIS — R509 Fever, unspecified: Secondary | ICD-10-CM | POA: Diagnosis not present

## 2017-09-13 DIAGNOSIS — F17210 Nicotine dependence, cigarettes, uncomplicated: Secondary | ICD-10-CM | POA: Diagnosis not present

## 2017-09-13 DIAGNOSIS — R69 Illness, unspecified: Secondary | ICD-10-CM

## 2017-09-13 DIAGNOSIS — J111 Influenza due to unidentified influenza virus with other respiratory manifestations: Secondary | ICD-10-CM | POA: Diagnosis not present

## 2017-09-13 DIAGNOSIS — M545 Low back pain: Secondary | ICD-10-CM | POA: Diagnosis not present

## 2017-09-13 MED ORDER — AMLODIPINE BESYLATE 5 MG PO TABS
10.0000 mg | ORAL_TABLET | Freq: Once | ORAL | Status: AC
  Administered 2017-09-13: 10 mg via ORAL
  Filled 2017-09-13: qty 2

## 2017-09-13 MED ORDER — NEBIVOLOL HCL 10 MG PO TABS
10.0000 mg | ORAL_TABLET | Freq: Every day | ORAL | Status: DC
  Filled 2017-09-13: qty 1

## 2017-09-13 MED ORDER — AMLODIPINE BESYLATE 10 MG PO TABS: 10.0000 mg | ORAL_TABLET | Freq: Every day | ORAL | 2 refills | Status: DC

## 2017-09-13 MED ORDER — ACETAMINOPHEN 325 MG PO TABS
650.0000 mg | ORAL_TABLET | Freq: Once | ORAL | Status: AC
  Administered 2017-09-13: 650 mg via ORAL
  Filled 2017-09-13: qty 2

## 2017-09-13 NOTE — ED Triage Notes (Signed)
Pt reports flu like symptoms x 2 days. Has headache, bodyaches, cough, back pain, fever/chills. Hx of htn,  Has not taken her meds x 2 days.

## 2017-09-13 NOTE — Discharge Instructions (Signed)
Please fill your prescription take her blood pressure meds as directed. Review your medications closely with her primary doctor. Stay hydrated, take Tylenol and Motrin as needed for pain and fevers. Return for worsening symptoms such as shortness of breath or chest pain.

## 2017-09-13 NOTE — ED Notes (Signed)
edp aware of pt bp, pt instructed to take medication at home.

## 2017-09-13 NOTE — ED Provider Notes (Signed)
[facility] EMERGENCY DEPARTMENT Provider Note   CSN: [id number] Arrival date & time: 09/13/17  1012     History   Chief Complaint Chief Complaint  Patient presents with  . Influenza    HPI Bianca Acosta is a 42 y.o. female.  Patient with history of high blood pressure not currently taking medications due to financial reason and compliance with diuretic to do frequent urination presents with flulike symptoms. For 2 days patient has had headache bodyaches fever cough. Patient does have a history of pneumonia. No significant sick contacts. Patient still tolerating oral fluids. Patient ran out of her blood pressure medications and unable to fill due to cost.      Past Medical History:  Diagnosis Date  . Brain aneurysm 06/29/2015  . Chicken pox   . Headache   . Hypertension     Patient Active Problem List   Diagnosis Date Noted  . Hives 07/23/2016  . Right knee pain 05/18/2016  . Morbid obesity with BMI of 40.0-44.9, adult ([place]) 04/23/2016  . HTN (hypertension) 04/23/2016  . Encounter for preventative adult health care exam with abnormal findings 04/23/2016  . 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 CRANIOTOMY Right 06/29/2015   Procedure: CRANIOTOMY INTRACRANIAL ANEURYSM CLIPPING;  Surgeon: Kevan Ny Ditty, MD;  Location: [facility];  Service: Neurosurgery;  Laterality: Right;  . IR GENERIC HISTORICAL  05/25/2016   IR ANGIO INTRA EXTRACRAN SEL INTERNAL CAROTID BILAT MOD SED 05/25/2016 Consuella Lose, MD [facility]  . IR GENERIC HISTORICAL  06/29/2015   IR ANGIO INTRA EXTRACRAN SEL INTERNAL CAROTID BILAT MOD SED 06/29/2015 Consuella Lose, MD [facility]  . IR GENERIC HISTORICAL  06/29/2015   IR ANGIO VERTEBRAL SEL VERTEBRAL UNI L MOD SED 06/29/2015 Consuella Lose, MD [facility]  . IR GENERIC HISTORICAL   06/29/2015   IR 3D INDEPENDENT WKST 06/29/2015 Consuella Lose, MD [facility]    OB History    No data available       Home Medications    Prior to Admission medications   Medication Sig Start Date End Date Taking? Authorizing Provider  amLODipine (NORVASC) 10 MG tablet Take 1 tablet (10 mg total) by mouth daily. 09/13/17   Elnora Morrison, MD  Dermatological Products, Misc. (NUVAIL) SOLN Apply 1 drop topically daily. 12/05/15   Landis Martins, DPM  diphenhydrAMINE (BENADRYL) 25 mg capsule Take 1 capsule (25 mg total) by mouth every 4 (four) hours as needed for itching. 07/13/16 07/13/17  Rudene Re, MD  famotidine (PEPCID) 20 MG tablet Take 1 tablet (20 mg total) by mouth daily. 07/13/16 07/18/16  Rudene Re, MD  triamterene-hydrochlorothiazide (MAXZIDE-25) 37.5-25 MG tablet Take 1 tablet by mouth daily. 05/28/16   Coral Spikes, DO    Family History Family History  Problem Relation Age of Onset  . Hypertension Mother   . Hypertension Father   . Hypertension Maternal Aunt   . Hypertension Maternal Uncle   . Hypertension Paternal Aunt   . Hypertension Paternal Uncle   . Hypertension Maternal Grandmother   . Hypertension Maternal Grandfather   . Hypertension Paternal Grandmother   . Hypertension Paternal Grandfather     Social History Social History   Tobacco Use  . Smoking status: Current Every Day Smoker    Packs/day: 0.50  . Smokeless tobacco: Never Used  Substance Use Topics  . Alcohol use: No  .  Drug use: No     Allergies   Augmentin [amoxicillin-pot clavulanate] and Ciprofloxacin   Review of Systems Review of Systems  Constitutional: Positive for appetite change, chills and fever.  HENT: Positive for congestion.   Respiratory: Positive for cough. Negative for shortness of breath.   Cardiovascular: Negative for chest pain.  Gastrointestinal: Negative for abdominal pain and vomiting.  Genitourinary: Negative for dysuria.  Musculoskeletal:  Positive for arthralgias and back pain. Negative for neck pain and neck stiffness.  Skin: Negative for rash.  Neurological: Positive for headaches. Negative for light-headedness.     Physical Exam Updated Vital Signs BP (!) 207/99   Pulse (!) 104   Temp 99.5 F (37.5 C) (Oral)   Resp 18   LMP 09/06/2017   SpO2 98%   Physical Exam  Constitutional: She is oriented to person, place, and time. She appears well-developed and well-nourished.  HENT:  Head: Normocephalic and atraumatic.  Eyes: Conjunctivae are normal. Right eye exhibits no discharge (congested). Left eye exhibits no discharge.  Neck: Normal range of motion. Neck supple. No tracheal deviation present.  Cardiovascular: Regular rhythm.  No murmur heard. Pulmonary/Chest: Effort normal and breath sounds normal.  Abdominal: Soft. She exhibits no distension. There is no tenderness. There is no guarding.  Musculoskeletal: She exhibits no edema.  Neurological: She is alert and oriented to person, place, and time.  Skin: Skin is warm. No rash noted.  Psychiatric: She has a normal mood and affect.  Nursing note and vitals reviewed.    ED Treatments / Results  Labs (all labs ordered are listed, but only abnormal results are displayed) Labs Reviewed - No data to display  EKG  EKG Interpretation None       Radiology Dg Chest 2 View  Result Date: 09/13/2017 CLINICAL DATA:  Flu like symptoms EXAM: CHEST  2 VIEW COMPARISON:  01/05/2015 FINDINGS: There is no focal parenchymal opacity. There is no pleural effusion or pneumothorax. There is mild cardiomegaly. The osseous structures are unremarkable. IMPRESSION: No active cardiopulmonary disease. Electronically Signed   By: Kathreen Devoid   On: 09/13/2017 13:19    Procedures Procedures (including critical care time)  Medications Ordered in ED Medications  acetaminophen (TYLENOL) tablet 650 mg (650 mg Oral Given 09/13/17 1132)  amLODipine (NORVASC) tablet 10 mg (10 mg Oral  Given 09/13/17 1302)     Initial Impression / Assessment and Plan / ED Course  I have reviewed the triage vital signs and the nursing notes.  Pertinent labs & imaging results that were available during my care of the patient were reviewed by me and considered in my medical decision making (see chart for details).    Patient presents with flulike illness.  She is overall well-appearing mild tachycardia likely from mild dehydration. Discussion of importance of taking blood pressure medications. Patient does not have any currently and agreed to take calcium channel blocker and obtain a primary care doctor.  Results and differential diagnosis were discussed with the patient/parent/guardian. Xrays were independently reviewed by myself.  Close follow up outpatient was discussed, comfortable with the plan.   Medications  acetaminophen (TYLENOL) tablet 650 mg (650 mg Oral Given 09/13/17 1132)  amLODipine (NORVASC) tablet 10 mg (10 mg Oral Given 09/13/17 1302)    Vitals:   09/13/17 1024 09/13/17 1215  BP: (!) 185/92 (!) 207/99  Pulse: (!) 108 (!) 104  Resp: 18 18  Temp: 99.5 F (37.5 C)   TempSrc: Oral   SpO2: 100% 98%  Final diagnoses:  Essential hypertension  Influenza-like illness     Final Clinical Impressions(s) / ED Diagnoses   Final diagnoses:  Essential hypertension  Influenza-like illness    ED Discharge Orders        Ordered    amLODipine (NORVASC) 10 MG tablet  Daily     09/13/17 1417       Elnora Morrison, MD 09/13/17 1421

## 2017-11-11 ENCOUNTER — Ambulatory Visit: Payer: BLUE CROSS/BLUE SHIELD | Admitting: Obstetrics and Gynecology

## 2018-01-16 ENCOUNTER — Encounter: Payer: Self-pay | Admitting: Obstetrics and Gynecology

## 2018-01-16 ENCOUNTER — Ambulatory Visit (INDEPENDENT_AMBULATORY_CARE_PROVIDER_SITE_OTHER): Payer: BLUE CROSS/BLUE SHIELD | Admitting: Obstetrics and Gynecology

## 2018-01-16 VITALS — BP 150/90 | Ht 64.0 in | Wt 239.0 lb

## 2018-01-16 DIAGNOSIS — Z113 Encounter for screening for infections with a predominantly sexual mode of transmission: Secondary | ICD-10-CM

## 2018-01-16 DIAGNOSIS — Z1231 Encounter for screening mammogram for malignant neoplasm of breast: Secondary | ICD-10-CM

## 2018-01-16 DIAGNOSIS — Z1239 Encounter for other screening for malignant neoplasm of breast: Secondary | ICD-10-CM

## 2018-01-16 DIAGNOSIS — Z01419 Encounter for gynecological examination (general) (routine) without abnormal findings: Secondary | ICD-10-CM

## 2018-01-16 DIAGNOSIS — R87610 Atypical squamous cells of undetermined significance on cytologic smear of cervix (ASC-US): Secondary | ICD-10-CM | POA: Diagnosis not present

## 2018-01-16 DIAGNOSIS — Z124 Encounter for screening for malignant neoplasm of cervix: Secondary | ICD-10-CM

## 2018-01-16 NOTE — Patient Instructions (Signed)
[facility]  Mammogram A mammogram is an X-ray of the breasts that is done to check for abnormal changes. This procedure can screen for and detect any changes that may suggest breast cancer. A mammogram can also identify other changes and variations in the breast, such as:  Inflammation of the breast tissue (mastitis).  An infected area that contains a collection of pus (abscess).  A fluid-filled sac (cyst).  Fibrocystic changes. This is when breast tissue becomes denser, which can make the tissue feel rope-like or uneven under the skin.  Tumors that are not cancerous (benign).  Tell a health care provider about:  Any allergies you have.  If you have breast implants.  If you have had previous breast disease, biopsy, or surgery.  If you are breastfeeding.  Any possibility that you could be pregnant, if this applies.  If you are younger than age 31.  If you have a family history of breast cancer. What are the risks? Generally, this is a safe procedure. However, problems may occur, including:  Exposure to radiation. Radiation levels are very low with this test.  The results being misinterpreted.  The need for further tests.  The inability of the mammogram to detect certain cancers.  What happens before the procedure?  Schedule your test about 1-2 weeks after your menstrual period. This is usually when your breasts are the least tender.  If you have had a mammogram done at a different facility in the past, get the mammogram X-rays or have them sent to your current exam facility in order to compare them.  Wash your breasts and under your arms the day of the test.  Do not wear deodorants, perfumes, lotions, or powders anywhere on your body on the day of the test.  Remove any jewelry from your neck.  Wear clothes that you can change into and out of easily. What happens during the procedure?  You will undress from the waist up and put on a gown.  You will stand in  front of the X-ray machine.  Each breast will be placed between two plastic or glass plates. The plates will compress your breast for a few seconds. Try to stay as relaxed as possible during the procedure. This does not cause any harm to your breasts and any discomfort you feel will be very brief.  X-rays will be taken from different angles of each breast. The procedure may vary among health care providers and hospitals. What happens after the procedure?  The mammogram will be examined by a specialist (radiologist).  You may need to repeat certain parts of the test, depending on the quality of the images. This is commonly done if the radiologist needs a better view of the breast tissue.  Ask when your test results will be ready. Make sure you get your test results.  You may resume your normal activities. This information is not intended to replace advice given to you by your health care provider. Make sure you discuss any questions you have with your health care provider. Document Released: 07/27/2000 Document Revised: 01/02/2016 Document Reviewed: 10/08/2014 Elsevier Interactive Patient Education  [employer].

## 2018-01-16 NOTE — Progress Notes (Signed)
Gynecology Annual Exam  PCP: Coral Spikes, DO  Chief Complaint:  Chief Complaint  Patient presents with  . Gynecologic Exam    History of Present Illness: Patient is a 42 y.o. G4P0000 presents for annual exam. The patient has no complaints today.   LMP: Patient's last menstrual period was 01/07/2018. Average Interval: regular, 28 days Duration of flow: 5 days Heavy Menses: yes Clots: yes Intermenstrual Bleeding: no Postcoital Bleeding: yes Dysmenorrhea: yes   The patient is sexually active. She currently uses condoms for contraception. She denies dyspareunia.  The patient does perform self breast exams.  There is no notable family history of breast or ovarian cancer in her family.  The patient wears seatbelts: yes.   The patient has regular exercise: not asked.    The patient denies current symptoms of depression.    Review of Systems: Review of Systems  Constitutional: Negative for chills and fever.  HENT: Negative for congestion.   Respiratory: Negative for cough and shortness of breath.   Cardiovascular: Negative for chest pain and palpitations.  Gastrointestinal: Negative for abdominal pain, constipation, diarrhea, heartburn, nausea and vomiting.  Genitourinary: Negative for dysuria, frequency and urgency.  Skin: Negative for itching and rash.  Neurological: Negative for dizziness and headaches.  Endo/Heme/Allergies: Negative for polydipsia.  Psychiatric/Behavioral: Negative for depression.    Past Medical History:  Past Medical History:  Diagnosis Date  . Brain aneurysm 06/29/2015  . Chicken pox   . Headache   . Hypertension     Past Surgical History:  Past Surgical History:  Procedure Laterality Date  . CRANIOTOMY Right 06/29/2015   Procedure: CRANIOTOMY INTRACRANIAL ANEURYSM CLIPPING;  Surgeon: Kevan Ny Ditty, MD;  Location: [facility];  Service: Neurosurgery;  Laterality: Right;  . IR GENERIC HISTORICAL  05/25/2016   IR ANGIO INTRA  EXTRACRAN SEL INTERNAL CAROTID BILAT MOD SED 05/25/2016 Consuella Lose, MD [facility]  . IR GENERIC HISTORICAL  06/29/2015   IR ANGIO INTRA EXTRACRAN SEL INTERNAL CAROTID BILAT MOD SED 06/29/2015 Consuella Lose, MD [facility]  . IR GENERIC HISTORICAL  06/29/2015   IR ANGIO VERTEBRAL SEL VERTEBRAL UNI L MOD SED 06/29/2015 Consuella Lose, MD [facility]  . IR GENERIC HISTORICAL  06/29/2015   IR 3D INDEPENDENT WKST 06/29/2015 Consuella Lose, MD [facility]    Gynecologic History:  Patient's last menstrual period was 01/07/2018. Contraception: Condoms Last Pap: Results were: 04/15/2015 NIL and HR HPV+  Last mammogram: none on records [facility] 04/27/2015 Intramural right 34.7 x 26.5 x 35.43mm, intramural right 36/8 x 36.4 x 37.105mm, submucosal anterior 16.7 x 10.0 x 12.11mm, and intramural anterior 18.3 x 17.6 x 17.13mm  Obstetric History: G4P0000  Family History:  Family History  Problem Relation Age of Onset  . Hypertension Mother   . Hypertension Father   . Hypertension Maternal Aunt   . Hypertension Maternal Uncle   . Hypertension Paternal Aunt   . Hypertension Paternal Uncle   . Hypertension Maternal Grandmother   . Hypertension Maternal Grandfather   . Hypertension Paternal Grandmother   . Hypertension Paternal Grandfather     Social History:  Social History   Socioeconomic History  . Marital status: Single    Spouse name: Not on file  . Number of children: Not on file  . Years of education: Not on file  . Highest education level: Not on file  Occupational History  . Not on file  Social Needs  . Financial resource strain: Not on file  .  Food insecurity:    Worry: Not on file    Inability: Not on file  . Transportation needs:    Medical: Not on file    Non-medical: Not on file  Tobacco Use  . Smoking status: Current Every Day Smoker    Packs/day: 0.50  . Smokeless tobacco: Never Used  Substance and Sexual Activity  . Alcohol use: No  . Drug use:  No  . Sexual activity: Not Currently    Birth control/protection: None  Lifestyle  . Physical activity:    Days per week: Not on file    Minutes per session: Not on file  . Stress: Not on file  Relationships  . Social connections:    Talks on phone: Not on file    Gets together: Not on file    Attends religious service: Not on file    Active member of club or organization: Not on file    Attends meetings of clubs or organizations: Not on file    Relationship status: Not on file  . Intimate partner violence:    Fear of current or ex partner: Not on file    Emotionally abused: Not on file    Physically abused: Not on file    Forced sexual activity: Not on file  Other Topics Concern  . Not on file  Social History Narrative  . Not on file    Allergies:  Allergies  Allergen Reactions  . Augmentin [Amoxicillin-Pot Clavulanate] Hives  . Ciprofloxacin Itching    Patient states she starts itching from inside or internal.    Medications: Prior to Admission medications   Medication Sig Start Date End Date Taking? Authorizing Provider  amLODipine (NORVASC) 10 MG tablet Take 1 tablet (10 mg total) by mouth daily. 09/13/17  Yes Elnora Morrison, MD  triamterene-hydrochlorothiazide (MAXZIDE-25) 37.5-25 MG tablet Take 1 tablet by mouth daily. 05/28/16  Yes Coral Spikes, DO  Dermatological Products, Misc. (NUVAIL) SOLN Apply 1 drop topically daily. Patient not taking: Reported on 01/16/2018 12/05/15   Landis Martins, DPM  diphenhydrAMINE (BENADRYL) 25 mg capsule Take 1 capsule (25 mg total) by mouth every 4 (four) hours as needed for itching. 07/13/16 07/13/17  Rudene Re, MD  famotidine (PEPCID) 20 MG tablet Take 1 tablet (20 mg total) by mouth daily. 07/13/16 07/18/16  Rudene Re, MD    Physical Exam Vitals: Blood pressure (!) 150/90, height 5\' 4"  (1.626 m), weight 239 lb (108.4 kg), last menstrual period 01/07/2018.  General: NAD HEENT: normocephalic, anicteric Thyroid: no  enlargement, no palpable nodules Pulmonary: No increased work of breathing, CTAB Cardiovascular: RRR, distal pulses 2+ Breast: Breast symmetrical, no tenderness, no palpable nodules or masses, no skin or nipple retraction present, no nipple discharge.  No axillary or supraclavicular lymphadenopathy. Abdomen: NABS, soft, non-tender, non-distended.  Umbilicus without lesions.  No hepatomegaly, splenomegaly or masses palpable. No evidence of hernia  Genitourinary:  External: Normal external female genitalia.  Normal urethral meatus, normal Bartholin's and Skene's glands.    Vagina: Normal vaginal mucosa, no evidence of prolapse.    Cervix: Grossly normal in appearance, no bleeding  Uterus: Non-enlarged, mobile, normal contour.  No CMT  Adnexa: ovaries non-enlarged, no adnexal masses  Rectal: deferred  Lymphatic: no evidence of inguinal lymphadenopathy Extremities: no edema, erythema, or tenderness Neurologic: Grossly intact Psychiatric: mood appropriate, affect full  Female chaperone present for pelvic and breast  portions of the physical exam    Assessment: 42 y.o. G4P0000 routine annual exam  Plan: Problem List  Items Addressed This Visit    None    Visit Diagnoses    Breast screening    -  Primary   Relevant Orders   MM DIGITAL SCREENING BILATERAL   Encounter for gynecological examination without abnormal finding       Relevant Orders   PapIG, HPV, rfx 16/18   HEP, RPR, HIV Panel (Completed)   Screening for malignant neoplasm of cervix       Relevant Orders   PapIG, HPV, rfx 16/18   Routine screening for STI (sexually transmitted infection)       Relevant Orders   PapIG, HPV, rfx 16/18   HEP, RPR, HIV Panel (Completed)      1) Mammogram - recommend yearly screening mammogram.  Mammogram Was ordered today  2) STI screening  wasoffered and accepted  3) ASCCP guidelines and rational discussed.  Patient opts for every 3 years screening interval  4) Contraception - the  patient is currently using  condoms.  She is attempting to conceive in the near future - does have known fibroids - discussed relatively low probability of conceiving, but myomectomy would require putting plan for pregnancy on hold for at least 12 months following procedure at which time conception chances are even lower  5) Colonoscopy -- Screening recommended starting at age 46 for average risk individuals, age 17 for individuals deemed at increased risk (including African Americans) and recommended to continue until age 24.  For patient age 3-85 individualized approach is recommended.  Gold standard screening is via colonoscopy, Cologuard screening is an acceptable alternative for patient unwilling or unable to undergo colonoscopy.  "Colorectal cancer screening for average?risk adults: 2018 guideline update from the American Cancer Society"CA: A Cancer Journal for Clinicians: Jan 09, 2017   6) Routine healthcare maintenance including cholesterol, diabetes screening discussed managed by PCP  7) Return in about 1 year (around 01/17/2019) for annual.   Malachy Mood, MD, [facility], [place] 01/16/2018, 2:39 PM

## 2018-01-17 ENCOUNTER — Encounter (INDEPENDENT_AMBULATORY_CARE_PROVIDER_SITE_OTHER): Payer: Self-pay

## 2018-01-17 LAB — HEP, RPR, HIV PANEL
HEP B S AG: NEGATIVE
HIV Screen 4th Generation wRfx: NONREACTIVE
RPR Ser Ql: NONREACTIVE

## 2018-01-21 LAB — PAPIG, HPV, RFX 16/18
HPV, HIGH-RISK: NEGATIVE
PAP Smear Comment: 0

## 2018-01-22 ENCOUNTER — Encounter (INDEPENDENT_AMBULATORY_CARE_PROVIDER_SITE_OTHER): Payer: Self-pay

## 2018-03-31 ENCOUNTER — Encounter: Payer: BLUE CROSS/BLUE SHIELD | Admitting: Family Medicine

## 2018-03-31 DIAGNOSIS — Z0289 Encounter for other administrative examinations: Secondary | ICD-10-CM

## 2018-05-03 ENCOUNTER — Other Ambulatory Visit: Payer: Self-pay | Admitting: Family Medicine

## 2018-05-05 ENCOUNTER — Telehealth: Payer: Self-pay

## 2018-05-05 NOTE — Telephone Encounter (Signed)
No she needs to go through [facility] for that, even if she's changing PCP's they are the one's that have been following that

## 2018-05-05 NOTE — Telephone Encounter (Signed)
Pt doesn't have primary care anymore. She is trying to find one.  Is wondering if [facility] can refill her triamterene-HCTZ to [place]

## 2018-05-06 ENCOUNTER — Ambulatory Visit (INDEPENDENT_AMBULATORY_CARE_PROVIDER_SITE_OTHER): Payer: BLUE CROSS/BLUE SHIELD | Admitting: Family Medicine

## 2018-05-06 ENCOUNTER — Encounter: Payer: Self-pay | Admitting: Family Medicine

## 2018-05-06 VITALS — BP 186/100 | HR 98 | Temp 99.2°F | Ht 64.0 in | Wt 248.6 lb

## 2018-05-06 DIAGNOSIS — I10 Essential (primary) hypertension: Secondary | ICD-10-CM | POA: Diagnosis not present

## 2018-05-06 DIAGNOSIS — I671 Cerebral aneurysm, nonruptured: Secondary | ICD-10-CM | POA: Diagnosis not present

## 2018-05-06 DIAGNOSIS — Z8249 Family history of ischemic heart disease and other diseases of the circulatory system: Secondary | ICD-10-CM | POA: Diagnosis not present

## 2018-05-06 MED ORDER — TRIAMTERENE-HCTZ 37.5-25 MG PO TABS: 1.0000 | ORAL_TABLET | Freq: Every day | ORAL | 3 refills | Status: DC

## 2018-05-06 MED ORDER — AMLODIPINE BESYLATE 10 MG PO TABS: 10.0000 mg | ORAL_TABLET | Freq: Every day | ORAL | 3 refills | Status: DC

## 2018-05-06 NOTE — Progress Notes (Signed)
Subjective:    Patient ID: Bianca Acosta, female    DOB: Aug 31, 1975, 42 y.o.   MRN: [id number]  HPI  Patient presents to clinic as a transference care from Dr. Lacinda Axon.  Main concern today is getting refills on blood pressure medication.  She has been out for a couple of days, and blood pressure has been running high.  Patient has an extremely strong family history of hypertension and heart disease.  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  Patient smokes 1/3 to 1/2 pack of cigarettes per day, would like to quit, but is concerned about gaining weight.  Patient would also like referral to cardiology for full cardiac evaluation due to strong family history of hypertension and heart disease.  Medical history, surgical history, family history, social history reviewed and updated accordingly in chart.   Patient Active Problem List   Diagnosis Date Noted  . Hives 07/23/2016  . Right knee pain 05/18/2016  . Morbid obesity with BMI of 40.0-44.9, adult ([place]) 04/23/2016  . HTN (hypertension) 04/23/2016  . Encounter for preventative adult health care exam with abnormal findings 04/23/2016  . 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 Brain aneurysm 06/29/2015  . Chicken pox   . Headache   . Hypertension    Social History   Tobacco Use  . Smoking status: Current Every Day Smoker    Packs/day: 0.50  . Smokeless tobacco: Never Used  Substance Use Topics  . Alcohol use: No   Past Surgical History:  Procedure Laterality Date  . CRANIOTOMY Right 06/29/2015   Procedure: CRANIOTOMY INTRACRANIAL ANEURYSM CLIPPING;  Surgeon: Kevan Ny Ditty, MD;  Location: [facility];  Service: Neurosurgery;  Laterality: Right;  . IR GENERIC HISTORICAL  05/25/2016   IR ANGIO INTRA EXTRACRAN SEL INTERNAL CAROTID BILAT MOD SED 05/25/2016 Consuella Lose, MD  [facility]  . IR GENERIC HISTORICAL  06/29/2015   IR ANGIO INTRA EXTRACRAN SEL INTERNAL CAROTID BILAT MOD SED 06/29/2015 Consuella Lose, MD [facility]  . IR GENERIC HISTORICAL  06/29/2015   IR ANGIO VERTEBRAL SEL VERTEBRAL UNI L MOD SED 06/29/2015 Consuella Lose, MD [facility]  . IR GENERIC HISTORICAL  06/29/2015   IR 3D INDEPENDENT WKST 06/29/2015 Consuella Lose, MD [facility]   Family History  Problem Relation Age of Onset  . Hypertension Mother   . Hypertension Father   . Hypertension Maternal Aunt   . Hypertension Maternal Uncle   . Hypertension Paternal Aunt   . Hypertension Paternal Uncle   . Hypertension Maternal Grandmother   . Hypertension Maternal Grandfather   . Hypertension Paternal Grandmother   . Hypertension Paternal Grandfather    Review of Systems   Constitutional: Negative for chills, fatigue and fever.  HENT: Negative for congestion, ear pain, sinus pain and sore throat.   Eyes: Negative.   Respiratory: Negative for cough, shortness of breath and wheezing.   Cardiovascular: Negative for chest pain, palpitations and leg swelling.  Gastrointestinal: Negative for abdominal pain, diarrhea, nausea and vomiting.  Genitourinary: Negative for dysuria, frequency and urgency.  Musculoskeletal: Negative for arthralgias and myalgias.  Skin: Negative for color change, pallor and rash.  Neurological: Negative for syncope, light-headedness and headaches.  Psychiatric/Behavioral: The patient is not nervous/anxious.       Objective:   Physical Exam  Constitutional: She is oriented to person, place, and time. She appears  well-developed and well-nourished. No distress.   Head: Normocephalic and atraumatic.  Eyes: EOM are normal. No scleral icterus.  Neck: Normal range of motion. Neck supple. No tracheal deviation present.  Cardiovascular: Normal rate, regular rhythm and normal heart sounds.  Pulmonary/Chest: Effort normal and breath sounds normal. No  respiratory distress. She has no wheezes. She has no rales.  Neurological: She is alert and oriented to person, place, and time.  Gait normal  Skin: Skin is warm and dry. No pallor.  Psychiatric: She has a normal mood and affect. Her behavior is normal. Thought content normal.   Nursing note and vitals reviewed.    Vitals:   05/06/18 1635 05/06/18 1650  BP: (!) 198/110 (!) 186/100  Pulse: 98   Temp: (!) 100.4 F (38 C) 99.2 F (37.3 C)  SpO2: 97%     Assessment & Plan:   Essential hypertension- refills of amlodipine and Maxide given.  Patient has done very well on these 2 blood pressure medications in the past and would like to continue taking.  Referral for cardiology placed due to severe family history of hypertension.  Brain aneurysm-stable since interventional radiology surgery in 2016  Cigarette smoker- patient encouraged to quit smoking as this increases her risks of vascular disease, many cancers and many other illnesses.  Patient is not ready to quit at this time.  Obesity- reviewed healthy diet and encouraged regular exercise  Follow up in 2 weeks for recheck on blood pressure after getting back on medications and also for fasting lab work.  Future lab orders placed today.

## 2018-05-07 NOTE — Telephone Encounter (Signed)
Left detailed msg as such.

## 2018-05-16 ENCOUNTER — Other Ambulatory Visit (INDEPENDENT_AMBULATORY_CARE_PROVIDER_SITE_OTHER): Payer: BLUE CROSS/BLUE SHIELD

## 2018-05-16 DIAGNOSIS — I10 Essential (primary) hypertension: Secondary | ICD-10-CM

## 2018-05-16 DIAGNOSIS — E559 Vitamin D deficiency, unspecified: Secondary | ICD-10-CM

## 2018-05-16 NOTE — Addendum Note (Signed)
Addended by: Arby Barrette on: 05/16/2018 09:27 AM   Modules accepted: Orders

## 2018-05-17 LAB — THYROID PANEL WITH TSH
Free Thyroxine Index: 2.6 (ref 1.4–3.8)
T3 UPTAKE: 36 % — AB (ref 22–35)
T4 TOTAL: 7.2 ug/dL (ref 5.1–11.9)
TSH: 0.95 m[IU]/L

## 2018-05-17 LAB — B12 AND FOLATE PANEL
FOLATE: 18.9 ng/mL
VITAMIN B 12: 584 pg/mL (ref 200–1100)

## 2018-05-17 LAB — COMPREHENSIVE METABOLIC PANEL
AG Ratio: 1.6 (calc) (ref 1.0–2.5)
ALT: 16 U/L (ref 6–29)
AST: 16 U/L (ref 10–30)
Albumin: 4.1 g/dL (ref 3.6–5.1)
Alkaline phosphatase (APISO): 68 U/L (ref 33–115)
BUN: 10 mg/dL (ref 7–25)
CO2: 21 mmol/L (ref 20–32)
CREATININE: 0.85 mg/dL (ref 0.50–1.10)
Calcium: 9.5 mg/dL (ref 8.6–10.2)
Chloride: 106 mmol/L (ref 98–110)
GLUCOSE: 99 mg/dL (ref 65–99)
Globulin: 2.6 g/dL (calc) (ref 1.9–3.7)
Potassium: 3.9 mmol/L (ref 3.5–5.3)
Sodium: 141 mmol/L (ref 135–146)
Total Bilirubin: 0.4 mg/dL (ref 0.2–1.2)
Total Protein: 6.7 g/dL (ref 6.1–8.1)

## 2018-05-17 LAB — LIPID PANEL
CHOL/HDL RATIO: 2.7 (calc) (ref <5.0)
CHOLESTEROL: 134 mg/dL (ref <200)
HDL: 49 mg/dL — ABNORMAL LOW (ref >50)
LDL CHOLESTEROL (CALC): 69 mg/dL
NON-HDL CHOLESTEROL (CALC): 85 mg/dL (ref <130)
TRIGLYCERIDES: 81 mg/dL (ref <150)

## 2018-05-17 LAB — CBC WITH DIFFERENTIAL/PLATELET
BASOS ABS: 58 {cells}/uL (ref 0–200)
Basophils Relative: 0.9 %
EOS ABS: 250 {cells}/uL (ref 15–500)
Eosinophils Relative: 3.9 %
HCT: 40.7 % (ref 35.0–45.0)
HEMOGLOBIN: 13.2 g/dL (ref 11.7–15.5)
Lymphs Abs: 2362 cells/uL (ref 850–3900)
MCH: 27.1 pg (ref 27.0–33.0)
MCHC: 32.4 g/dL (ref 32.0–36.0)
MCV: 83.6 fL (ref 80.0–100.0)
MONOS PCT: 8.3 %
MPV: 11.5 fL (ref 7.5–12.5)
NEUTROS ABS: 3200 {cells}/uL (ref 1500–7800)
NEUTROS PCT: 50 %
Platelets: 241 10*3/uL (ref 140–400)
RBC: 4.87 10*6/uL (ref 3.80–5.10)
RDW: 14.1 % (ref 11.0–15.0)
TOTAL LYMPHOCYTE: 36.9 %
WBC mixed population: 531 cells/uL (ref 200–950)
WBC: 6.4 10*3/uL (ref 3.8–10.8)

## 2018-05-17 LAB — VITAMIN D 25 HYDROXY (VIT D DEFICIENCY, FRACTURES): Vit D, 25-Hydroxy: 11 ng/mL — ABNORMAL LOW (ref 30–100)

## 2018-05-20 MED ORDER — VITAMIN D (ERGOCALCIFEROL) 1.25 MG (50000 UNIT) PO CAPS: 50000.0000 [IU] | ORAL_CAPSULE | ORAL | 0 refills | Status: DC

## 2018-05-20 NOTE — Addendum Note (Signed)
Addended by: Philis Nettle on: 05/20/2018 01:38 PM   Modules accepted: Orders

## 2018-05-20 NOTE — Progress Notes (Signed)
Vit D replacement sent in

## 2018-05-22 ENCOUNTER — Ambulatory Visit: Payer: BLUE CROSS/BLUE SHIELD | Admitting: Internal Medicine

## 2018-05-22 ENCOUNTER — Ambulatory Visit: Payer: BLUE CROSS/BLUE SHIELD | Admitting: Family Medicine

## 2018-05-29 ENCOUNTER — Ambulatory Visit: Payer: BLUE CROSS/BLUE SHIELD | Admitting: Family Medicine

## 2018-06-02 ENCOUNTER — Ambulatory Visit (INDEPENDENT_AMBULATORY_CARE_PROVIDER_SITE_OTHER): Payer: BLUE CROSS/BLUE SHIELD | Admitting: Family Medicine

## 2018-06-02 ENCOUNTER — Encounter: Payer: Self-pay | Admitting: Family Medicine

## 2018-06-02 VITALS — BP 164/92 | HR 91 | Temp 99.0°F | Ht 63.0 in | Wt 245.6 lb

## 2018-06-02 DIAGNOSIS — I10 Essential (primary) hypertension: Secondary | ICD-10-CM

## 2018-06-02 DIAGNOSIS — F17210 Nicotine dependence, cigarettes, uncomplicated: Secondary | ICD-10-CM | POA: Diagnosis not present

## 2018-06-02 DIAGNOSIS — E559 Vitamin D deficiency, unspecified: Secondary | ICD-10-CM | POA: Diagnosis not present

## 2018-06-02 NOTE — Progress Notes (Signed)
Subjective:    Patient ID: Bianca Acosta, female    DOB: 03/10/1976, 42 y.o.   MRN: [id number]  HPI   Patient presents to clinic to follow-up on blood pressure & vitamin D deficiency.  Patient recently got back on blood pressure medications after being off of them for many months.  Patient had taken triamterene in the past with good success.  She also took amlodipine in the past with mild success, but patient states she is not sure this medication is helpful anymore.  Patient notes when she was taking by systolic a few years ago, she felt her best and this seemed to work the best to control blood pressure.  However, patient was unable to afford the medication and that is in question of stopping it.  Denies any chest pain, denies any shortness of breath or wheezing.  Denies any palpitations.  Denies lower extremity swelling.  Patient continues to smoke approximately half pack per day.  Patient has been taking vitamin D supplement once a week, states she forgot to take last week.   Patient Active Problem List   Diagnosis Date Noted  . Hives 07/23/2016  . Right knee pain 05/18/2016  . Morbid obesity with BMI of 40.0-44.9, adult ([place]) 04/23/2016  . HTN (hypertension) 04/23/2016  . Encounter for preventative adult health care exam with abnormal findings 04/23/2016  . 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 Smoking status: Current Every Day Smoker    Packs/day: 0.50  . Smokeless tobacco: Never Used  Substance Use Topics  . Alcohol use: No   Review of Systems  Constitutional: Negative for chills, fatigue and fever.  HENT: Negative for congestion, ear pain, sinus pain and sore throat.   Eyes: Negative.   Respiratory: Negative for cough, shortness of breath and wheezing.   Cardiovascular: Negative for chest pain, palpitations and leg swelling.  Gastrointestinal: Negative for abdominal  pain, diarrhea, nausea and vomiting.  Genitourinary: Negative for dysuria, frequency and urgency.  Musculoskeletal: Negative for arthralgias and myalgias.  Skin: Negative for color change, pallor and rash.  Neurological: Negative for syncope, light-headedness and headaches.  Psychiatric/Behavioral: The patient is not nervous/anxious.       Objective:   Physical Exam  Constitutional:  She appears well-developed and well-nourished. No distress.  HENT:  Head: Normocephalic and atraumatic.  Eyes: EOM are normal. No scleral icterus.  Neck: Normal range of motion. Neck supple. No tracheal deviation present.  Cardiovascular: Normal rate, regular rhythm and normal heart sounds. No LE edema. Pulmonary/Chest: Effort normal and breath sounds normal. No respiratory distress. She has no wheezes. She has no rales.  Neurological: She is alert and oriented to person, place, and time. Gait normal  Skin: Skin is warm and dry. No pallor.  Psychiatric: She has a normal mood and affect. Her behavior is normal. Thought content normal.   Nursing note and vitals reviewed.    Vitals:   06/02/18 1616 06/02/18 1645  BP: (!) 170/98 (!) 164/92  Pulse: 91   Temp: 99 F (37.2 C)   SpO2: 96%     Assessment & Plan:   Essential hypertension -- patient will continue triamterene at current dose.  Stop amlodipine, as patient thinks this is not effective.  Patient given samples of Bystolic 10 mg once daily from clinic-we have enough samples to give patient a 78-LFYB supply of Bystolic.  Patient will keep eye on  blood pressure readings, and if they continue to run high she will let us know.  Patient also has upcoming appointment with cardiology due to her own history of hypertension and strong family history of cardiac issues.  Vitamin D deficiency - patient advised to continue vitamin D supplement at this time.  Once we have new vitamin D level and lab work we will adjust dose accordingly.  Cigarette smoker -  patient encouraged to consider quitting smoking, at least to work on weaning down.   Follow-up here in 2 3 weeks for recheck of blood pressure.  Keep scheduled follow-up with cardiology in November.

## 2018-06-03 ENCOUNTER — Encounter: Payer: Self-pay | Admitting: Family Medicine

## 2018-06-03 LAB — VITAMIN D 25 HYDROXY (VIT D DEFICIENCY, FRACTURES): VITD: 21.85 ng/mL — AB (ref 30.00–100.00)

## 2018-07-04 ENCOUNTER — Ambulatory Visit: Payer: BLUE CROSS/BLUE SHIELD | Admitting: Cardiovascular Disease

## 2018-08-13 HISTORY — PX: UTERINE FIBROID SURGERY: SHX826

## 2018-08-29 ENCOUNTER — Encounter: Payer: Self-pay | Admitting: Family Medicine

## 2018-08-29 ENCOUNTER — Ambulatory Visit (INDEPENDENT_AMBULATORY_CARE_PROVIDER_SITE_OTHER): Payer: BLUE CROSS/BLUE SHIELD

## 2018-08-29 ENCOUNTER — Ambulatory Visit (INDEPENDENT_AMBULATORY_CARE_PROVIDER_SITE_OTHER): Payer: BLUE CROSS/BLUE SHIELD | Admitting: Family Medicine

## 2018-08-29 VITALS — BP 164/94 | HR 80 | Temp 98.9°F | Resp 18 | Ht 63.0 in | Wt 251.2 lb

## 2018-08-29 DIAGNOSIS — R059 Cough, unspecified: Secondary | ICD-10-CM

## 2018-08-29 DIAGNOSIS — I10 Essential (primary) hypertension: Secondary | ICD-10-CM

## 2018-08-29 DIAGNOSIS — R05 Cough: Secondary | ICD-10-CM

## 2018-08-29 DIAGNOSIS — F419 Anxiety disorder, unspecified: Secondary | ICD-10-CM

## 2018-08-29 DIAGNOSIS — R062 Wheezing: Secondary | ICD-10-CM

## 2018-08-29 DIAGNOSIS — E559 Vitamin D deficiency, unspecified: Secondary | ICD-10-CM | POA: Diagnosis not present

## 2018-08-29 LAB — COMPREHENSIVE METABOLIC PANEL
ALK PHOS: 52 U/L (ref 39–117)
ALT: 14 U/L (ref 0–35)
AST: 13 U/L (ref 0–37)
Albumin: 3.7 g/dL (ref 3.5–5.2)
BUN: 11 mg/dL (ref 6–23)
CO2: 30 mEq/L (ref 19–32)
Calcium: 9.5 mg/dL (ref 8.4–10.5)
Chloride: 106 mEq/L (ref 96–112)
Creatinine, Ser: 1.01 mg/dL (ref 0.40–1.20)
GFR: 72.68 mL/min (ref >60.00)
Glucose, Bld: 106 mg/dL — ABNORMAL HIGH (ref 70–99)
Potassium: 3.6 mEq/L (ref 3.5–5.1)
Sodium: 144 mEq/L (ref 135–145)
TOTAL PROTEIN: 6.8 g/dL (ref 6.0–8.3)
Total Bilirubin: 0.5 mg/dL (ref 0.2–1.2)

## 2018-08-29 LAB — B12 AND FOLATE PANEL
Folate: 17.3 ng/mL (ref >5.9)
Vitamin B-12: 415 pg/mL (ref 211–911)

## 2018-08-29 LAB — CBC
HEMATOCRIT: 40.4 % (ref 36.0–46.0)
Hemoglobin: 13.2 g/dL (ref 12.0–15.0)
MCHC: 32.8 g/dL (ref 30.0–36.0)
MCV: 84.1 fl (ref 78.0–100.0)
PLATELETS: 186 10*3/uL (ref 150.0–400.0)
RBC: 4.8 Mil/uL (ref 3.87–5.11)
RDW: 15.1 % (ref 11.5–15.5)
WBC: 6.1 10*3/uL (ref 4.0–10.5)

## 2018-08-29 LAB — VITAMIN D 25 HYDROXY (VIT D DEFICIENCY, FRACTURES): VITD: 26.55 ng/mL — AB (ref 30.00–100.00)

## 2018-08-29 MED ORDER — ALBUTEROL SULFATE HFA 108 (90 BASE) MCG/ACT IN AERS: 2.0000 | INHALATION_SPRAY | Freq: Four times a day (QID) | RESPIRATORY_TRACT | 0 refills | Status: DC | PRN

## 2018-08-29 NOTE — Progress Notes (Signed)
Subjective:    Patient ID: Bianca Acosta, female    DOB: May 29, 1976, 43 y.o.   MRN: [id number]  HPI  Patient presents to clinic complaining of cough and wheezing that occurs mainly at nighttime over the past week or so.  Patient states she also has been having sensation of bloating and fullness in upper abdomen.  States after belching or passing gas that this feeling did seem improved.  Denies fever or chills.  Denies nausea/vomiting or diarrhea.  Denies chest pain, heart palpitations or lower extremity swelling.  Patient also reports of sensation of coughing and wheezing caused her to feel very anxious.  Patient states she usually does not feel anxiety often, but when she began coughing and wheezing she became very nervous.  Patient does have a history of vitamin D deficiency, will recheck vitamin D levels today after taking supplement.  Patient has been taking her blood pressure medicine since as prescribed.  Patient states she took BP medicine in the waiting room of clinic today, so it has not kicked in yet.  Patient Active Problem List   Diagnosis Date Noted  . Hives 07/23/2016  . Right knee pain 05/18/2016  . Morbid obesity with BMI of 40.0-44.9, adult ([place]) 04/23/2016  . HTN (hypertension) 04/23/2016  . Encounter for preventative adult health care exam with abnormal findings 04/23/2016  . 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 Smoking status: Current Every Day Smoker    Packs/day: 0.50  . Smokeless tobacco: Never Used  Substance Use Topics  . Alcohol use: No   Review of Systems  Constitutional: Negative for chills, fatigue and fever.  HENT: Negative for congestion, ear pain, sinus pain and sore throat.   Eyes: Negative.   Respiratory: +cough, shortness of breath and wheezing.   Cardiovascular: Negative for chest pain, palpitations and leg swelling.  Gastrointestinal:  Negative for abdominal pain, diarrhea, nausea and vomiting.  Genitourinary: Negative for dysuria, frequency and urgency.  Musculoskeletal: Negative for arthralgias and myalgias.  Skin: Negative for color change, pallor and rash.  Neurological: Negative for syncope, light-headedness and headaches.  Psychiatric/Behavioral: Episode of anxiety       Objective:   Physical Exam  Constitutional: She appears well-developed and well-nourished. No distress.  HENT:  Head: Normocephalic and atraumatic. Ears: Normal Nose/throat: Mild postnasal drip. Eyes: Pupils are equal, round, and reactive to light. EOM are normal. No scleral icterus.  Neck: Normal range of motion. Neck supple. No tracheal deviation present.  Cardiovascular: Normal rate, regular rhythm and normal heart sounds.  No lower extremity edema Pulmonary/Chest: Effort normal and breath sounds normal. No respiratory distress. She has no wheezes. She has no rales.  Abdominal: Soft. Bowel sounds are normal. There is no tenderness.  Neurological: She is alert and oriented to person, place, and time.  Gait normal  Skin: Skin is warm and dry. No pallor.  Psychiatric: She has a normal mood and affect. Her behavior is normal. Thought content normal.  Nursing note and vitals reviewed.   Vitals:   08/29/18 0954 08/29/18 1015  BP: (!) 172/98 (!) 164/94  Pulse: 80   Resp: 18   Temp: 98.9 F (37.2 C)   SpO2: 95%        Assessment & Plan:   Anxiety-suspect that this could have been related to episode of coughing and wheezing.  Otherwise patient's mentation is normal, she is very calm in  clinic.  Patient will monitor self for any more episodes of anxiety, if she continues to have anxiety she will let us know.  Cough/wheezing- lungs sound clear on exam, but we will get chest x-ray in clinic to rule out any underlying cause.  Patient given albuterol inhaler to use prior to bedtime or as needed for any wheezing.  Patient is a  smoker.  Essential hypertension- patients blood pressure is elevated in office.  She has upcoming appointment with cardiology.  In the past she was on amlodipine in addition to the triamterene hydrochlorothiazide, but she would prefer not to have to take the amlodipine.  Lab work in clinic today collected to follow-up on vitamin D deficiency.  We will also check CBC, CMP, thyroid panel, B12 to see if any of these things are abnormal could be contributing to patient's symptoms.  Patient will follow-up in 2 to 4 weeks for recheck on cough/wheezing, anxiety, blood pressure.  Advised she will be contacted in regards to her x-ray and lab results.

## 2018-08-30 LAB — THYROID PANEL WITH TSH
Free Thyroxine Index: 2.5 (ref 1.4–3.8)
T3 Uptake: 37 % — ABNORMAL HIGH (ref 22–35)
T4, Total: 6.8 ug/dL (ref 5.1–11.9)
TSH: 0.43 mIU/L

## 2018-09-09 ENCOUNTER — Telehealth: Payer: Self-pay

## 2018-09-09 ENCOUNTER — Telehealth: Payer: Self-pay | Admitting: Lab

## 2018-09-09 DIAGNOSIS — R140 Abdominal distension (gaseous): Secondary | ICD-10-CM

## 2018-09-09 MED ORDER — OMEPRAZOLE 20 MG PO CPDR: 20.0000 mg | DELAYED_RELEASE_CAPSULE | Freq: Every day | ORAL | 3 refills | Status: DC

## 2018-09-09 NOTE — Telephone Encounter (Signed)
Pt called back and is aware of the message below. She just started taking probiotics last night and took one today, so far so good. She has not had a flare up yet. She would like to know will this help her and is this okay for her to take? Call back @ [phone]

## 2018-09-09 NOTE — Telephone Encounter (Signed)
Copied from [place] [id number]. Topic: General - Other >> Sep 08, 2018  5:41 PM Windy Kalata wrote: Reason for CRM: Patient would like to know what the prescription was that Dr Chauncy Passy was going to call in for the bloating of her stomach, she states that it was not called in on;y the Allbuterol.   Best call back is [phone]

## 2018-09-09 NOTE — Telephone Encounter (Signed)
Called Pt to tell her that her Rx was sent in to the pharmacy  No answer and VM was full will try later

## 2018-09-09 NOTE — Addendum Note (Signed)
Addended by: Philis Nettle on: 09/09/2018 01:05 PM   Modules accepted: Orders

## 2018-09-09 NOTE — Telephone Encounter (Signed)
Pt called [facility] Reason for CRM: Patient would like to know what the prescription was that Dr Chauncy Passy was going to call in for the bloating of her stomach, she states that it was not called in on;y the Allbuterol.   Best call back is [phone]

## 2018-09-12 ENCOUNTER — Encounter: Payer: Self-pay | Admitting: Cardiovascular Disease

## 2018-09-12 ENCOUNTER — Ambulatory Visit (INDEPENDENT_AMBULATORY_CARE_PROVIDER_SITE_OTHER): Payer: BLUE CROSS/BLUE SHIELD | Admitting: Cardiovascular Disease

## 2018-09-12 VITALS — BP 182/102 | HR 72 | Ht 67.0 in | Wt 250.5 lb

## 2018-09-12 DIAGNOSIS — R0602 Shortness of breath: Secondary | ICD-10-CM

## 2018-09-12 DIAGNOSIS — Z720 Tobacco use: Secondary | ICD-10-CM | POA: Diagnosis not present

## 2018-09-12 DIAGNOSIS — I10 Essential (primary) hypertension: Secondary | ICD-10-CM | POA: Diagnosis not present

## 2018-09-12 DIAGNOSIS — G4730 Sleep apnea, unspecified: Secondary | ICD-10-CM

## 2018-09-12 MED ORDER — SPIRONOLACTONE 25 MG PO TABS: 25.0000 mg | ORAL_TABLET | Freq: Every day | ORAL | 3 refills | Status: DC

## 2018-09-12 MED ORDER — CHLORTHALIDONE 25 MG PO TABS: 25.0000 mg | ORAL_TABLET | Freq: Every day | ORAL | 3 refills | Status: DC

## 2018-09-12 NOTE — Patient Instructions (Addendum)
Medication Instructions:  Stop the triamterene-hydrochlorothiazide (Maxzide) START Chlorthalidone 25 mg daily START Spironolactone 25 mg daily Please do not take any NSAIDS  If you need a refill on your cardiac medications before your next appointment, please call your pharmacy.   Lab work: Your provider would like for you to return in one week to have the following labs drawn: BMET. Please go to the [facility] entrance and check in at the front desk. You do not need an appointment.   Testing/Procedures: Your physician has requested that you have an echocardiogram. Echocardiography is a painless test that uses sound waves to create images of your heart. It provides your doctor with information about the size and shape of your heart and how well your heart's chambers and valves are working. You may receive an ultrasound enhancing agent through an IV if needed to better visualize your heart during the echo.This procedure takes approximately one hour. There are no restrictions for this procedure. This will take place at the [facility] clinic.    Follow-Up: At [facility], you and your health needs are our priority.  As part of our continuing mission to provide you with exceptional heart care, we have created designated Provider Care Teams.  These Care Teams include your primary Cardiologist (physician) and Advanced Practice Providers (APPs -  Physician Assistants and Nurse Practitioners) who all work together to provide you with the care you need, when you need it. You will need a follow up appointment in 2 months. You may see Dr. Fletcher Anon or one of the following Advanced Practice Providers on your designated Care Team:   Murray Hodgkins, NP Christell Faith, PA-C . Marrianne Mood, PA-C  Any Other Special Instructions Will Be Listed Below (If Applicable). A referral has been placed for a Pulmonologist.    Low-Sodium Eating Plan Sodium, which is an element that makes up salt,  helps you maintain a healthy balance of fluids in your body. Too much sodium can increase your blood pressure and cause fluid and waste to be held in your body. Your health care provider or dietitian may recommend following this plan if you have high blood pressure (hypertension), kidney disease, liver disease, or heart failure. Eating less sodium can help lower your blood pressure, reduce swelling, and protect your heart, liver, and kidneys. What are tips for following this plan? General guidelines  Most people on this plan should limit their sodium intake to 1,500-2,000 mg (milligrams) of sodium each day. Reading food labels   The Nutrition Facts label lists the amount of sodium in one serving of the food. If you eat more than one serving, you must multiply the listed amount of sodium by the number of servings.  Choose foods with less than 140 mg of sodium per serving.  Avoid foods with 300 mg of sodium or more per serving. Shopping  Look for lower-sodium products, often labeled as "low-sodium" or "no salt added."  Always check the sodium content even if foods are labeled as "unsalted" or "no salt added".  Buy fresh foods. ? Avoid canned foods and premade or frozen meals. ? Avoid canned, cured, or processed meats  Buy breads that have less than 80 mg of sodium per slice. Cooking  Eat more home-cooked food and less restaurant, buffet, and fast food.  Avoid adding salt when cooking. Use salt-free seasonings or herbs instead of table salt or sea salt. Check with your health care provider or pharmacist before using salt substitutes.  Cook with plant-based  oils, such as canola, sunflower, or olive oil. Meal planning  When eating at a restaurant, ask that your food be prepared with less salt or no salt, if possible.  Avoid foods that contain MSG (monosodium glutamate). MSG is sometimes added to [place] food, bouillon, and some canned foods. What foods are recommended? The items  listed may not be a complete list. Talk with your dietitian about what dietary choices are best for you. Grains Low-sodium cereals, including oats, puffed wheat and rice, and shredded wheat. Low-sodium crackers. Unsalted rice. Unsalted pasta. Low-sodium bread. Whole-grain breads and whole-grain pasta. Vegetables Fresh or frozen vegetables. "No salt added" canned vegetables. "No salt added" tomato sauce and paste. Low-sodium or reduced-sodium tomato and vegetable juice. Fruits Fresh, frozen, or canned fruit. Fruit juice. Meats and other protein foods Fresh or frozen (no salt added) meat, poultry, seafood, and fish. Low-sodium canned tuna and salmon. Unsalted nuts. Dried peas, beans, and lentils without added salt. Unsalted canned beans. Eggs. Unsalted nut butters. Dairy Milk. Soy milk. Cheese that is naturally low in sodium, such as ricotta cheese, fresh mozzarella, or Swiss cheese Low-sodium or reduced-sodium cheese. Cream cheese. Yogurt. Fats and oils Unsalted butter. Unsalted margarine with no trans fat. Vegetable oils such as canola or olive oils. Seasonings and other foods Fresh and dried herbs and spices. Salt-free seasonings. Low-sodium mustard and ketchup. Sodium-free salad dressing. Sodium-free light mayonnaise. Fresh or refrigerated horseradish. Lemon juice. Vinegar. Homemade, reduced-sodium, or low-sodium soups. Unsalted popcorn and pretzels. Low-salt or salt-free chips. What foods are not recommended? The items listed may not be a complete list. Talk with your dietitian about what dietary choices are best for you. Grains Instant hot cereals. Bread stuffing, pancake, and biscuit mixes. Croutons. Seasoned rice or pasta mixes. Noodle soup cups. Boxed or frozen macaroni and cheese. Regular salted crackers. Self-rising flour. Vegetables Sauerkraut, pickled vegetables, and relishes. Olives. [place] fries. Onion rings. Regular canned vegetables (not low-sodium or reduced-sodium). Regular  canned tomato sauce and paste (not low-sodium or reduced-sodium). Regular tomato and vegetable juice (not low-sodium or reduced-sodium). Frozen vegetables in sauces. Meats and other protein foods Meat or fish that is salted, canned, smoked, spiced, or pickled. Bacon, ham, sausage, hotdogs, corned beef, chipped beef, packaged lunch meats, salt pork, jerky, pickled herring, anchovies, regular canned tuna, sardines, salted nuts. Dairy Processed cheese and cheese spreads. Cheese curds. Blue cheese. Feta cheese. String cheese. Regular cottage cheese. Buttermilk. Canned milk. Fats and oils Salted butter. Regular margarine. Ghee. Bacon fat. Seasonings and other foods Onion salt, garlic salt, seasoned salt, table salt, and sea salt. Canned and packaged gravies. Worcestershire sauce. Tartar sauce. Barbecue sauce. Teriyaki sauce. Soy sauce, including reduced-sodium. Steak sauce. Fish sauce. Oyster sauce. Cocktail sauce. Horseradish that you find on the shelf. Regular ketchup and mustard. Meat flavorings and tenderizers. Bouillon cubes. Hot sauce and Tabasco sauce. Premade or packaged marinades. Premade or packaged taco seasonings. Relishes. Regular salad dressings. Salsa. Potato and tortilla chips. Corn chips and puffs. Salted popcorn and pretzels. Canned or dried soups. Pizza. Frozen entrees and pot pies. Summary  Eating less sodium can help lower your blood pressure, reduce swelling, and protect your heart, liver, and kidneys.  Most people on this plan should limit their sodium intake to 1,500-2,000 mg (milligrams) of sodium each day.  Canned, boxed, and frozen foods are high in sodium. Restaurant foods, fast foods, and pizza are also very high in sodium. You also get sodium by adding salt to food.  Try to cook at home, eat  more fresh fruits and vegetables, and eat less fast food, canned, processed, or prepared foods. This information is not intended to replace advice given to you by your health care  provider. Make sure you discuss any questions you have with your health care provider. Document Released: 01/19/2002 Document Revised: 07/23/2016 Document Reviewed: 07/23/2016 Elsevier Interactive Patient Education  2019 [employer].     Steps to Quit Smoking  Smoking tobacco can be bad for your health. It can also affect almost every organ in your body. Smoking puts you and people around you at risk for many serious long-lasting (chronic) diseases. Quitting smoking is hard, but it is one of the best things that you can do for your health. It is never too late to quit. What are the benefits of quitting smoking? When you quit smoking, you lower your risk for getting serious diseases and conditions. They can include:  Lung cancer or lung disease.  Heart disease.  Stroke.  Heart attack.  Not being able to have children (infertility).  Weak bones (osteoporosis) and broken bones (fractures). If you have coughing, wheezing, and shortness of breath, those symptoms may get better when you quit. You may also get sick less often. If you are pregnant, quitting smoking can help to lower your chances of having a baby of low birth weight. What can I do to help me quit smoking? Talk with your doctor about what can help you quit smoking. Some things you can do (strategies) include:  Quitting smoking totally, instead of slowly cutting back how much you smoke over a period of time.  Going to in-person counseling. You are more likely to quit if you go to many counseling sessions.  Using resources and support systems, such as: ? Database administrator with a Social worker. ? Phone quitlines. ? Careers information officer. ? Support groups or group counseling. ? Text messaging programs. ? Mobile phone apps or applications.  Taking medicines. Some of these medicines may have nicotine in them. If you are pregnant or breastfeeding, do not take any medicines to quit smoking unless your doctor says it is okay.  Talk with your doctor about counseling or other things that can help you. Talk with your doctor about using more than one strategy at the same time, such as taking medicines while you are also going to in-person counseling. This can help make quitting easier. What things can I do to make it easier to quit? Quitting smoking might feel very hard at first, but there is a lot that you can do to make it easier. Take these steps:  Talk to your family and friends. Ask them to support and encourage you.  Call phone quitlines, reach out to support groups, or work with a Social worker.  Ask people who smoke to not smoke around you.  Avoid places that make you want (trigger) to smoke, such as: ? Bars. ? Parties. ? Smoke-break areas at work.  Spend time with people who do not smoke.  Lower the stress in your life. Stress can make you want to smoke. Try these things to help your stress: ? Getting regular exercise. ? Deep-breathing exercises. ? Yoga. ? Meditating. ? Doing a body scan. To do this, close your eyes, focus on one area of your body at a time from head to toe, and notice which parts of your body are tense. Try to relax the muscles in those areas.  Download or buy apps on your mobile phone or tablet that can help you stick  to your quit plan. There are many free apps, such as QuitGuide from the [employer] Office manager for Disease Control and Prevention). You can find more support from smokefree.gov and other websites. This information is not intended to replace advice given to you by your health care provider. Make sure you discuss any questions you have with your health care provider. Document Released: 05/26/2009 Document Revised: 03/27/2016 Document Reviewed: 12/14/2014 Elsevier Interactive Patient Education  2019 [employer].

## 2018-09-12 NOTE — Progress Notes (Signed)
Cardiology Office Note   Date:  09/12/2018   ID:  Bianca Acosta, DOB 1976/05/04, MRN [id number]  PCP:  Bianca Spikes, DO  Cardiologist:  Bianca Sacramento, MD    Chief Complaint  Patient presents with  . other    Ref by Bianca Nettle, NP for HTN. Meds reviewed by the pt. verbally. Pt. c/o elevated blood pressure and shortness of breath with over exertion.       History of Present Illness: Bianca Acosta is a 43 y.o. female who was referred by Bianca Nettle, FNP for strong family history of HTN and heart disease.  She has a known history of HTN and brain aneurysum.  She was last seen by Bianca Nettle, FNP on 08/29/2018 for cough, wheezing and anxiety. At that visit her BP was 172/98 initially and dropped to 164/94 about 15 minutes later. She reports prolonged history of difficult to control hypertension which started in early 33s.  She has strong family history of hypertension.  She is currently on Bystolic and triamterene hydrochlorothiazide.  She did not tolerate amlodipine well as it made her feel sick on her stomach.  She smokes half a pack per day and drinks alcohol only rarely.  She does have obesity and her weight has been stable around 250 pounds. She frequently takes Aleve for headache and menstrual pain.  She describes dyspnea with minimal exertion without chest pain.  No orthopnea, PND or leg edema.  She snores loudly at night but has not been tested for sleep apnea.    Past Medical History:  Diagnosis Date  . Brain aneurysm 06/29/2015  . Chicken pox   . Headache   . Hypertension     Past Surgical History:  Procedure Laterality Date  . CRANIOTOMY Right 06/29/2015   Procedure: CRANIOTOMY INTRACRANIAL ANEURYSM CLIPPING;  Surgeon: Kevan Ny Ditty, MD;  Location: [facility];  Service: Neurosurgery;  Laterality: Right;  . IR GENERIC HISTORICAL  05/25/2016   IR ANGIO INTRA EXTRACRAN SEL INTERNAL CAROTID BILAT MOD SED 05/25/2016 Bianca Lose, MD [facility]    . IR GENERIC HISTORICAL  06/29/2015   IR ANGIO INTRA EXTRACRAN SEL INTERNAL CAROTID BILAT MOD SED 06/29/2015 Bianca Lose, MD [facility]  . IR GENERIC HISTORICAL  06/29/2015   IR ANGIO VERTEBRAL SEL VERTEBRAL UNI L MOD SED 06/29/2015 Bianca Lose, MD [facility]  . IR GENERIC HISTORICAL  06/29/2015   IR 3D INDEPENDENT WKST 06/29/2015 Bianca Lose, MD [facility]     Current Outpatient Medications  Medication Sig Dispense Refill  . albuterol (PROVENTIL HFA;VENTOLIN HFA) 108 (90 Base) MCG/ACT inhaler Inhale 2 puffs into the lungs every 6 (six) hours as needed for wheezing or shortness of breath. 1 Inhaler 0  . omeprazole (PRILOSEC) 20 MG capsule Take 1 capsule (20 mg total) by mouth daily. 30 capsule 3  . triamterene-hydrochlorothiazide (MAXZIDE-25) 37.5-25 MG tablet Take 1 tablet by mouth daily. 90 tablet 3   No current facility-administered medications for this visit.     Allergies:   Augmentin [amoxicillin-pot clavulanate] and Ciprofloxacin    Social History:  The patient  reports that she has been smoking. She has been smoking about 0.50 packs per day. She has never used smokeless tobacco. She reports that she does not drink alcohol or use drugs.   Family History:  The patient's family history includes Hypertension in her father, maternal aunt, maternal grandfather, maternal grandmother, maternal uncle, mother, paternal aunt, paternal grandfather, paternal grandmother, and paternal uncle.  ROS:  Please see the history of present illness.   Otherwise, review of systems are positive for none.   All other systems are reviewed and negative.    PHYSICAL EXAM: VS:  Ht 5\' 7"  (1.702 m)   Wt 250 lb 8 oz (113.6 kg)   BMI 39.23 kg/m  , BMI Body mass index is 39.23 kg/m. GEN: Well nourished, well developed, in no acute distress  HEENT: normal  Neck: no JVD, carotid bruits, or masses Cardiac: RRR; no murmurs, rubs, or gallops,no edema  Respiratory:  clear to  auscultation bilaterally, normal work of breathing GI: soft, nontender, nondistended, + BS MS: no deformity or atrophy  Skin: warm and dry, no rash Neuro:  Strength and sensation are intact Psych: euthymic mood, full affect   EKG:  EKG is ordered today. The ekg ordered today demonstrates normal sinus rhythm with borderline LVH, diffuse inferior and anterolateral T wave changes suggestive of ischemia, borderline prolonged QT interval with left atrial enlargement.   Recent Labs: 08/29/2018: ALT 14; BUN 11; Creatinine, Ser 1.01; Hemoglobin 13.2; Platelets 186.0; Potassium 3.6; Sodium 144; TSH 0.43    Lipid Panel    Component Value Date/Time   CHOL 134 05/16/2018 0927   TRIG 81 05/16/2018 0927   HDL 49 (L) 05/16/2018 0927   CHOLHDL 2.7 05/16/2018 0927   VLDL 16.2 04/23/2016 1011   LDLCALC 69 05/16/2018 0927      Wt Readings from Last 3 Encounters:  09/12/18 250 lb 8 oz (113.6 kg)  08/29/18 251 lb 3.2 oz (113.9 kg)  06/02/18 245 lb 9.6 oz (111.4 kg)        ASSESSMENT AND PLAN:  1.  Refractory hypertension: The patient has prolonged history of hypertension and strong family history of difficult to control hypertension.  I suspect that she likely has essential hypertension.  I discussed with her the importance of managing her high blood pressure which seems to be extremely elevated today. I discussed the importance of low-sodium diet and provided her with instructions. I also advised her to avoid using NSAIDs and use acetaminophen as possible. I do think we have to rule out underlying sleep apnea as a contributing factor for difficult to control blood pressure.  I am referring her to pulmonary for evaluation of this.  In terms of her medications, I am going to switch her from hydrochlorothiazide to chlorthalidone 25 mg daily and will also add spironolactone 25 mg once daily.  Check basic metabolic profile in 1 week.  We can consider adding an ARB if blood pressure remains  elevated. Continue Bystolic.  2.  Exertional dyspnea: Could be due to significant diastolic heart failure related to uncontrolled hypertension.  I am going to obtain an echocardiogram as a first step.  Her EKG is abnormal with T wave changes suggestive of ischemia in addition to prolonged QT interval.  She will require further ischemic cardiac evaluation but we need to get her blood pressure controlled first.  3.  Tobacco use: I discussed with her the importance of smoking cessation.  4.  Possible sleep apnea: I referred her to pulmonary for evaluation.   Current medicines are reviewed at length with the patient today.  The patient has concerns regarding medicines especially side effects of antihypertensive medications.  I discussed with her the importance of trying different medications until we find with suitable for her.    Disposition:   FU with me in 2 months  Signed, Bianca Sacramento, MD  09/12/2018 11:59 AM  [facility]

## 2018-09-16 ENCOUNTER — Institutional Professional Consult (permissible substitution): Payer: BLUE CROSS/BLUE SHIELD | Admitting: Internal Medicine

## 2018-09-17 ENCOUNTER — Telehealth: Payer: Self-pay | Admitting: Lab

## 2018-09-17 DIAGNOSIS — R140 Abdominal distension (gaseous): Secondary | ICD-10-CM

## 2018-09-17 MED ORDER — FORTIFY PROBIOTIC WOMENS EX ST PO CPDR: 1.0000 | DELAYED_RELEASE_CAPSULE | Freq: Every day | ORAL | 2 refills | Status: AC

## 2018-09-17 MED ORDER — OMEPRAZOLE 20 MG PO CPDR: 20.0000 mg | DELAYED_RELEASE_CAPSULE | Freq: Every day | ORAL | 3 refills | Status: DC

## 2018-09-17 NOTE — Telephone Encounter (Signed)
Called and spoke to Pt about medication

## 2018-09-17 NOTE — Telephone Encounter (Signed)
Omeprazole was sent on 09/09/2018  I will resend again.

## 2018-09-17 NOTE — Telephone Encounter (Signed)
Pt called [facility] for CRM: Patient would like to know what the prescription was that Dr Chauncy Passy was going to call in for the bloating of her stomach, she states that it was not called in on;y the Allbuterol.   Best call back is [phone]

## 2018-09-17 NOTE — Telephone Encounter (Signed)
Called Pt and she stated she picked up the Omeprazole, but was not sure if she should take it everyday and was not sure if that was for the bloating. I told her she need to take it everyday and it should help with her stomach.

## 2018-09-17 NOTE — Telephone Encounter (Signed)
Yes take every day, yes it will help bloating

## 2018-09-22 ENCOUNTER — Other Ambulatory Visit
Admission: RE | Admit: 2018-09-22 | Discharge: 2018-09-22 | Disposition: A | Discharge status: Home / Self Care | Payer: BLUE CROSS/BLUE SHIELD | Source: Ambulatory Visit | Attending: Cardiovascular Disease | Admitting: Cardiovascular Disease

## 2018-09-22 DIAGNOSIS — R0602 Shortness of breath: Secondary | ICD-10-CM | POA: Insufficient documentation

## 2018-09-22 LAB — BASIC METABOLIC PANEL
Anion gap: 5 (ref 5–15)
BUN: 18 mg/dL (ref 6–20)
CHLORIDE: 104 mmol/L (ref 98–111)
CO2: 30 mmol/L (ref 22–32)
CREATININE: 1.07 mg/dL — AB (ref 0.44–1.00)
Calcium: 8.9 mg/dL (ref 8.9–10.3)
GFR calc Af Amer: >60 mL/min (ref >60)
GFR calc non Af Amer: >60 mL/min (ref >60)
Glucose, Bld: 113 mg/dL — ABNORMAL HIGH (ref 70–99)
Potassium: 3.3 mmol/L — ABNORMAL LOW (ref 3.5–5.1)
SODIUM: 139 mmol/L (ref 135–145)

## 2018-09-23 ENCOUNTER — Telehealth: Payer: Self-pay | Admitting: *Deleted

## 2018-09-23 DIAGNOSIS — I10 Essential (primary) hypertension: Secondary | ICD-10-CM

## 2018-09-23 MED ORDER — POTASSIUM CHLORIDE CRYS ER 20 MEQ PO TBCR: 20.0000 meq | EXTENDED_RELEASE_TABLET | Freq: Every day | ORAL | 3 refills | Status: DC

## 2018-09-23 NOTE — Telephone Encounter (Signed)
Patient made aware of results and verbalized understanding.  The patient will come back in 3 weeks to have a BMET drawn. Potassium 20 mEq has been sent to pharmacy.

## 2018-09-23 NOTE — Telephone Encounter (Signed)
-----   Message from Wellington Hampshire, MD sent at 09/22/2018  4:33 PM EST ----- Stable labs but mild hypokalemia.  Add K-Dur 20 mEq once daily.  Repeat basic metabolic profile in 3 weeks.

## 2018-09-26 ENCOUNTER — Encounter: Payer: Self-pay | Admitting: Family Medicine

## 2018-09-26 ENCOUNTER — Ambulatory Visit (INDEPENDENT_AMBULATORY_CARE_PROVIDER_SITE_OTHER): Payer: BLUE CROSS/BLUE SHIELD | Admitting: Family Medicine

## 2018-09-26 ENCOUNTER — Encounter: Payer: Self-pay | Admitting: Internal Medicine

## 2018-09-26 ENCOUNTER — Ambulatory Visit (INDEPENDENT_AMBULATORY_CARE_PROVIDER_SITE_OTHER): Payer: BLUE CROSS/BLUE SHIELD | Admitting: Internal Medicine

## 2018-09-26 VITALS — BP 162/88 | HR 82 | Ht 63.0 in | Wt 245.4 lb

## 2018-09-26 VITALS — BP 168/96 | HR 84 | Temp 98.3°F | Resp 18 | Ht 63.0 in | Wt 244.8 lb

## 2018-09-26 DIAGNOSIS — K5900 Constipation, unspecified: Secondary | ICD-10-CM | POA: Diagnosis not present

## 2018-09-26 DIAGNOSIS — G4719 Other hypersomnia: Secondary | ICD-10-CM | POA: Diagnosis not present

## 2018-09-26 DIAGNOSIS — E559 Vitamin D deficiency, unspecified: Secondary | ICD-10-CM

## 2018-09-26 DIAGNOSIS — F17210 Nicotine dependence, cigarettes, uncomplicated: Secondary | ICD-10-CM | POA: Diagnosis not present

## 2018-09-26 DIAGNOSIS — Z720 Tobacco use: Secondary | ICD-10-CM

## 2018-09-26 DIAGNOSIS — I10 Essential (primary) hypertension: Secondary | ICD-10-CM | POA: Diagnosis not present

## 2018-09-26 DIAGNOSIS — R140 Abdominal distension (gaseous): Secondary | ICD-10-CM

## 2018-09-26 DIAGNOSIS — F419 Anxiety disorder, unspecified: Secondary | ICD-10-CM

## 2018-09-26 MED ORDER — BISACODYL 5 MG PO TBEC: 5.0000 mg | DELAYED_RELEASE_TABLET | Freq: Every day | ORAL | 1 refills | Status: DC | PRN

## 2018-09-26 MED ORDER — NEBIVOLOL HCL 10 MG PO TABS: 10.0000 mg | ORAL_TABLET | Freq: Every day | ORAL | 3 refills | Status: DC

## 2018-09-26 MED ORDER — VITAMIN D 25 MCG (1000 UNIT) PO TABS: 1000.0000 [IU] | ORAL_TABLET | Freq: Every day | ORAL | 3 refills | Status: AC

## 2018-09-26 NOTE — Progress Notes (Signed)
Name: Bianca Acosta MRN: [id number] DOB: [birth year]     CONSULTATION DATE: 2.14.2020 REFERRING MD : Fletcher Anon  CHIEF COMPLAINT: excessive daytime sleepiness  STUDIES:   08/2018  CXR independently reviewed by Me No acute findings No penumonia No edema No effusions   HISTORY OF PRESENT ILLNESS: Patient initially evaluated  by Cardiology for HTN now 200/110 Has been having less energy last several months  Smokes 1/2 ppd for 20 years  Patient is seen today for problems and issues with sleep  Patient  has been having sleep problems for many years  Patient has been having excessive daytime sleepiness for a long time  Patient has been having extreme fatigue and tiredness, lack of energy  +  very Loud snoring every night  + struggling breathe at night and gasps for air  EPWORTH SLEEP SCORE 12  Discussed sleep data and reviewed with patient.  Encouraged proper weight management.  Discussed driving precautions and its relationship with hypersomnolence.  Discussed operating dangerous equipment and its relationship with hypersomnolence.  Discussed sleep hygiene, and benefits of a fixed sleep waked time.  The importance of getting eight or more hours of sleep discussed with patient.  Discussed limiting the use of the computer and television before bedtime.  Decrease naps during the day, so night time sleep will become enhanced.  Limit caffeine, and sleep deprivation.  HTN, stroke, and heart failure are potential risk factors.    Smoking Assessment and Cessation Counseling   Upon further questioning, Patient smokes 1/2 ppd  I have advised patient to quit/stop smoking as soon as possible due to high risk for multiple medical problems  Patient is NOT willing to quit smoking  I have advised patient that we can assist and have options of Nicotine replacement therapy. I also advised patient on behavioral therapy and can provide oral medication therapy in conjunction with  the other therapies  Follow up next Office visit  for assessment of smoking cessation  Smoking cessation counseling advised for 4 minutes    PAST MEDICAL HISTORY :   has a past medical history of Brain aneurysm (06/29/2015), Chicken pox, Headache, and Hypertension.  has a past surgical history that includes Craniotomy (Right, 06/29/2015); ir generic historical (05/25/2016); ir generic historical (06/29/2015); ir generic historical (06/29/2015); and ir generic historical (06/29/2015). Prior to Admission medications   Medication Sig Start Date End Date Taking? Authorizing Provider  albuterol (PROVENTIL HFA;VENTOLIN HFA) 108 (90 Base) MCG/ACT inhaler Inhale 2 puffs into the lungs every 6 (six) hours as needed for wheezing or shortness of breath. 08/29/18  Yes Guse, Jacquelynn Cree, FNP  bisacodyl (DULCOLAX) 5 MG EC tablet Take 1 tablet (5 mg total) by mouth daily as needed for moderate constipation. 09/26/18  Yes Guse, Jacquelynn Cree, FNP  chlorthalidone (HYGROTON) 25 MG tablet Take 1 tablet (25 mg total) by mouth daily. 09/12/18 12/11/18 Yes Wellington Hampshire, MD  cholecalciferol (VITAMIN D3) 25 MCG (1000 UT) tablet Take 1 tablet (1,000 Units total) by mouth daily. 09/26/18  Yes Guse, Jacquelynn Cree, FNP  nebivolol (BYSTOLIC) 10 MG tablet Take 1 tablet (10 mg total) by mouth daily. 09/26/18  Yes Guse, Jacquelynn Cree, FNP  omeprazole (PRILOSEC) 20 MG capsule Take 1 capsule (20 mg total) by mouth daily. 09/17/18  Yes Guse, Jacquelynn Cree, FNP  potassium chloride SA (K-DUR,KLOR-CON) 20 MEQ tablet Take 1 tablet (20 mEq total) by mouth daily. 09/23/18  Yes Wellington Hampshire, MD  Probiotic Product (FORTIFY PROBIOTIC WOMENS EX ST) CPDR Take  1 capsule by mouth daily. 09/17/18  Yes Guse, Jacquelynn Cree, FNP  spironolactone (ALDACTONE) 25 MG tablet Take 1 tablet (25 mg total) by mouth daily. 09/12/18 12/11/18 Yes Wellington Hampshire, MD   Allergies  Allergen Reactions  . Augmentin [Amoxicillin-Pot Clavulanate] Hives  . Ciprofloxacin Itching    Patient  states she starts itching from inside or internal.    FAMILY HISTORY:  family history includes Hypertension in her father, maternal aunt, maternal grandfather, maternal grandmother, maternal uncle, mother, paternal aunt, paternal grandfather, paternal grandmother, and paternal uncle. SOCIAL HISTORY:  reports that she has been smoking. She has a 11.00 pack-year smoking history. She has never used smokeless tobacco. She reports that she does not drink alcohol or use drugs.  REVIEW OF SYSTEMS:   Constitutional: Negative for fever, chills, weight loss, malaise/fatigue and diaphoresis.  HENT: Negative for hearing loss, ear pain, nosebleeds, congestion, sore throat, neck pain, tinnitus and ear discharge.   Eyes: Negative for blurred vision, double vision, photophobia, pain, discharge and redness.  Respiratory: Negative for cough, hemoptysis, sputum production, shortness of breath, wheezing and stridor.   Cardiovascular: Negative for chest pain, palpitations, orthopnea, claudication, leg swelling and PND.  Gastrointestinal: Negative for heartburn, nausea, vomiting, abdominal pain, diarrhea, constipation, blood in stool and melena.  Genitourinary: Negative for dysuria, urgency, frequency, hematuria and flank pain.  Musculoskeletal: Negative for myalgias, back pain, joint pain and falls.  Skin: Negative for itching and rash.  Neurological: Negative for dizziness, tingling, tremors, sensory change, speech change, focal weakness, seizures, loss of consciousness, weakness and headaches.  Endo/Heme/Allergies: Negative for environmental allergies and polydipsia. Does not bruise/bleed easily.  ALL OTHER ROS ARE NEGATIVE   BP (!) 210/110 (BP Location: Left Arm, Cuff Size: Normal)   Pulse 82   Ht 5\' 3"  (1.6 m)   Wt 245 lb 6.4 oz (111.3 kg)   SpO2 98%   BMI 43.47 kg/m   Physical Examination:   GENERAL:NAD, no fevers, chills, no weakness no fatigue HEAD: Normocephalic, atraumatic.  EYES: Pupils  equal, round, reactive to light. Extraocular muscles intact. No scleral icterus.  MOUTH: Moist mucosal membrane.   EAR, NOSE, THROAT: Clear without exudates. No external lesions.  NECK: Supple. No thyromegaly. No nodules. No JVD.  PULMONARY:CTA B/L no wheezes, no crackles, no rhonchi CARDIOVASCULAR: S1 and S2. Regular rate and rhythm. No murmurs, rubs, or gallops. No edema.  GASTROINTESTINAL: Soft, nontender, nondistended. No masses. Positive bowel sounds.  MUSCULOSKELETAL: No swelling, clubbing, or edema. Range of motion full in all extremities.  NEUROLOGIC: Cranial nerves II through XII are intact. No gross focal neurological deficits.  SKIN: No ulceration, lesions, rashes, or cyanosis. Skin warm and dry. Turgor intact.  PSYCHIATRIC: Mood, affect within normal limits. The patient is awake, alert and oriented x 3. Insight, judgment intact.      ASSESSMENT / PLAN: 43 yo pleasant AAF with signs and symptoms of snoring and witnessed apneas findings highly suggest underlying obstructive sleep apnea ini setting of intermittent reactive airways disease from ongoing smoking with underlying deconditioned state and morbid obesity  Signs of OSA She needs sleep study ASAP and will need to start threapy   Smoking cessation strongly advised   Obesity -recommend significant weight loss -recommend changing diet  Deconditioned state -Recommend increased daily activity and exercise   HTN-patient just took her meds Advised to follow up with PCP ASAP.   Patient/Family are satisfied with Plan of action and management. All questions answered Follow up in 3 months   Annabeth Tortora  Patricia Pesa, M.D.  [facility]  Medical Director [place] Director [facility] Cardio-Pulmonary Department

## 2018-09-26 NOTE — Progress Notes (Signed)
Subjective:    Patient ID: Bianca Acosta, female    DOB: 12/02/1975, 43 y.o.   MRN: [id number]  HPI   Patient presents to clinic for follow-up of blood pressure, vitamin D deficiency, constipation/bloating and anxiety.  Blood pressure has been better controlled since changing medications as recommended by cardiology.  Changes are as follows: switch her from hydrochlorothiazide to chlorthalidone 25 mg daily and will also add spironolactone 25 mg once daily.  Check basic metabolic profile in 1 week.  We can consider adding an ARB if blood pressure remains elevated. Continue Bystolic.  Patient states she also has upcoming echocardiogram to further evaluate some exertional dyspnea as well as possible diastolic heart failure related to her hypertension.  Also has upcoming sleep study planned.  Patient did complete taking vitamin D at the high dose of 50,000 units once per week.  She will now start 1000 units/day, she was unsure if.  She ordered offline was correct dose so we will send in for her today.  Patient's abdominal bloating seems somewhat improved.  Has used omeprazole as needed with good effect & probiotic daily with good effect.  Has days where she feels more constipated and like stools are harder.  She would like something as needed to help her have a bowel movement if she is having constipation.  She would prefer not to have to take something every day.  Denies vomiting or diarrhea.  Patient also has history of uterine fibroid, has upcoming appointment with GYN to discuss options.  States she was told in the past she might need a partial or total hysterectomy due to size of fibroid.  Her anxiety is improved since taking the medications to help reduce her bloating.  States she believes they anxiety related to the bloating, because it became so painful at times felt like she could not breathe.   Patient Active Problem List   Diagnosis Date Noted  . Hives 07/23/2016  . Right knee  pain 05/18/2016  . Morbid obesity with BMI of 40.0-44.9, adult ([place]) 04/23/2016  . HTN (hypertension) 04/23/2016  . Encounter for preventative adult health care exam with abnormal findings 04/23/2016  . 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 Smoking status: Current Every Day Smoker    Packs/day: 0.50    Years: 22.00    Pack years: 11.00  . Smokeless tobacco: Never Used  Substance Use Topics  . Alcohol use: No   Review of Systems  Constitutional: Negative for chills, fatigue and fever.  HENT: Negative for congestion, ear pain, sinus pain and sore throat.   Eyes: Negative.   Respiratory: Negative for cough, shortness of breath and wheezing.   Cardiovascular: Negative for chest pain, palpitations and leg swelling.  Gastrointestinal: Negative for abdominal pain, diarrhea, nausea and vomiting. Some bloating, better. Some constipation.  Genitourinary: Negative for dysuria, frequency and urgency.  Musculoskeletal: Negative for arthralgias and myalgias.  Skin: Negative for color change, pallor and rash.  Neurological: Negative for syncope, light-headedness and headaches.  Psychiatric/Behavioral: The patient is not nervous/anxious.       Objective:   Physical Exam  Constitutional: She appears well-developed and well-nourished. No distress.  HENT:  Head: Normocephalic and atraumatic.  Eyes: Pupils are equal, round, and reactive to light. EOM are normal. No scleral icterus.  Neck: Normal range of motion. Neck supple. No tracheal deviation present.  Cardiovascular: Normal rate, regular rhythm  and normal heart sounds.  Pulmonary/Chest: Effort normal and breath sounds normal. No respiratory distress. She has no wheezes. She has no rales.  Abdominal: Soft. Bowel sounds are normal. There is no tenderness.  Neurological: She is alert and oriented to person, place, and time. Gait normal  Skin: Skin  is warm and dry. No pallor.  Psychiatric: She has a normal mood and affect. Her behavior is normal.   Nursing note and vitals reviewed.   BP Readings from Last 3 Encounters:  09/26/18 (!) 180/100  09/12/18 (!) 182/102  08/29/18 (!) 164/94   Today's Vitals   09/26/18 0900 09/26/18 0930  BP: (!) 180/100 (!) 168/96  Pulse: 84   Resp: 18   Temp: 98.3 F (36.8 C)   TempSrc: Oral   SpO2: 96%   Weight: 244 lb 12.8 oz (111 kg)   Height: 5\' 3"  (1.6 m)    Body mass index is 43.36 kg/m.     Assessment & Plan:    A total of 40  minutes were spent face-to-face with the patient during this encounter and over half of that time was spent on counseling and coordination of care. The patient was counseled on importance of controlling blood pressure, healthy diet and regular exercise, possible causes of constipation/bloating symptoms other than excess acid or constipation-could be related to fibroid.  Essential hypertension-medication changes made by cardiology.  So far the changes have been going well per patient.  She did not take her morning dose of blood pressure medication before coming to clinic today.  Patient was able to show me reading of her blood pressure from at home cough yesterday and it was 149/82.  She will keep cardiology appointments as planned and all further testing ordered by cardiology.  Vitamin D deficiency-she will take 1000 units of vitamin D per day to help keep vitamin D levels in appropriate range.  Constipation/bloating- patient prescribed bicycle to use as needed for moderate constipation.  Encouraged to keep up good fiber intake in her diet as well as drinking plenty of water per day.  She will use omeprazole as needed if having bloating.  Suspect the constipation and bloating and abdominal cramping symptoms could all be partially related to uterine fibroid as well.  She will keep dry weight and appointment as planned.  Anxiety-anxiety is improved now that she has a  plan in place to take medications as needed if bloating becomes painful.  Patient will follow-up here in approximately 3 months for recheck on chronic medical additions.  She will return to clinic sooner if any issues arise.

## 2018-09-26 NOTE — Patient Instructions (Signed)
Obtain Sleep study ASAP  STOP SMOKING!!!

## 2018-10-03 ENCOUNTER — Ambulatory Visit: Payer: BLUE CROSS/BLUE SHIELD | Admitting: Obstetrics and Gynecology

## 2018-10-03 IMAGING — XA IR CAROTID INTERNAL HEAD/NECK BILAT  (MS)
7 series · 13 of 24 positions shown · IV contrast (IODINE)
Comparison: none

PROCEDURE:
DIAGNOSTIC CEREBRAL ANGIOGRAM
HISTORY: The patient is a 38 y.o. initially presenting with right third nerve
palsy. She previously underwent craniotomy for clipping of the
aneurysm about 1 year ago. She presents today for routine follow-up
diagnostic cerebral angiogram.
TECHNIQUE: CATHETERS AND WIRES
5-French JB-1 glidecatheter  0.035" glidewire

[Series 1: carotid care 2 · 2 acquisitions, 2 frames shown (1 of 5)]
[im 1/2  full-range]
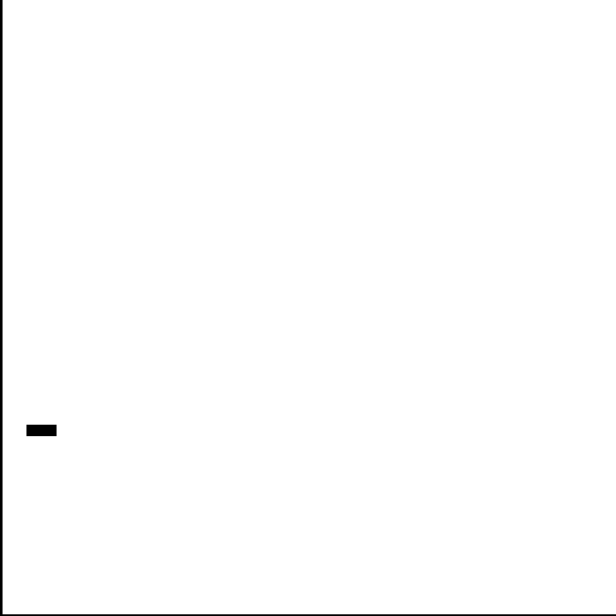
[im 2/2  full-range]
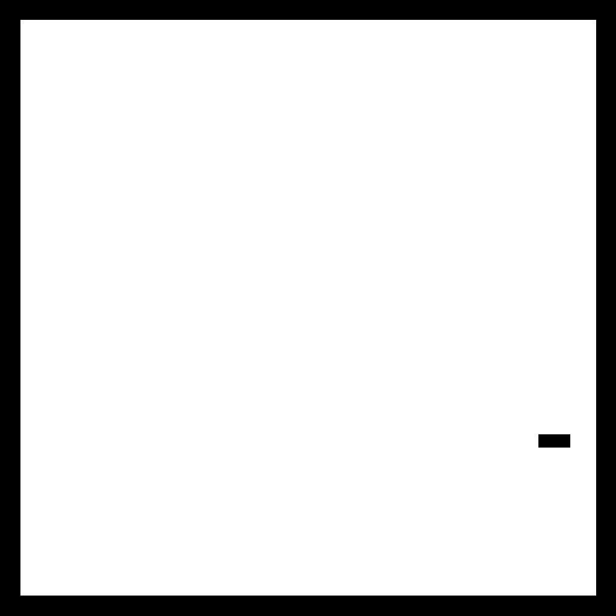

[Series 2: carotid care 2 · 2 acquisitions, 2 frames shown (2 of 5)]
[im 1/2]
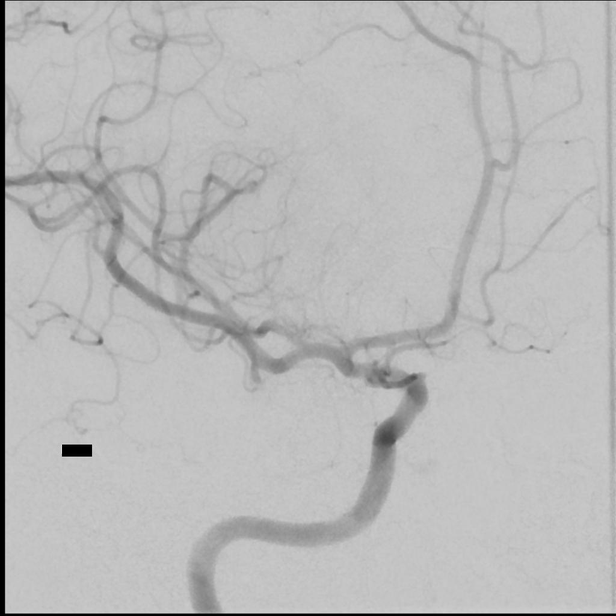
[im 2/2]
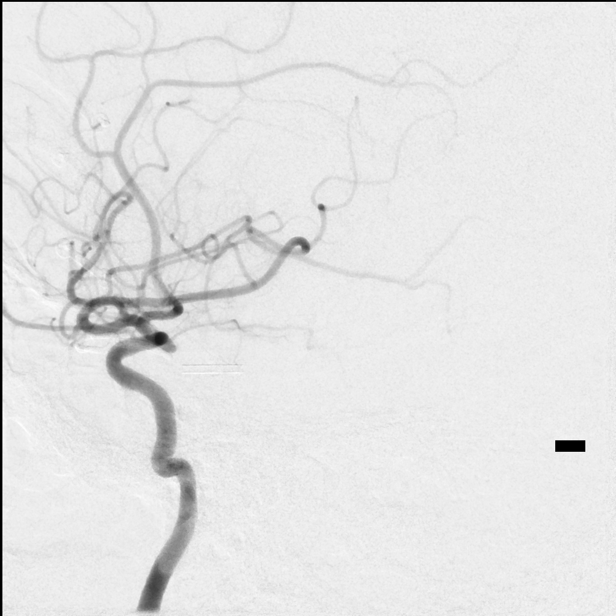

[Series 3: carotid care 2 · 2 acquisitions, 2 frames shown (3 of 5)]
[im 1/2]
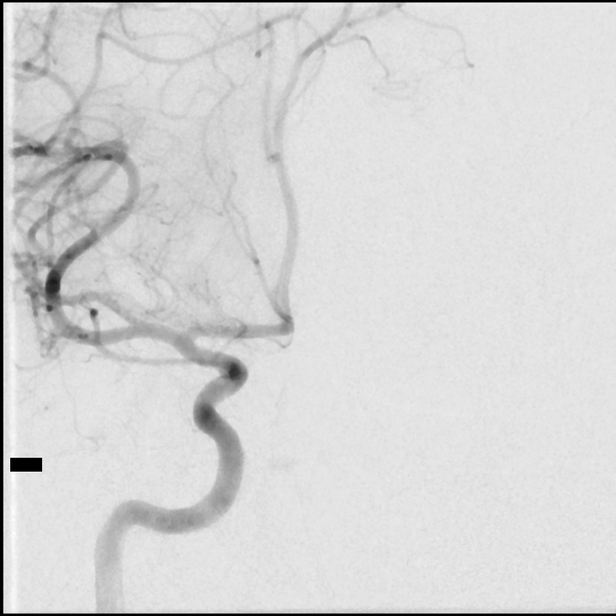
[im 2/2]
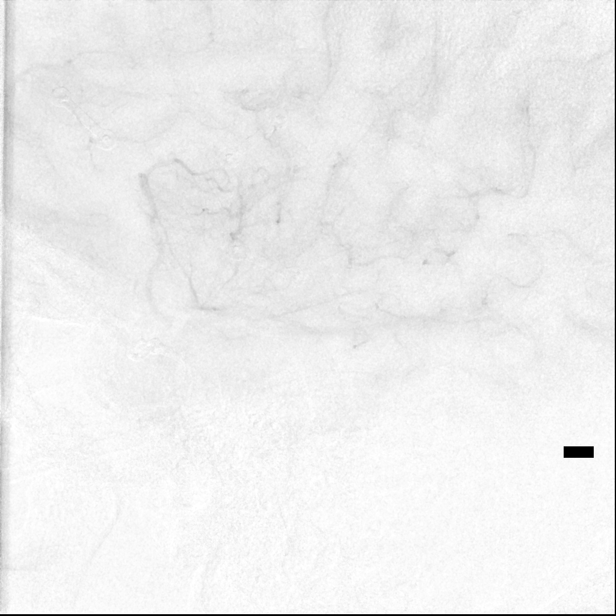

[Series 4: carotid care 2 · 2 acquisitions, 2 frames shown (4 of 5)]
[im 1/2]
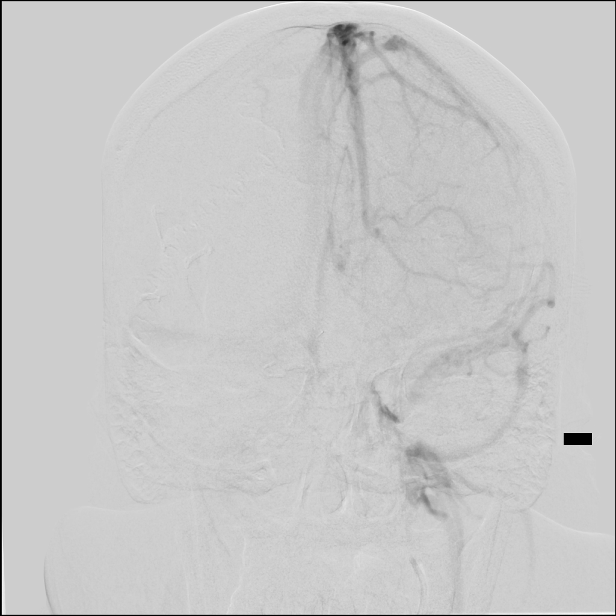
[im 2/2]
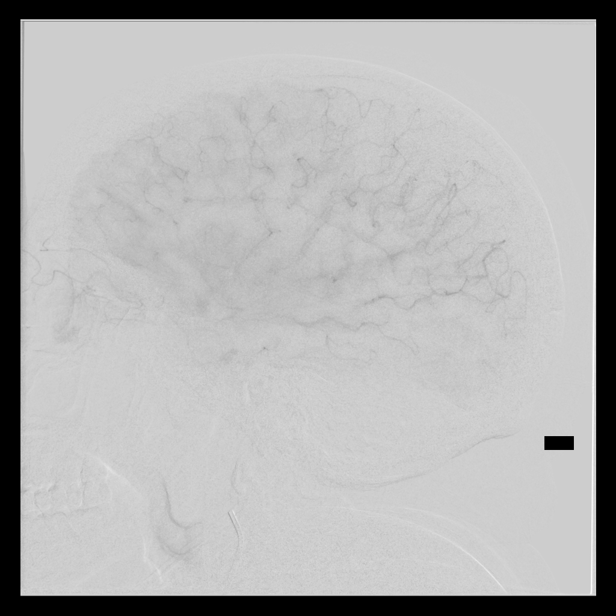

[Series 5: carotid care 2 · 2 acquisitions, 1 frame shown (5 of 5)]
[im 1/2]
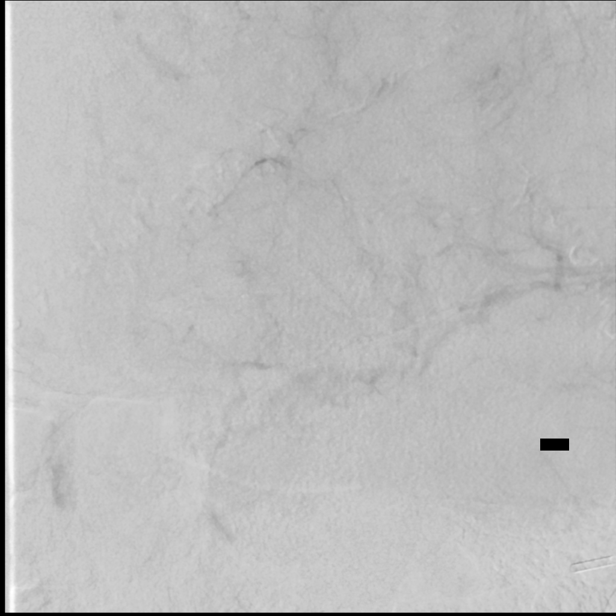

[Series 6: fl neuro n · 1 of 39 frames shown]
[frame 1/39]
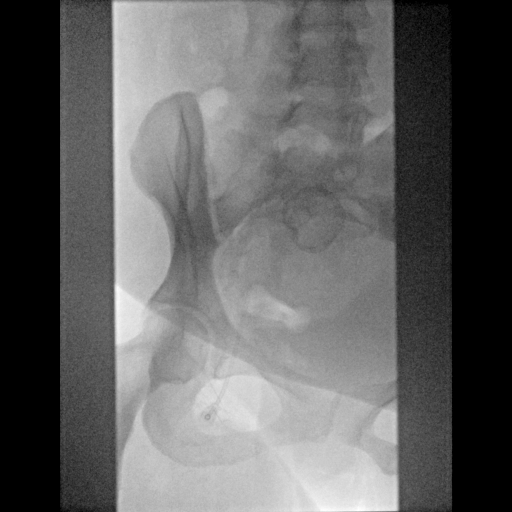

[Series 300: dr. (person_name) · 3 of 10 slices shown]
[im 1/10]
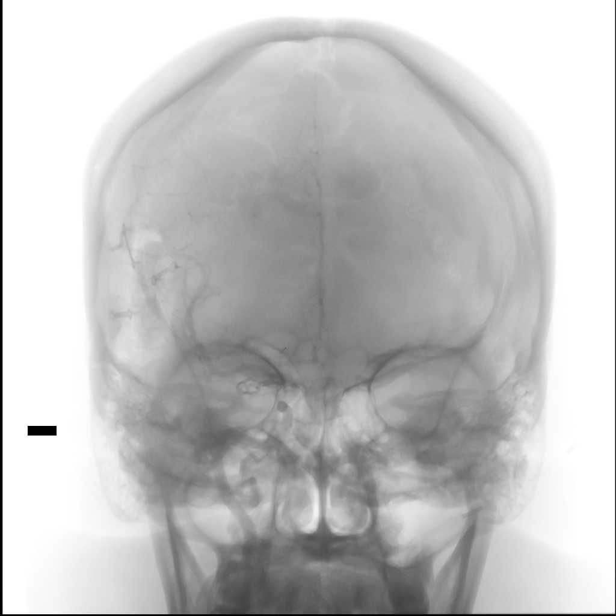
[im 6/10]
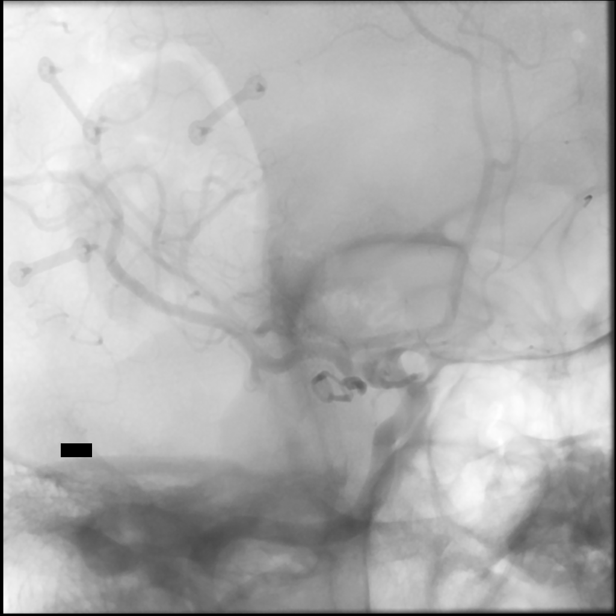
[im 10/10]
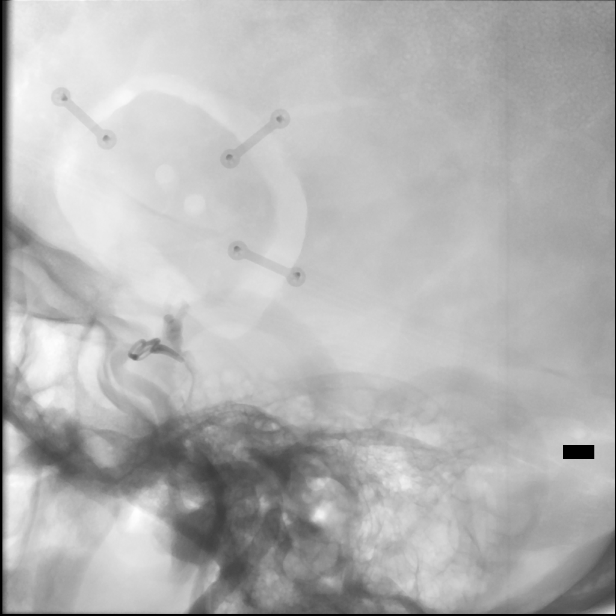

[13 of 24 positions shown; findings below may reference images not displayed]

ACCESS:
The technical aspects of the procedure as well as its potential
risks and benefits were reviewed with the patient. These risks
included but were not limited bleeding, infection, allergic
reaction, damage to organs/vital structures, stroke, non-diagnostic
procedure, and the catastrophic outcomes of heart attack, coma, and
death. With an understanding of these risks, informed consent was
obtained and witnessed. The patient was placed in the supine
position on the angiography table and the skin of right groin
prepped in the usual sterile fashion. The procedure was performed
under local anesthesia (1%-solution of bicarbonate-bufferred
Lidocaine) and conscious sedation with Versed and fentanyl monitored
by the in-suite nurse. A 5- French sheath was introduced in the
right common femoral artery using Seldinger technique. A fluorophase
sequence was used to document the sheath position.

MEDICATIONS:
HEPARIN: 0222 Units total.

CONTRAST:  cc, Omnipaque 300

FLUOROSCOPY TIME:  FLUOROSCOPY TIME:  See IR records
VESSELS CATHETERIZED
Right internal carotid  Left internal carotid   Right common femoral

VESSELS STUDIED
Right internal carotid, headLeft internal carotid, headRight femoral

PROCEDURAL NARRATIVE
A 5-Fr JB-1 terumo glide catheter was advanced over a
glidewire into the aortic arch. The above vessels were then
sequentially catheterized and cervical/cerebral angiograms taken.
After review of images, the catheter was removed without incident.
FINDINGS: Right internal carotid: head:

Injection reveals the presence of a widely patent ICA, M1, and A1
segments and their branches. Surgical clip is seen adjacent to the
supraclinoid internal carotid artery. The previously described
posterior communicating artery aneurysm is no longer visualized. No
aneurysm recurrence is seen. There is no stenosis of the internal
carotid artery. The parenchymal and venous phases are normal. The
venous sinuses are widely patent.

Left internal carotid: head: Injection reveals the presence of a
widely patent ICA, A1, and M1 segments and their branches. There is
no significant stenosis, occlusion, aneurysm, or high flow vascular
malformation visualized. The parenchymal and venous phases are
normal. The venous sinuses are widely patent.

Right femoral: Normal vessel. No significant atherosclerotic
disease. Arterial sheath in adequate position.

DISPOSITION:
Upon completion of the study, the femoral sheath was removed and
hemostasis obtained using a 5-Fr ExoSeal closure device. Good
proximal and distal lower extremity pulses were documented upon
achievement of hemostasis. The procedure was well tolerated and no
early complications were observed. The patient was transferred back
to the holding area to be positioned flat in bed for 3 hours of
observation.
IMPRESSION: 1. Complete occlusion of a right posterior communicating artery
aneurysm, 1 year after surgical clipping.

The preliminary results of this procedure were shared with the
patient and the patient's family.

## 2018-10-06 ENCOUNTER — Other Ambulatory Visit: Payer: Self-pay

## 2018-10-06 ENCOUNTER — Emergency Department: Payer: BLUE CROSS/BLUE SHIELD

## 2018-10-06 ENCOUNTER — Emergency Department
Admission: EM | Admit: 2018-10-06 | Discharge: 2018-10-06 | Disposition: A | Discharge status: Home / Self Care | Payer: BLUE CROSS/BLUE SHIELD | Attending: Student in an Organized Health Care Education/Training Program | Admitting: Student in an Organized Health Care Education/Training Program

## 2018-10-06 ENCOUNTER — Encounter: Payer: Self-pay | Admitting: Emergency Medicine

## 2018-10-06 ENCOUNTER — Ambulatory Visit: Payer: Self-pay

## 2018-10-06 DIAGNOSIS — R079 Chest pain, unspecified: Secondary | ICD-10-CM | POA: Diagnosis not present

## 2018-10-06 DIAGNOSIS — J40 Bronchitis, not specified as acute or chronic: Secondary | ICD-10-CM

## 2018-10-06 DIAGNOSIS — I10 Essential (primary) hypertension: Secondary | ICD-10-CM | POA: Insufficient documentation

## 2018-10-06 DIAGNOSIS — R0602 Shortness of breath: Secondary | ICD-10-CM | POA: Diagnosis not present

## 2018-10-06 DIAGNOSIS — Z79899 Other long term (current) drug therapy: Secondary | ICD-10-CM | POA: Insufficient documentation

## 2018-10-06 DIAGNOSIS — F17210 Nicotine dependence, cigarettes, uncomplicated: Secondary | ICD-10-CM | POA: Insufficient documentation

## 2018-10-06 LAB — POCT PREGNANCY, URINE: Preg Test, Ur: NEGATIVE

## 2018-10-06 LAB — BASIC METABOLIC PANEL
Anion gap: 9 (ref 5–15)
BUN: 16 mg/dL (ref 6–20)
CALCIUM: 9.3 mg/dL (ref 8.9–10.3)
CO2: 30 mmol/L (ref 22–32)
Chloride: 101 mmol/L (ref 98–111)
Creatinine, Ser: 0.98 mg/dL (ref 0.44–1.00)
GFR calc Af Amer: >60 mL/min (ref >60)
GFR calc non Af Amer: >60 mL/min (ref >60)
GLUCOSE: 111 mg/dL — AB (ref 70–99)
Potassium: 3 mmol/L — ABNORMAL LOW (ref 3.5–5.1)
Sodium: 140 mmol/L (ref 135–145)

## 2018-10-06 LAB — CBC
HCT: 42 % (ref 36.0–46.0)
Hemoglobin: 13.7 g/dL (ref 12.0–15.0)
MCH: 26.8 pg (ref 26.0–34.0)
MCHC: 32.6 g/dL (ref 30.0–36.0)
MCV: 82 fL (ref 80.0–100.0)
Platelets: 230 10*3/uL (ref 150–400)
RBC: 5.12 MIL/uL — AB (ref 3.87–5.11)
RDW: 14.2 % (ref 11.5–15.5)
WBC: 5.8 10*3/uL (ref 4.0–10.5)
nRBC: 0 % (ref 0.0–0.2)

## 2018-10-06 LAB — TROPONIN I: Troponin I: <0.03 ng/mL (ref <0.03)

## 2018-10-06 MED ORDER — IPRATROPIUM-ALBUTEROL 0.5-2.5 (3) MG/3ML IN SOLN
3.0000 mL | Freq: Once | RESPIRATORY_TRACT | Status: AC
  Administered 2018-10-06: 3 mL via RESPIRATORY_TRACT
  Filled 2018-10-06: qty 3

## 2018-10-06 MED ORDER — ALBUTEROL SULFATE HFA 108 (90 BASE) MCG/ACT IN AERS: 2.0000 | INHALATION_SPRAY | Freq: Four times a day (QID) | RESPIRATORY_TRACT | 2 refills | Status: DC | PRN

## 2018-10-06 MED ORDER — SODIUM CHLORIDE 0.9% FLUSH: 3.0000 mL | Freq: Once | INTRAVENOUS | Status: DC

## 2018-10-06 MED ORDER — AZITHROMYCIN 500 MG PO TABS: 500.0000 mg | ORAL_TABLET | Freq: Every day | ORAL | 0 refills | Status: AC

## 2018-10-06 MED ORDER — PREDNISONE 20 MG PO TABS: 40.0000 mg | ORAL_TABLET | Freq: Every day | ORAL | 0 refills | Status: AC

## 2018-10-06 NOTE — Telephone Encounter (Signed)
Called patient back to confirm that she was going to the ED as recommended by [facility] Triage RN.  Patient stated that she had left work and that her boyfriend was driving her to the ED.  Patient said that she was about 5 to 10 minutes away from [facility].

## 2018-10-06 NOTE — Telephone Encounter (Signed)
Pt c/o pain on inhalation  since yesterday. Pt stated she had moderate pain to  her upper abdomen and upper chest and through to the center of her back. Pt stated that she is not SOB but is concerned she has an URI. Pt with no respiratory symptoms. Pt stated that pain in inhalation is moderate. Pt c/o lightheadedness. Pt has h/o HTN , smokes and is overweight. Pt advised to go to the ED for evaluation. Pt stated her boyfriend will take her to [facility] ED.  Reason for Disposition . Taking a deep breath makes pain worse  Answer Assessment - Initial Assessment Questions 1. LOCATION: "Where does it hurt?"       Top and center of chest when takes a deep breath 2. RADIATION: "Does the pain go anywhere else?" (e.g., into neck, jaw, arms, back)    Goes through to back 3. ONSET: "When did the chest pain begin?" (Minutes, hours or days)      yesterday 4. PATTERN "Does the pain come and go, or has it been constant since it started?"  "Does it get worse with exertion?"      Constant with takes a deep breath- upper part of abdomen, both breaths and through to the center of back 5. DURATION: "How long does it last" (e.g., seconds, minutes, hours)     Lasts with inhalation and does not hurt with exhalation 6. SEVERITY: "How bad is the pain?"  (e.g., Scale 1-10; mild, moderate, or severe)    - MILD (1-3): doesn't interfere with normal activities     - MODERATE (4-7): interferes with normal activities or awakens from sleep    - SEVERE (8-10): excruciating pain, unable to do any normal activities       moderate 7. CARDIAC RISK FACTORS: "Do you have any history of heart problems or risk factors for heart disease?" (e.g., prior heart attack, angina; high blood pressure, diabetes, being overweight, high cholesterol, smoking, or strong family history of heart disease)     HTN, smoke, overweight 8. PULMONARY RISK FACTORS: "Do you have any history of lung disease?"  (e.g., blood clots in lung, asthma, emphysema, birth  control pills)     no 9. CAUSE: "What do you think is causing the chest pain?"     Pt doesn't know 10. OTHER SYMPTOMS: "Do you have any other symptoms?" (e.g., dizziness, nausea, vomiting, sweating, fever, difficulty breathing, cough)       Lightheaded 11. PREGNANCY: "Is there any chance you are pregnant?" "When was your last menstrual period?"       LMP: February twice  Protocols used: CHEST PAIN-A-AH

## 2018-10-06 NOTE — ED Notes (Signed)
NAD noted at time of D/C. Pt denies questions or concerns. Pt ambulatory to the lobby at this time.  

## 2018-10-06 NOTE — ED Provider Notes (Signed)
[facility] Emergency Department Provider Note    First MD Initiated Contact with Patient 10/06/18 1851     (approximate)  I have reviewed the triage vital signs and the nursing notes.   HISTORY  Chief Complaint Chest Pain    HPI Bianca Acosta is a 44 y.o. female low listed past medical history with long history of smoking presents to the ER for 24 hours of chest discomfort and pressure associated with some shortness of breath and discomfort when she takes a deep inspiration.  Denies any history of blood clot.  No recent travel.  No fevers.  Does have occasional nonproductive cough.  Is worried that this may be bronchitis.  Is also worried that her blood pressure after having many blood pressure medications changed over the past week or 2 is too low.  She denies any chest pain at rest.  No diaphoresis.  Is having some intermittent epigastric discomfort and bloating.    Past Medical History:  Diagnosis Date  . Brain aneurysm 06/29/2015  . Chicken pox   . Headache   . Hypertension    Family History  Problem Relation Age of Onset  . Hypertension Mother   . Hypertension Father   . Hypertension Maternal Aunt   . Hypertension Maternal Uncle   . Hypertension Paternal Aunt   . Hypertension Paternal Uncle   . Hypertension Maternal Grandmother   . Hypertension Maternal Grandfather   . Hypertension Paternal Grandmother   . Hypertension Paternal Grandfather    Past Surgical History:  Procedure Laterality Date  . CRANIOTOMY Right 06/29/2015   Procedure: CRANIOTOMY INTRACRANIAL ANEURYSM CLIPPING;  Surgeon: Kevan Ny Ditty, MD;  Location: [place] NEURO ORS;  Service: Neurosurgery;  Laterality: Right;  . IR GENERIC HISTORICAL  05/25/2016   IR ANGIO INTRA EXTRACRAN SEL INTERNAL CAROTID BILAT MOD SED 05/25/2016 Consuella Lose, MD [facility]  . IR GENERIC HISTORICAL  06/29/2015   IR ANGIO INTRA EXTRACRAN SEL INTERNAL CAROTID BILAT MOD SED 06/29/2015  Consuella Lose, MD [facility]  . IR GENERIC HISTORICAL  06/29/2015   IR ANGIO VERTEBRAL SEL VERTEBRAL UNI L MOD SED 06/29/2015 Consuella Lose, MD [facility]  . IR GENERIC HISTORICAL  06/29/2015   IR 3D INDEPENDENT WKST 06/29/2015 Consuella Lose, MD [facility]   Patient Active Problem List   Diagnosis Date Noted  . Hives 07/23/2016  . Right knee pain 05/18/2016  . Morbid obesity with BMI of 40.0-44.9, adult ([place]) 04/23/2016  . HTN (hypertension) 04/23/2016  . Encounter for preventative adult health care exam with abnormal findings 04/23/2016  . 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 Authorizing Provider  albuterol (PROVENTIL HFA;VENTOLIN HFA) 108 (90 Base) MCG/ACT inhaler Inhale 2 puffs into the lungs every 6 (six) hours as needed for wheezing or shortness of breath. 08/29/18   Guse, Jacquelynn Cree, FNP  albuterol (PROVENTIL HFA;VENTOLIN HFA) 108 (90 Base) MCG/ACT inhaler Inhale 2 puffs into the lungs every 6 (six) hours as needed for wheezing or shortness of breath. 10/06/18   Merlyn Lot, MD  azithromycin (ZITHROMAX) 500 MG tablet Take 1 tablet (500 mg total) by mouth daily for 3 days. Take 1 tablet daily for 3 days. 10/06/18 10/09/18  Merlyn Lot, MD  bisacodyl (DULCOLAX) 5 MG EC tablet Take 1 tablet (5 mg total) by mouth daily as needed for moderate constipation. 09/26/18  Jodelle Green, FNP  chlorthalidone (HYGROTON) 25 MG tablet Take 1 tablet (25 mg total) by mouth daily. 09/12/18 12/11/18  Wellington Hampshire, MD  cholecalciferol (VITAMIN D3) 25 MCG (1000 UT) tablet Take 1 tablet (1,000 Units total) by mouth daily. 09/26/18   Guse, Jacquelynn Cree, FNP  nebivolol (BYSTOLIC) 10 MG tablet Take 1 tablet (10 mg total) by mouth daily. 09/26/18   Jodelle Green, FNP  omeprazole (PRILOSEC) 20 MG capsule Take 1 capsule (20 mg total) by mouth  daily. 09/17/18   Jodelle Green, FNP  potassium chloride SA (K-DUR,KLOR-CON) 20 MEQ tablet Take 1 tablet (20 mEq total) by mouth daily. 09/23/18   Wellington Hampshire, MD  predniSONE (DELTASONE) 20 MG tablet Take 2 tablets (40 mg total) by mouth daily for 5 days. 10/06/18 10/11/18  Merlyn Lot, MD  Probiotic Product (FORTIFY PROBIOTIC WOMENS EX ST) CPDR Take 1 capsule by mouth daily. 09/17/18   Jodelle Green, FNP  spironolactone (ALDACTONE) 25 MG tablet Take 1 tablet (25 mg total) by mouth daily. 09/12/18 12/11/18  Wellington Hampshire, MD    Allergies Augmentin [amoxicillin-pot clavulanate] and Ciprofloxacin    Social History Social History   Tobacco Use  . Smoking status: Current Every Day Smoker    Packs/day: 0.50    Years: 22.00    Pack years: 11.00  . Smokeless tobacco: Never Used  . Tobacco comment: 1 pack lasts a day or two  Substance Use Topics  . Alcohol use: No  . Drug use: No    Review of Systems Patient denies headaches, rhinorrhea, blurry vision, numbness, shortness of breath, chest pain, edema, cough, abdominal pain, nausea, vomiting, diarrhea, dysuria, fevers, rashes or hallucinations unless otherwise stated above in HPI. ____________________________________________   PHYSICAL EXAM:  VITAL SIGNS: Vitals:   10/06/18 1600 10/06/18 1949  BP: (!) 163/76 (!) 155/101  Pulse: 75 69  Resp: 18 18  Temp: 98.7 F (37.1 C)   SpO2: 100% 100%    Constitutional: Alert and oriented.  Eyes: Conjunctivae are normal.  Head: Atraumatic. Nose: No congestion/rhinnorhea. Mouth/Throat: Mucous membranes are moist.   Neck: No stridor. Painless ROM.  Cardiovascular: Normal rate, regular rhythm. Grossly normal heart sounds.  Good peripheral circulation. Respiratory: Normal respiratory effort.  No retractions. Lungs with coarse bibasilar breathsounds Gastrointestinal: Soft and nontender. No distention. No abdominal bruits. No CVA tenderness. Genitourinary:  Musculoskeletal: No  lower extremity tenderness nor edema.  No joint effusions. Neurologic:  Normal speech and language. No gross focal neurologic deficits are appreciated. No facial droop Skin:  Skin is warm, dry and intact. No rash noted. Psychiatric: Mood and affect are normal. Speech and behavior are normal.  ____________________________________________   LABS (all labs ordered are listed, but only abnormal results are displayed)  Results for orders placed or performed during the hospital encounter of 10/06/18 (from the past 24 hour(s))  Basic metabolic panel     Status: Abnormal   Collection Time: 10/06/18  4:01 PM  Result Value Ref Range   Sodium 140 135 - 145 mmol/L   Potassium 3.0 (L) 3.5 - 5.1 mmol/L   Chloride 101 98 - 111 mmol/L   CO2 30 22 - 32 mmol/L   Glucose, Bld 111 (H) 70 - 99 mg/dL   BUN 16 6 - 20 mg/dL   Creatinine, Ser 0.98 0.44 - 1.00 mg/dL   Calcium 9.3 8.9 - 10.3 mg/dL   GFR calc non Af Amer >60 >60 mL/min   GFR calc Af Amer >  60 >60 mL/min   Anion gap 9 5 - 15  CBC     Status: Abnormal   Collection Time: 10/06/18  4:01 PM  Result Value Ref Range   WBC 5.8 4.0 - 10.5 K/uL   RBC 5.12 (H) 3.87 - 5.11 MIL/uL   Hemoglobin 13.7 12.0 - 15.0 g/dL   HCT 42.0 36.0 - 46.0 %   MCV 82.0 80.0 - 100.0 fL   MCH 26.8 26.0 - 34.0 pg   MCHC 32.6 30.0 - 36.0 g/dL   RDW 14.2 11.5 - 15.5 %   Platelets 230 150 - 400 K/uL   nRBC 0.0 0.0 - 0.2 %  Troponin I - ONCE - STAT     Status: None   Collection Time: 10/06/18  4:01 PM  Result Value Ref Range   Troponin I <0.03 <0.03 ng/mL  Pregnancy, urine POC     Status: None   Collection Time: 10/06/18  4:12 PM  Result Value Ref Range   Preg Test, Ur NEGATIVE NEGATIVE   ____________________________________________  EKG My review and personal interpretation at Time: 15:57   Indication: htn  Rate: 60  Rhythm: sinus Axis: normal Other: nonsepcific st and t wave abn in inferolateral leads, uncahnged from previous  09/12/2018 ____________________________________________  RADIOLOGY  I personally reviewed all radiographic images ordered to evaluate for the above acute complaints and reviewed radiology reports and findings.  These findings were personally discussed with the patient.  Please see medical record for radiology report.  ____________________________________________   PROCEDURES  Procedure(s) performed:  Procedures    Critical Care performed: no ____________________________________________   INITIAL IMPRESSION / ASSESSMENT AND PLAN / ED COURSE  Pertinent labs & imaging results that were available during my care of the patient were reviewed by me and considered in my medical decision making (see chart for details).   DDX: Bronchitis, pneumonia, pneumothorax, anemia, electrolyte abnormality, ACS, PE  Toneshia N Dominey is a 43 y.o. who presents to the ED with symptoms as described above.  Patient nontoxic-appearing abdominal exam soft and benign.  Does have some coarse breath sounds and is heavy smoker.  Have high suspicion for bronchitis.  Chest x-ray without any focal abnormality.  She is low risk by Wells criteria and is PERC negative.  EKG shows nonspecific abnormalities that are consistent with previous EKG.  Her troponin is negative after over 24 hours of symptoms.  Does not seem clinically consistent with ACS.  Will give nebulizer treatment and reassess.  Clinical Course as of Oct 06 2213  Mon Oct 06, 2018  1940 Assessed after nebulizer treatment.  Does feel improvement in her breathing.  On exam I do hear some notable wheeze with improvement in aeration.  Have a high suspicion for bronchitis.  Does not seem clinically consistent with ACS at the pain is pain-free with negative enzyme for over 24 hours of symptoms.  Abdominal exam soft and benign.  Given duration of symptoms do not feel that repeat troponin clinically indicated particular given improvement in symptoms after nebulizer  treatment.  I do believe she stable and appropriate for outpatient follow-up.   [PR]    Clinical Course User Index [PR] Merlyn Lot, MD     As part of my medical decision making, I reviewed the following data within the [place] notes reviewed and incorporated, Labs reviewed, notes from prior ED visits and [place] Controlled Substance Database   ____________________________________________   FINAL CLINICAL IMPRESSION(S) / ED DIAGNOSES  Final diagnoses:  Shortness of breath  Bronchitis      NEW MEDICATIONS STARTED DURING THIS VISIT:  Discharge Medication List as of 10/06/2018  7:45 PM    START taking these medications   Details  !! albuterol (PROVENTIL HFA;VENTOLIN HFA) 108 (90 Base) MCG/ACT inhaler Inhale 2 puffs into the lungs every 6 (six) hours as needed for wheezing or shortness of breath., Starting Mon 10/06/2018, Normal    azithromycin (ZITHROMAX) 500 MG tablet Take 1 tablet (500 mg total) by mouth daily for 3 days. Take 1 tablet daily for 3 days., Starting Mon 10/06/2018, Until Thu 10/09/2018, Normal    predniSONE (DELTASONE) 20 MG tablet Take 2 tablets (40 mg total) by mouth daily for 5 days., Starting Mon 10/06/2018, Until Sat 10/11/2018, Normal     !! - Potential duplicate medications found. Please discuss with provider.       Note:  This document was prepared using Dragon voice recognition software and may include unintentional dictation errors.    Merlyn Lot, MD 10/06/18 2215

## 2018-10-06 NOTE — Telephone Encounter (Signed)
Yes ER is correct location for eval of chest pain

## 2018-10-06 NOTE — ED Triage Notes (Signed)
Pt presents to ED with c/o hypotension, states at home BP 101/65 "which is low for someone with high blood pressure". Pt also c/o chest pain that is worse with deep inspiration. Pt states pain 4/10 under L breast and substernal that radiates to back.

## 2018-10-06 NOTE — Discharge Instructions (Addendum)
Please follow-up with Dr. Fletcher Anon in cardiology clinic as soon as possible.  Return to the ER for any worsening symptoms questions or concerns.

## 2018-10-10 ENCOUNTER — Ambulatory Visit (INDEPENDENT_AMBULATORY_CARE_PROVIDER_SITE_OTHER): Payer: BLUE CROSS/BLUE SHIELD | Admitting: Obstetrics and Gynecology

## 2018-10-10 ENCOUNTER — Encounter: Payer: Self-pay | Admitting: Obstetrics and Gynecology

## 2018-10-10 VITALS — BP 142/70 | HR 70 | Wt 250.0 lb

## 2018-10-10 DIAGNOSIS — G4733 Obstructive sleep apnea (adult) (pediatric): Secondary | ICD-10-CM | POA: Diagnosis not present

## 2018-10-10 DIAGNOSIS — D251 Intramural leiomyoma of uterus: Secondary | ICD-10-CM | POA: Diagnosis not present

## 2018-10-10 DIAGNOSIS — D252 Subserosal leiomyoma of uterus: Secondary | ICD-10-CM

## 2018-10-10 DIAGNOSIS — N939 Abnormal uterine and vaginal bleeding, unspecified: Secondary | ICD-10-CM | POA: Diagnosis not present

## 2018-10-10 DIAGNOSIS — Z3169 Encounter for other general counseling and advice on procreation: Secondary | ICD-10-CM

## 2018-10-10 NOTE — Progress Notes (Signed)
Obstetrics & Gynecology Office Visit   Chief Complaint:  Chief Complaint  Patient presents with  . Pelvic Pain    irregular cycles/discuss surgery    History of Present Illness: 43 year old G4P0040 presenting for follow up of uterine fibroids.  She has noted her menstrual cycles have gotten heavier in flow, and she has failed to conceive in the past year.  Still no intermenstrual spotting.  Moderate dysmenorrhea.  Last cycle came a week early.  She is interested in further pursuing possible surgical option for the management of her fibroids.    Pap 01/16/2018 ASCUS HPV negative  Review of Systems: Review of Systems  Constitutional: Negative.   Gastrointestinal: Negative.   Skin: Negative.      Past Medical History:  Past Medical History:  Diagnosis Date  . Brain aneurysm 06/29/2015  . Chicken pox   . Headache   . Hypertension     Past Surgical History:  Past Surgical History:  Procedure Laterality Date  . CRANIOTOMY Right 06/29/2015   Procedure: CRANIOTOMY INTRACRANIAL ANEURYSM CLIPPING;  Surgeon: Kevan Ny Ditty, MD;  Location: [place] NEURO ORS;  Service: Neurosurgery;  Laterality: Right;  . IR GENERIC HISTORICAL  05/25/2016   IR ANGIO INTRA EXTRACRAN SEL INTERNAL CAROTID BILAT MOD SED 05/25/2016 Consuella Lose, MD [facility]  . IR GENERIC HISTORICAL  06/29/2015   IR ANGIO INTRA EXTRACRAN SEL INTERNAL CAROTID BILAT MOD SED 06/29/2015 Consuella Lose, MD [facility]  . IR GENERIC HISTORICAL  06/29/2015   IR ANGIO VERTEBRAL SEL VERTEBRAL UNI L MOD SED 06/29/2015 Consuella Lose, MD [facility]  . IR GENERIC HISTORICAL  06/29/2015   IR 3D INDEPENDENT WKST 06/29/2015 Consuella Lose, MD [facility]    Gynecologic History: Patient's last menstrual period was 09/26/2018.  Obstetric History: G4P0000  Family History:  Family History  Problem Relation Age of Onset  . Hypertension Mother   . Hypertension Father   . Hypertension Maternal Aunt   .  Hypertension Maternal Uncle   . Hypertension Paternal Aunt   . Hypertension Paternal Uncle   . Hypertension Maternal Grandmother   . Hypertension Maternal Grandfather   . Hypertension Paternal Grandmother   . Hypertension Paternal Grandfather     Social History:  Social History   Socioeconomic History  . Marital status: Single    Spouse name: Not on file  . Number of children: Not on file  . Years of education: Not on file  . Highest education level: Not on file  Occupational History  . Not on file  Social Needs  . Financial resource strain: Not on file  . Food insecurity:    Worry: Not on file    Inability: Not on file  . Transportation needs:    Medical: Not on file    Non-medical: Not on file  Tobacco Use  . Smoking status: Current Every Day Smoker    Packs/day: 0.50    Years: 22.00    Pack years: 11.00  . Smokeless tobacco: Never Used  . Tobacco comment: 1 pack lasts a day or two  Substance and Sexual Activity  . Alcohol use: No  . Drug use: No  . Sexual activity: Yes    Birth control/protection: None  Lifestyle  . Physical activity:    Days per week: Not on file    Minutes per session: Not on file  . Stress: Not on file  Relationships  . Social connections:    Talks on phone: Not on file  Gets together: Not on file    Attends religious service: Not on file    Active member of club or organization: Not on file    Attends meetings of clubs or organizations: Not on file    Relationship status: Not on file  . Intimate partner violence:    Fear of current or ex partner: Not on file    Emotionally abused: Not on file    Physically abused: Not on file    Forced sexual activity: Not on file  Other Topics Concern  . Not on file  Social History Narrative  . Not on file    Allergies:  Allergies  Allergen Reactions  . Augmentin [Amoxicillin-Pot Clavulanate] Hives  . Ciprofloxacin Itching    Patient states she starts itching from inside or internal.     Medications: Prior to Admission medications   Medication Sig Start Date End Date Taking? Authorizing Provider  albuterol (PROVENTIL HFA;VENTOLIN HFA) 108 (90 Base) MCG/ACT inhaler Inhale 2 puffs into the lungs every 6 (six) hours as needed for wheezing or shortness of breath. 08/29/18   Guse, Jacquelynn Cree, FNP  albuterol (PROVENTIL HFA;VENTOLIN HFA) 108 (90 Base) MCG/ACT inhaler Inhale 2 puffs into the lungs every 6 (six) hours as needed for wheezing or shortness of breath. 10/06/18   Merlyn Lot, MD  bisacodyl (DULCOLAX) 5 MG EC tablet Take 1 tablet (5 mg total) by mouth daily as needed for moderate constipation. 09/26/18   Jodelle Green, FNP  chlorthalidone (HYGROTON) 25 MG tablet Take 1 tablet (25 mg total) by mouth daily. 09/12/18 12/11/18  Wellington Hampshire, MD  cholecalciferol (VITAMIN D3) 25 MCG (1000 UT) tablet Take 1 tablet (1,000 Units total) by mouth daily. 09/26/18   Guse, Jacquelynn Cree, FNP  nebivolol (BYSTOLIC) 10 MG tablet Take 1 tablet (10 mg total) by mouth daily. 09/26/18   Jodelle Green, FNP  omeprazole (PRILOSEC) 20 MG capsule Take 1 capsule (20 mg total) by mouth daily. 09/17/18   Jodelle Green, FNP  potassium chloride SA (K-DUR,KLOR-CON) 20 MEQ tablet Take 1 tablet (20 mEq total) by mouth daily. 09/23/18   Wellington Hampshire, MD  predniSONE (DELTASONE) 20 MG tablet Take 2 tablets (40 mg total) by mouth daily for 5 days. 10/06/18 10/11/18  Merlyn Lot, MD  Probiotic Product (FORTIFY PROBIOTIC WOMENS EX ST) CPDR Take 1 capsule by mouth daily. 09/17/18   Jodelle Green, FNP  spironolactone (ALDACTONE) 25 MG tablet Take 1 tablet (25 mg total) by mouth daily. 09/12/18 12/11/18  Wellington Hampshire, MD    Physical Exam Vitals:  Vitals:   10/10/18 0952  BP: (!) 142/70  Pulse: 70   Patient's last menstrual period was 09/26/2018. Body mass index is 42.91 kg/m.  General: NAD HEENT: normocephalic, anicteric Pulmonary: No increased work of breathing Extremities: no edema, erythema,  or tenderness Neurologic: Grossly intact Psychiatric: mood appropriate, affect full  Female chaperone present for pelvic  portions of the physical exam  Assessment: 43 y.o. G4P0000 follow up uterine fibroids  Plan: Problem List Items Addressed This Visit    None    Visit Diagnoses    Abnormal uterine bleeding    -  Primary   Relevant Orders   US Transvaginal Non-OB   TSH+Prl+FSH+TestT+LH+DHEA S...   Anti mullerian hormone   Intramural and subserous leiomyoma of uterus       Relevant Orders   US Transvaginal Non-OB   Encounter for preconception consultation       Relevant Orders  TSH+Prl+FSH+TestT+LH+DHEA S...   Anti mullerian hormone     1) Labs today including AMH and PCOS panel.Repeat ultrasound ordered to assess for interval change in fibroids.  If low AMH strongly consider [place] or hysterectomy.   Even in the setting of normal AMH she is aware that her overall chance of conceiving is relatively low and that myomectomy would necessitate at least a 12 month recovery. - Discussed additional risks given prior aneurysm  2) A total of 15 minutes were spent in face-to-face contact with the patient during this encounter with over half of that time devoted to counseling and coordination of care.  3) Return in about 1 week (around 10/17/2018) for [place] and follow up.   Malachy Mood, MD, [facility], [facility] 10/10/2018, 10:25 AM

## 2018-10-12 DIAGNOSIS — G4730 Sleep apnea, unspecified: Secondary | ICD-10-CM

## 2018-10-12 HISTORY — DX: Sleep apnea, unspecified: G47.30

## 2018-10-14 ENCOUNTER — Other Ambulatory Visit: Payer: BLUE CROSS/BLUE SHIELD

## 2018-10-15 ENCOUNTER — Telehealth: Payer: Self-pay | Admitting: Internal Medicine

## 2018-10-15 DIAGNOSIS — G4719 Other hypersomnia: Secondary | ICD-10-CM

## 2018-10-15 DIAGNOSIS — G4733 Obstructive sleep apnea (adult) (pediatric): Secondary | ICD-10-CM

## 2018-10-15 NOTE — Telephone Encounter (Addendum)
HST performed on 10/10/18 showed mild OSA with AHI of 10. Recommend auto cpap 5-20cm h2O. Left message to relay results to pt.

## 2018-10-16 LAB — TSH+PRL+FSH+TESTT+LH+DHEA S...
17-Hydroxyprogesterone: 168 ng/dL
Androstenedione: 84 ng/dL (ref 41–262)
DHEA-SO4: 153.3 ug/dL (ref 57.3–279.2)
FSH: 4.2 m[IU]/mL
LH: 7.1 m[IU]/mL
PROLACTIN: 13.8 ng/mL (ref 4.8–23.3)
TSH: 0.507 u[IU]/mL (ref 0.450–4.500)
Testosterone, Free: 1.3 pg/mL (ref 0.0–4.2)
Testosterone: 23 ng/dL (ref 8–48)

## 2018-10-16 LAB — ANTI MULLERIAN HORMONE: ANTI-MULLERIAN HORMONE (AMH): 1.2 ng/mL

## 2018-10-16 NOTE — Telephone Encounter (Signed)
Spoke to pt and relayed below results.  Pt agreed to proceed with cpap therapy. Order has been placed for cpap.  Pt has been scheduled for f/u on 12/17/18 at 8:45a. Nothing further is needed.

## 2018-10-23 ENCOUNTER — Other Ambulatory Visit: Payer: Self-pay

## 2018-10-23 ENCOUNTER — Ambulatory Visit (INDEPENDENT_AMBULATORY_CARE_PROVIDER_SITE_OTHER): Payer: BLUE CROSS/BLUE SHIELD | Admitting: Obstetrics and Gynecology

## 2018-10-23 ENCOUNTER — Ambulatory Visit (INDEPENDENT_AMBULATORY_CARE_PROVIDER_SITE_OTHER): Payer: BLUE CROSS/BLUE SHIELD

## 2018-10-23 ENCOUNTER — Encounter: Payer: Self-pay | Admitting: Obstetrics and Gynecology

## 2018-10-23 VITALS — BP 150/100 | HR 68 | Ht 63.0 in | Wt 252.0 lb

## 2018-10-23 DIAGNOSIS — D252 Subserosal leiomyoma of uterus: Secondary | ICD-10-CM

## 2018-10-23 DIAGNOSIS — N939 Abnormal uterine and vaginal bleeding, unspecified: Secondary | ICD-10-CM | POA: Diagnosis not present

## 2018-10-23 DIAGNOSIS — D251 Intramural leiomyoma of uterus: Secondary | ICD-10-CM

## 2018-10-23 NOTE — Patient Instructions (Signed)
The percentage of IVF cycle starts that resulted in live births was 41.5% in women younger than 38 years, 31.9% in women aged 43-37 years, 22.1% in women aged 42-40 years, 12.4% in women aged 52-42 years, 5% in women aged 44-44 years, and 1 % for women older than 44 years.  The rates of fetal loss and aneuploidy also increase based on maternal age.  Although 9.9% of women younger than 33 years who conceive during IVF with a fresh embryo transfer have a pregnancy loss after 7 weeks of gestation with fetal heart activity observed, the rates of miscarriage progressively increase from 11.4% for women aged 6-34 years to 13.7% for women aged 105-37 years, 19.8% for women aged 38-40 years, 29.9% for women aged 25-42 years, and 36.6% for women older than 71 years ("Female Age- Related Fertility Decline" ACOG Committee Opinion 589, March 2014 Reaffirmed 2018)

## 2018-10-23 NOTE — Progress Notes (Signed)
Gynecology Ultrasound Follow Up  Chief Complaint:  Chief Complaint  Patient presents with  . Follow-up    GYN u/s     History of Present Illness: Patient is a 43 y.o. female who presents today for ultrasound evaluation of of known uterine fibroids last imaged 2016.  Ultrasound demonstrates the following findgins Adnexa: normal Uterus: Globally enlarged with 4 fibroids subserosal and intramural. No submucosal components identified.  Endometrial stripe  8.110mm without evidence of focal abnormaliteis Additional: no free fluid  Review of Systems: ROS  Past Medical History:  Past Medical History:  Diagnosis Date  . Brain aneurysm 06/29/2015  . Chicken pox   . Headache   . Hypertension   . Sleep apnea 10/2018    Past Surgical History:  Past Surgical History:  Procedure Laterality Date  . CRANIOTOMY Right 06/29/2015   Procedure: CRANIOTOMY INTRACRANIAL ANEURYSM CLIPPING;  Surgeon: Kevan Ny Ditty, MD;  Location: [facility];  Service: Neurosurgery;  Laterality: Right;  . IR GENERIC HISTORICAL  05/25/2016   IR ANGIO INTRA EXTRACRAN SEL INTERNAL CAROTID BILAT MOD SED 05/25/2016 Consuella Lose, MD [facility]  . IR GENERIC HISTORICAL  06/29/2015   IR ANGIO INTRA EXTRACRAN SEL INTERNAL CAROTID BILAT MOD SED 06/29/2015 Consuella Lose, MD [facility]  . IR GENERIC HISTORICAL  06/29/2015   IR ANGIO VERTEBRAL SEL VERTEBRAL UNI L MOD SED 06/29/2015 Consuella Lose, MD [facility]  . IR GENERIC HISTORICAL  06/29/2015   IR 3D INDEPENDENT WKST 06/29/2015 Consuella Lose, MD [facility]    Gynecologic History:  Patient's last menstrual period was 10/16/2018 (exact date). Contraception: none Last Pap: 01/16/2018 Results were: .ASCUS with NEGATIVE high risk HPV  Family History:  Family History  Problem Relation Age of Onset  . Hypertension Mother   . Hypertension Father   . Hypertension Maternal Aunt   . Hypertension Maternal Uncle   . Hypertension  Paternal Aunt   . Hypertension Paternal Uncle   . Hypertension Maternal Grandmother   . Hypertension Maternal Grandfather   . Hypertension Paternal Grandmother   . Hypertension Paternal Grandfather     Social History:  Social History   Socioeconomic History  . Marital status: Single    Spouse name: Not on file  . Number of children: Not on file  . Years of education: Not on file  . Highest education level: Not on file  Occupational History  . Not on file  Social Needs  . Financial resource strain: Not on file  . Food insecurity:    Worry: Not on file    Inability: Not on file  . Transportation needs:    Medical: Not on file    Non-medical: Not on file  Tobacco Use  . Smoking status: Current Every Day Smoker    Packs/day: 0.50    Years: 22.00    Pack years: 11.00  . Smokeless tobacco: Never Used  . Tobacco comment: 1 pack lasts a day or two  Substance and Sexual Activity  . Alcohol use: No  . Drug use: No  . Sexual activity: Yes    Birth control/protection: None  Lifestyle  . Physical activity:    Days per week: Not on file    Minutes per session: Not on file  . Stress: Not on file  Relationships  . Social connections:    Talks on phone: Not on file    Gets together: Not on file    Attends religious service: Not on file    Active  member of club or organization: Not on file    Attends meetings of clubs or organizations: Not on file    Relationship status: Not on file  . Intimate partner violence:    Fear of current or ex partner: Not on file    Emotionally abused: Not on file    Physically abused: Not on file    Forced sexual activity: Not on file  Other Topics Concern  . Not on file  Social History Narrative  . Not on file    Allergies:  Allergies  Allergen Reactions  . Augmentin [Amoxicillin-Pot Clavulanate] Hives  . Ciprofloxacin Itching    Patient states she starts itching from inside or internal.    Medications: Prior to Admission medications    Medication Sig Start Date End Date Taking? Authorizing Provider  albuterol (PROVENTIL HFA;VENTOLIN HFA) 108 (90 Base) MCG/ACT inhaler Inhale 2 puffs into the lungs every 6 (six) hours as needed for wheezing or shortness of breath. 08/29/18   Guse, Jacquelynn Cree, FNP  albuterol (PROVENTIL HFA;VENTOLIN HFA) 108 (90 Base) MCG/ACT inhaler Inhale 2 puffs into the lungs every 6 (six) hours as needed for wheezing or shortness of breath. 10/06/18   Merlyn Lot, MD  bisacodyl (DULCOLAX) 5 MG EC tablet Take 1 tablet (5 mg total) by mouth daily as needed for moderate constipation. 09/26/18   Jodelle Green, FNP  chlorthalidone (HYGROTON) 25 MG tablet Take 1 tablet (25 mg total) by mouth daily. 09/12/18 12/11/18  Wellington Hampshire, MD  cholecalciferol (VITAMIN D3) 25 MCG (1000 UT) tablet Take 1 tablet (1,000 Units total) by mouth daily. 09/26/18   Guse, Jacquelynn Cree, FNP  nebivolol (BYSTOLIC) 10 MG tablet Take 1 tablet (10 mg total) by mouth daily. 09/26/18   Jodelle Green, FNP  omeprazole (PRILOSEC) 20 MG capsule Take 1 capsule (20 mg total) by mouth daily. 09/17/18   Jodelle Green, FNP  potassium chloride SA (K-DUR,KLOR-CON) 20 MEQ tablet Take 1 tablet (20 mEq total) by mouth daily. 09/23/18   Wellington Hampshire, MD  Probiotic Product (FORTIFY PROBIOTIC WOMENS EX ST) CPDR Take 1 capsule by mouth daily. 09/17/18   Jodelle Green, FNP  spironolactone (ALDACTONE) 25 MG tablet Take 1 tablet (25 mg total) by mouth daily. 09/12/18 12/11/18  Wellington Hampshire, MD    Physical Exam Vitals: Blood pressure (!) 150/100, pulse 68, height 5\' 3"  (1.6 m), weight 252 lb (114.3 kg), last menstrual period 10/16/2018.   General: NAD HEENT: normocephalic, anicteric Pulmonary: No increased work of breathing Extremities: no edema, erythema, or tenderness Neurologic: Grossly intact, normal gait Psychiatric: mood appropriate, affect full  Dg Chest 2 View  Result Date: 10/06/2018 CLINICAL DATA:  Intermittent chest pain. EXAM: CHEST - 2  VIEW COMPARISON:  August 29, 2018 FINDINGS: The heart size and mediastinal contours are within normal limits. Both lungs are clear. The visualized skeletal structures are unremarkable. IMPRESSION: No active cardiopulmonary disease. Electronically Signed   By: Dorise Bullion III M.D   On: 10/06/2018 16:57   US Transvaginal Non-ob  Result Date: 10/23/2018 ULTRASOUND REPORT Location: [facility] Date of Service: 10/23/2018 Patient Name: Bianca Acosta DOB: 01-Dec-1975 MRN: [id number] Indications:FIBROID UTERUS Findings: The uterus is anteverted and measures 11.71 X 9.39 X 8.10 CM. Echo texture is heterogenous with evidence of focal masses. Within the uterus are multiple suspected fibroids measuring: Fibroid 1:Fundal intramural  With calcified border measures 43.42 x 32.8 mm Fibroid 2:Posterior intramural measures 45.99 x 35.72 mm Fibroid 3: Fundal intramural  measures 27.1 x 20.9 mm Fibroid 4 : Anterior intramural measures 38.41 x 21.92 mm Fibroid 5 : Posterior subserosal measures 23.99 x 26.11 mm The Endometrium measures 8.34 mm. Right Ovary measures 3.63 x 3.77 x 2.28 cm. It is normal in appearance. Left Ovary measures 4.07 x 2.98 x 2.27 cm. It is normal in appearance. Survey of the adnexa demonstrates no adnexal masses. There is no free fluid in the cul de sac. Impression: 1. Anteverted uterus with multiple fibroids are present. Recommendations: 1.Clinical correlation with the patient's History and Physical Exam. Mital bahen Marlowe Sax, RDMS Images reviewed.  Overall fibroid stable in size and number from prior study measuring with 1cm of measurements obtained in 2016 study. Malachy Mood, MD, [facility], [facility] 10/23/2018, 4:08 PM     Comparison Ultrasound 04/27/2015     Assessment: 43 y.o. G4P0000 follow up uterine fibroids  Plan: Problem List Items Addressed This Visit      Genitourinary   Intramural and subserous leiomyoma of uterus - Primary   Relevant  Orders   Ambulatory referral to Interventional Radiology      1) Pregnancy rates are approximately 5% in women aged 59-44 years, and 1 % for women older than 31 years ("Female Age- Related Fertility Decline" ACOG Committee Opinion 589, March 2014 Reaffirmed 2018).  There is insufficient data to clearly rate safety of [place] on subsequent pregnancies with some data to suggest higher pregnancy failure rates, as well as potential adverse effects on AMH and antral follicle counts.   Benefits is minimally invasive nature, relatively quick recovery time, and moderate sedation as opposed to general anesthesia particularly given the patient history of prior aneurysm.  Myomectomy would require general anesthesia, entail a 6-8 week recovery time, and at least a one year interval before attempting to conceive at which point we would also expect a decrease in these numbers as well as conception rates.  Given overall low rate of spontaneous conception neither route guarantees future pregnancy.  Hysterectomy would be definitive treatment for her uterine fibroids.    2) A total of 25 minutes were spent in face-to-face contact with the patient during this encounter with over half of that time devoted to counseling and coordination of care.  3) Return if symptoms worsen or fail to improve.    Malachy Mood, MD, [facility], [facility] 10/23/2018, 3:39 PM

## 2018-10-25 DIAGNOSIS — D252 Subserosal leiomyoma of uterus: Principal | ICD-10-CM

## 2018-10-25 DIAGNOSIS — D251 Intramural leiomyoma of uterus: Secondary | ICD-10-CM | POA: Insufficient documentation

## 2018-11-05 DIAGNOSIS — D251 Intramural leiomyoma of uterus: Secondary | ICD-10-CM | POA: Diagnosis not present

## 2018-11-05 DIAGNOSIS — N924 Excessive bleeding in the premenopausal period: Secondary | ICD-10-CM | POA: Diagnosis not present

## 2018-11-05 DIAGNOSIS — D649 Anemia, unspecified: Secondary | ICD-10-CM | POA: Diagnosis not present

## 2018-11-05 DIAGNOSIS — D252 Subserosal leiomyoma of uterus: Secondary | ICD-10-CM | POA: Diagnosis not present

## 2018-11-10 ENCOUNTER — Other Ambulatory Visit: Payer: BLUE CROSS/BLUE SHIELD

## 2018-11-11 ENCOUNTER — Ambulatory Visit: Payer: BLUE CROSS/BLUE SHIELD | Admitting: Physician Assistant

## 2018-11-14 ENCOUNTER — Other Ambulatory Visit: Payer: BLUE CROSS/BLUE SHIELD

## 2018-11-18 ENCOUNTER — Ambulatory Visit: Payer: BLUE CROSS/BLUE SHIELD | Admitting: Cardiovascular Disease

## 2018-12-17 ENCOUNTER — Ambulatory Visit (INDEPENDENT_AMBULATORY_CARE_PROVIDER_SITE_OTHER): Payer: BLUE CROSS/BLUE SHIELD | Admitting: Internal Medicine

## 2018-12-17 ENCOUNTER — Encounter: Payer: Self-pay | Admitting: Internal Medicine

## 2018-12-17 ENCOUNTER — Telehealth: Payer: Self-pay

## 2018-12-17 DIAGNOSIS — F17210 Nicotine dependence, cigarettes, uncomplicated: Secondary | ICD-10-CM | POA: Diagnosis not present

## 2018-12-17 DIAGNOSIS — G4733 Obstructive sleep apnea (adult) (pediatric): Secondary | ICD-10-CM

## 2018-12-17 DIAGNOSIS — Z720 Tobacco use: Secondary | ICD-10-CM

## 2018-12-17 NOTE — Progress Notes (Addendum)
Name: Bianca Acosta MRN: [id number] DOB: 11/15/1975     CONSULTATION DATE: 2.14.2020 REFERRING MD : Fletcher Anon  CHIEF COMPLAINT: follow up OSA  STUDIES:   08/2018  CXR independently reviewed by Me No acute findings No penumonia No edema No effusions  Sleep Study 09/2018 Mild AHI 10     VIDEO/TELEPHONE VISIT    In the setting of the current Covid19 crisis, you are scheduled for a  visit with me on 12/17/2018  Just as we do with many in-office visits, in order for you to participate in this visit, we must obtain consent.   I can obtain your verbal consent now.  PATIENT AGREES AND CONFIRMS -YES This Visit has Audio and Visual Capabilities for optimal patient care experience   Evaluation Performed:  Follow-up visit  This visit type was conducted due to national recommendations for restrictions regarding the COVID-19 Pandemic (e.g. social distancing).  This format is felt to be most appropriate for this patient at this time.  All issues noted in this document were discussed and addressed.     Virtual Visit via Telephone Note     I connected with patient on 12/17/2018  by telephone and verified that I am speaking with the correct person using two identifiers.   I discussed the limitations, risks, security and privacy concerns of performing an evaluation and management service by telephone and the availability of in person appointments. I also discussed with the patient that there may be a patient responsible charge related to this service. The patient expressed understanding and agreed to proceed.   Location of the patient: Home Location of provider: Home Participating persons: Patient and provider only   HISTORY OF PRESENT ILLNESS: Recent dx with AHI 10 with Sleep Apnea Patient was told of results and was NOT started on CPAP She did NOT get call back and was wondering what happened  No acute infections at this time   Smoking Assessment and Cessation Counseling    Upon further questioning, Patient smokes 1 ppd  I have advised patient to quit/stop smoking as soon as possible due to high risk for multiple medical problems  Patient is NOT willing to quit smoking  I have advised patient that we can assist and have options of Nicotine replacement therapy. I also advised patient on behavioral therapy and can provide oral medication therapy in conjunction with the other therapies  Follow up next Office visit  for assessment of smoking cessation  Smoking cessation counseling advised for 4 minutes        PAST MEDICAL HISTORY :   has a past medical history of Brain aneurysm (06/29/2015), Chicken pox, Headache, Hypertension, and Sleep apnea (10/2018).  has a past surgical history that includes Craniotomy (Right, 06/29/2015); ir generic historical (05/25/2016); ir generic historical (06/29/2015); ir generic historical (06/29/2015); and ir generic historical (06/29/2015). Prior to Admission medications   Medication Sig Start Date End Date Taking? Authorizing Provider  albuterol (PROVENTIL HFA;VENTOLIN HFA) 108 (90 Base) MCG/ACT inhaler Inhale 2 puffs into the lungs every 6 (six) hours as needed for wheezing or shortness of breath. 08/29/18  Yes Guse, Jacquelynn Cree, FNP  bisacodyl (DULCOLAX) 5 MG EC tablet Take 1 tablet (5 mg total) by mouth daily as needed for moderate constipation. 09/26/18  Yes Guse, Jacquelynn Cree, FNP  chlorthalidone (HYGROTON) 25 MG tablet Take 1 tablet (25 mg total) by mouth daily. 09/12/18 12/11/18 Yes Wellington Hampshire, MD  cholecalciferol (VITAMIN D3) 25 MCG (1000 UT) tablet Take 1 tablet (  1,000 Units total) by mouth daily. 09/26/18  Yes Guse, Jacquelynn Cree, FNP  nebivolol (BYSTOLIC) 10 MG tablet Take 1 tablet (10 mg total) by mouth daily. 09/26/18  Yes Guse, Jacquelynn Cree, FNP  omeprazole (PRILOSEC) 20 MG capsule Take 1 capsule (20 mg total) by mouth daily. 09/17/18  Yes Guse, Jacquelynn Cree, FNP  potassium chloride SA (K-DUR,KLOR-CON) 20 MEQ tablet Take 1 tablet (20  mEq total) by mouth daily. 09/23/18  Yes Wellington Hampshire, MD  Probiotic Product (FORTIFY PROBIOTIC WOMENS EX ST) CPDR Take 1 capsule by mouth daily. 09/17/18  Yes Guse, Jacquelynn Cree, FNP  spironolactone (ALDACTONE) 25 MG tablet Take 1 tablet (25 mg total) by mouth daily. 09/12/18 12/11/18 Yes Wellington Hampshire, MD   Allergies  Allergen Reactions  . Augmentin [Amoxicillin-Pot Clavulanate] Hives  . Ciprofloxacin Itching    Patient states she starts itching from inside or internal.    Review of Systems:  Gen:  Denies  fever, sweats, chills weigh loss  HEENT: Denies blurred vision, double vision, ear pain, eye pain, hearing loss, nose bleeds, sore throat Cardiac:  No dizziness, chest pain or heaviness, chest tightness,edema, No JVD Resp:   No cough, -sputum production, -shortness of breath,-wheezing, -hemoptysis,  Gi: Denies swallowing difficulty, stomach pain, nausea or vomiting, diarrhea, constipation, bowel incontinence Gu:  Denies bladder incontinence, burning urine Ext:   Denies Joint pain, stiffness or swelling Skin: Denies  skin rash, easy bruising or bleeding or hives Endoc:  Denies polyuria, polydipsia , polyphagia or weight change Psych:   Denies depression, insomnia or hallucinations  Other:  All other systems negative   ASSESSMENT / PLAN: OSA-needs auto CPAP started ASAP Results discussed with patient  Smoking cessation-strongly advised   Obesity -recommend significant weight loss -recommend changing diet  Deconditioned state -Recommend increased daily activity and exercise    COVID-19 Education: The signs and symptoms of COVID-19 were discussed with the patient and how to seek care for testing (follow up with PCP or arrange E-visit).  The importance of social distancing was discussed today.  TOTAL TIME SPENT 23 Minutes  Patient  satisfied with Plan of action and management. All questions answered   Follow up 2 months   Jennfier Abdulla Patricia Pesa, M.D.  [facility]  Medical Director [place] Director [facility] Cardio-Pulmonary Department

## 2018-12-17 NOTE — Patient Instructions (Signed)
PATIENT NEEDS TO START AUTOCPAP 5-20 ASAP  Recommend smoking cessation

## 2018-12-17 NOTE — Telephone Encounter (Signed)
Dr. Mortimer Fries had phone visit today with patient. She has not yet been set up on CPAP, referral sent to [facility] 10/17/18. Community message sent to Darlina Guys with [facility] for clarification as to why this has not happened yet. Will await information from [facility].

## 2018-12-18 ENCOUNTER — Other Ambulatory Visit: Payer: Self-pay | Admitting: Cardiovascular Disease

## 2018-12-18 NOTE — Telephone Encounter (Signed)
Per Darlina Guys with [employer], The RT Dept has attempted to contact patient three times, however, pt did not answer phone and mailbox was full. [employer] will mail out letter to patient to contact their office to schedule set up appointment. Rhonda J Cobb

## 2018-12-18 NOTE — Telephone Encounter (Signed)
Please review for refill.  

## 2018-12-18 NOTE — Telephone Encounter (Signed)
Sent CM to [place] with [employer].  Order was faxed to [place] in [place]. Rhonda J Cobb

## 2018-12-18 NOTE — Telephone Encounter (Signed)
Ok to refill.  Patient has upcoming appointment already scheduled.

## 2018-12-25 ENCOUNTER — Ambulatory Visit (INDEPENDENT_AMBULATORY_CARE_PROVIDER_SITE_OTHER): Payer: BLUE CROSS/BLUE SHIELD | Admitting: Family Medicine

## 2018-12-25 ENCOUNTER — Other Ambulatory Visit: Payer: Self-pay

## 2018-12-25 ENCOUNTER — Telehealth: Payer: Self-pay | Admitting: Family Medicine

## 2018-12-25 ENCOUNTER — Encounter: Payer: Self-pay | Admitting: Family Medicine

## 2018-12-25 DIAGNOSIS — F419 Anxiety disorder, unspecified: Secondary | ICD-10-CM

## 2018-12-25 DIAGNOSIS — I10 Essential (primary) hypertension: Secondary | ICD-10-CM

## 2018-12-25 DIAGNOSIS — F17210 Nicotine dependence, cigarettes, uncomplicated: Secondary | ICD-10-CM | POA: Diagnosis not present

## 2018-12-25 DIAGNOSIS — D251 Intramural leiomyoma of uterus: Secondary | ICD-10-CM | POA: Diagnosis not present

## 2018-12-25 DIAGNOSIS — D252 Subserosal leiomyoma of uterus: Secondary | ICD-10-CM

## 2018-12-25 DIAGNOSIS — G4733 Obstructive sleep apnea (adult) (pediatric): Secondary | ICD-10-CM

## 2018-12-25 MED ORDER — METOPROLOL SUCCINATE ER 100 MG PO TB24: 100.0000 mg | ORAL_TABLET | Freq: Every day | ORAL | 3 refills | Status: DC

## 2018-12-25 MED ORDER — NEBIVOLOL HCL 10 MG PO TABS: 10.0000 mg | ORAL_TABLET | Freq: Every day | ORAL | 3 refills | Status: DC

## 2018-12-25 NOTE — Progress Notes (Signed)
Patient ID: Bianca Acosta, female   DOB: [birth year], 43 y.o.   MRN: [id number]    Virtual Visit via video note  This visit type was conducted due to national recommendations for restrictions regarding the COVID-19 pandemic (e.g. social distancing).  This format is felt to be most appropriate for this patient at this time.  All issues noted in this document were discussed and addressed.  No physical exam was performed (except for noted visual exam findings with Video Visits).   I connected with Donney Rankins today at  1:00 PM EDT by a video enabled telemedicine application and verified that I am speaking with the correct person using two identifiers. Location patient: home Location provider: [facility] Persons participating in the virtual visit: patient, provider  I discussed the limitations, risks, security and privacy concerns of performing an evaluation and management service by video and the availability of in person appointments. I also discussed with the patient that there may be a patient responsible charge related to this service. The patient expressed understanding and agreed to proceed.  HPI:   Patient and I connected via video for follow-up on blood pressure, smoking and uterine fibroid.  Patient's blood pressure has not been checked recently.  Patient states she does not like checking at home and usually goes by how she feels.  She is almost out of Bystolic sample she was given at last visit and would like more if we do have them.  Denies any chest pain, palpitations, lower extremity swelling shortness of breath or wheezing.  Patient also has seen pulmonology in regards to sleep apnea.  She is going to pick up sleep apnea machine next week and is looking forward to having this.  She continues to smoke.  Does not plan to quit.  Does not feels right time.  She is appointment scheduled at the end of May for evaluation of fibroid and also removal.  Patient is nervous  about surgery however is looking forward to not having the pain anymore    ROS: See pertinent positives and negatives per HPI.  Past Medical History:  Diagnosis Date  . Brain aneurysm 06/29/2015  . Chicken pox   . Headache   . Hypertension   . Sleep apnea 10/2018    Past Surgical History:  Procedure Laterality Date  . CRANIOTOMY Right 06/29/2015   Procedure: CRANIOTOMY INTRACRANIAL ANEURYSM CLIPPING;  Surgeon: Kevan Ny Ditty, MD;  Location: [place] [facility];  Service: Neurosurgery;  Laterality: Right;  . IR GENERIC HISTORICAL  05/25/2016   IR ANGIO INTRA EXTRACRAN SEL INTERNAL CAROTID BILAT MOD SED 05/25/2016 Consuella Lose, MD [facility]  . IR GENERIC HISTORICAL  06/29/2015   IR ANGIO INTRA EXTRACRAN SEL INTERNAL CAROTID BILAT MOD SED 06/29/2015 Consuella Lose, MD [facility]  . IR GENERIC HISTORICAL  06/29/2015   IR ANGIO VERTEBRAL SEL VERTEBRAL UNI L MOD SED 06/29/2015 Consuella Lose, MD [facility]  . IR GENERIC HISTORICAL  06/29/2015   IR 3D INDEPENDENT WKST 06/29/2015 Consuella Lose, MD [facility]    Family History  Problem Relation Age of Onset  . Hypertension Mother   . Hypertension Father   . Hypertension Maternal Aunt   . Hypertension Maternal Uncle   . Hypertension Paternal Aunt   . Hypertension Paternal Uncle   . Hypertension Maternal Grandmother   . Hypertension Maternal Grandfather   . Hypertension Paternal Grandmother   . Hypertension Paternal Grandfather     Social History   Tobacco Use  . Smoking  status: Current Every Day Smoker    Packs/day: 0.50    Years: 22.00    Pack years: 11.00  . Smokeless tobacco: Never Used  . Tobacco comment: 1 pack lasts a day or two  Substance Use Topics  . Alcohol use: No    Current Outpatient Medications:  .  albuterol (PROVENTIL HFA;VENTOLIN HFA) 108 (90 Base) MCG/ACT inhaler, Inhale 2 puffs into the lungs every 6 (six) hours as needed for wheezing or shortness of breath., Disp: 1  Inhaler, Rfl: 0 .  albuterol (PROVENTIL HFA;VENTOLIN HFA) 108 (90 Base) MCG/ACT inhaler, Inhale 2 puffs into the lungs every 6 (six) hours as needed for wheezing or shortness of breath., Disp: 1 Inhaler, Rfl: 2 .  bisacodyl (DULCOLAX) 5 MG EC tablet, Take 1 tablet (5 mg total) by mouth daily as needed for moderate constipation., Disp: 30 tablet, Rfl: 1 .  cholecalciferol (VITAMIN D3) 25 MCG (1000 UT) tablet, Take 1 tablet (1,000 Units total) by mouth daily., Disp: 90 tablet, Rfl: 3 .  KLOR-CON M20 20 MEQ tablet, TAKE 1 TABLET BY MOUTH EVERY DAY, Disp: 90 tablet, Rfl: 1 .  nebivolol (BYSTOLIC) 10 MG tablet, Take 1 tablet (10 mg total) by mouth daily., Disp: 90 tablet, Rfl: 3 .  omeprazole (PRILOSEC) 20 MG capsule, Take 1 capsule (20 mg total) by mouth daily., Disp: 30 capsule, Rfl: 3 .  Probiotic Product (FORTIFY PROBIOTIC WOMENS EX ST) CPDR, Take 1 capsule by mouth daily., Disp: 30 capsule, Rfl: 2 .  chlorthalidone (HYGROTON) 25 MG tablet, Take 1 tablet (25 mg total) by mouth daily., Disp: 90 tablet, Rfl: 3 .  spironolactone (ALDACTONE) 25 MG tablet, Take 1 tablet (25 mg total) by mouth daily., Disp: 90 tablet, Rfl: 3  EXAM:  GENERAL: alert, oriented, appears well and in no acute distress  HEENT: atraumatic, conjunttiva clear, no obvious abnormalities on inspection of external nose and ears  NECK: normal movements of the head and neck  LUNGS: on inspection no signs of respiratory distress, breathing rate appears normal, no obvious gross SOB, gasping or wheezing  CV: no obvious cyanosis  MS: moves all visible extremities without noticeable abnormality  PSYCH/NEURO: pleasant and cooperative, no obvious depression or anxiety, speech and thought processing grossly intact  ASSESSMENT AND PLAN:  Discussed the following assessment and plan:  Essential hypertension - Plan: DISCONTINUED: nebivolol (BYSTOLIC) 10 MG tablet  Cigarette smoker  Intramural and subserous leiomyoma of uterus  OSA  (obstructive sleep apnea)  Anxiety   Encourage patient to check blood pressure at home or at a pharmacy to let us know the reading.  We do not have Bystolic samples, it is too expensive through her insurance so we will change to metoprolol daily.  Greater than 3 minutes spent discussing smoking cessation.  Does not feel she can quit smoking at this time.  She will continue follow-up with pulmonology for management of sleep apnea.  She will continue following with GYN for uterine fibroid evaluation gyne surgical removal.  Long discussion with patient in regards to anxiety over her health and no concerns over her risk in regards to coronavirus.  States they are being called back to work soon and she is very nervous about this.  Advised patient that I can write a note explaining that she is high risk and should not return to work at this time, we will give her a tentative return to work date of June 1 and reevaluate at that time.  Discussed diligent handwashing, wearing a mask  and avoiding large crowds as a way to protect self from possible coronavirus exposure.   I discussed the assessment and treatment plan with the patient. The patient was provided an opportunity to ask questions and all were answered. The patient agreed with the plan and demonstrated an understanding of the instructions.   The patient was advised to call back or seek an in-person evaluation if the symptoms worsen or if the condition fails to improve as anticipated.  I provided 25 minutes of video face-to-face time during this encounter.  We will plan to have patient follow-up in 3 months for recheck of chronic conditions.  She is aware she can return to clinic sooner if any issues arise.  Jodelle Green, FNP

## 2018-12-25 NOTE — Telephone Encounter (Signed)
Will do metoprolol instead  New rx sent

## 2018-12-25 NOTE — Telephone Encounter (Signed)
Copied from [place] [phone]. Topic: General - Inquiry >> Dec 25, 2018  3:13 PM Richardo Priest, [place] wrote: Reason for CRM: Patient is calling in stating she is in need of an alternative to the nebivolol (BYSTOLIC) 10 MG tablet, due to being too expensive. Call back is [phone].

## 2018-12-25 NOTE — Telephone Encounter (Signed)
Please call to schedule 3 month follow up for patient

## 2018-12-25 NOTE — Telephone Encounter (Signed)
Appt for 03/27/19 @ 3:00pm

## 2018-12-25 NOTE — Telephone Encounter (Signed)
See below

## 2018-12-26 NOTE — Telephone Encounter (Signed)
Called patient back regarding there medication metoprolol succinate . Pt stated that had talked with someone at the office regarding this medication. And she has no other questions at this time.

## 2018-12-26 NOTE — Telephone Encounter (Signed)
Unable to  leave message for patient to return call back VM box Full. PEC may give and obtain information.

## 2018-12-29 DIAGNOSIS — G4733 Obstructive sleep apnea (adult) (pediatric): Secondary | ICD-10-CM | POA: Diagnosis not present

## 2019-01-06 ENCOUNTER — Ambulatory Visit (INDEPENDENT_AMBULATORY_CARE_PROVIDER_SITE_OTHER): Payer: BLUE CROSS/BLUE SHIELD

## 2019-01-06 ENCOUNTER — Other Ambulatory Visit: Payer: Self-pay

## 2019-01-06 DIAGNOSIS — R0602 Shortness of breath: Secondary | ICD-10-CM

## 2019-01-07 ENCOUNTER — Telehealth: Payer: Self-pay

## 2019-01-07 NOTE — Telephone Encounter (Signed)
Call attempted to switch face to face office visit with Dr. Fletcher Anon on 01/09/2019 to a telephone or video visit due to current clinic policies related to [place] 19 precautions. Left voicemail message requesting for patient to call back to discuss.

## 2019-01-07 NOTE — Telephone Encounter (Signed)
Virtual Visit Pre-Appointment Phone Call  "Bianca Acosta, I am calling you today to discuss your upcoming appointment. We are currently trying to limit exposure to the virus that causes COVID-19 by seeing patients at home rather than in the office."  1. "What is the BEST phone number to call the day of the visit?" - include this in appointment notes  2. "Do you have or have access to (through a family member/friend) a smartphone with video capability that we can use for your visit?" a. If yes - list this number in appt notes as "cell" (if different from BEST phone #) and list the appointment type as a VIDEO visit in appointment notes b. If no - list the appointment type as a PHONE visit in appointment notes  3. Confirm consent - "In the setting of the current Covid19 crisis, you are scheduled for a video visit with your provider on Jan 09, 2019 at 10:40AM.  Just as we do with many in-office visits, in order for you to participate in this visit, we must obtain consent.  If you'd like, I can send this to your mychart (if signed up) or email for you to review.  Otherwise, I can obtain your verbal consent now.  All virtual visits are billed to your insurance company just like a normal visit would be.  By agreeing to a virtual visit, we'd like you to understand that the technology does not allow for your provider to perform an examination, and thus may limit your provider's ability to fully assess your condition. If your provider identifies any concerns that need to be evaluated in person, we will make arrangements to do so.  Finally, though the technology is pretty good, we cannot assure that it will always work on either your or our end, and in the setting of a video visit, we may have to convert it to a phone-only visit.  In either situation, we cannot ensure that we have a secure connection.  Are you willing to proceed?" STAFF: Did the patient verbally acknowledge consent to telehealth visit? Document  YES/NO here: YES  4. Advise patient to be prepared - "Two hours prior to your appointment, go ahead and check your blood pressure, pulse, oxygen saturation, and your weight (if you have the equipment to check those) and write them all down. When your visit starts, your provider will ask you for this information. If you have an Apple Watch or Kardia device, please plan to have heart rate information ready on the day of your appointment. Please have a pen and paper handy nearby the day of the visit as well."  5. Give patient instructions for MyChart download to smartphone OR Doximity/Doxy.me as below if video visit (depending on what platform provider is using)  6. Inform patient they will receive a phone call 15 minutes prior to their appointment time (may be from unknown caller ID) so they should be prepared to answer    Bianca Acosta has been deemed a candidate for a follow-up tele-health visit to limit community exposure during the Covid-19 pandemic. I spoke with the patient via phone to ensure availability of phone/video source, confirm preferred email & phone number, and discuss instructions and expectations.  I reminded Bianca Acosta to be prepared with any vital sign and/or heart rhythm information that could potentially be obtained via home monitoring, at the time of her visit. I reminded Bianca Acosta to expect a phone call prior to  her visit.  Rene Paci McClain 01/07/2019 12:16 PM   INSTRUCTIONS FOR DOWNLOADING THE MYCHART APP TO SMARTPHONE  - The patient must first make sure to have activated MyChart and know their login information - If Apple, go to [employer] and type in MyChart in the search bar and download the app. If Android, ask patient to go to [employer] and type in [place] in the search bar and download the app. The app is free but as with any other app downloads, their phone may require them to verify saved payment  information or Apple/Android password.  - The patient will need to then log into the app with their MyChart username and password, and select [facility] as their healthcare provider to link the account. When it is time for your visit, go to the MyChart app, find appointments, and click Begin Video Visit. Be sure to Select Allow for your device to access the Microphone and Camera for your visit. You will then be connected, and your provider will be with you shortly.  **If they have any issues connecting, or need assistance please contact MyChart service desk [phone])**  **If using a computer, in order to ensure the best quality for their visit they will need to use either of the following Internet Browsers: [employer], or Google Chrome**  IF USING DOXIMITY or DOXY.ME - The patient will receive a link just prior to their visit by text.     FULL LENGTH CONSENT FOR TELE-HEALTH VISIT   I hereby voluntarily request, consent and authorize [facility] and its employed or contracted physicians, physician assistants, nurse practitioners or other licensed health care professionals (the Practitioner), to provide me with telemedicine health care services (the "Services") as deemed necessary by the treating Practitioner. I acknowledge and consent to receive the Services by the Practitioner via telemedicine. I understand that the telemedicine visit will involve communicating with the Practitioner through live audiovisual communication technology and the disclosure of certain medical information by electronic transmission. I acknowledge that I have been given the opportunity to request an in-person assessment or other available alternative prior to the telemedicine visit and am voluntarily participating in the telemedicine visit.  I understand that I have the right to withhold or withdraw my consent to the use of telemedicine in the course of my care at any time, without affecting my right  to future care or treatment, and that the Practitioner or I may terminate the telemedicine visit at any time. I understand that I have the right to inspect all information obtained and/or recorded in the course of the telemedicine visit and may receive copies of available information for a reasonable fee.  I understand that some of the potential risks of receiving the Services via telemedicine include:  Marland Kitchen Delay or interruption in medical evaluation due to technological equipment failure or disruption; . Information transmitted may not be sufficient (e.g. poor resolution of images) to allow for appropriate medical decision making by the Practitioner; and/or  . In rare instances, security protocols could fail, causing a breach of personal health information.  Furthermore, I acknowledge that it is my responsibility to provide information about my medical history, conditions and care that is complete and accurate to the best of my ability. I acknowledge that Practitioner's advice, recommendations, and/or decision may be based on factors not within their control, such as incomplete or inaccurate data provided by me or distortions of diagnostic images or specimens that may result from electronic transmissions.  I understand that the practice of medicine is not an exact science and that Practitioner makes no warranties or guarantees regarding treatment outcomes. I acknowledge that I will receive a copy of this consent concurrently upon execution via email to the email address I last provided but may also request a printed copy by calling the office of [facility].    I understand that my insurance will be billed for this visit.   I have read or had this consent read to me. . I understand the contents of this consent, which adequately explains the benefits and risks of the Services being provided via telemedicine.  . I have been provided ample opportunity to ask questions regarding this consent and the Services  and have had my questions answered to my satisfaction. . I give my informed consent for the services to be provided through the use of telemedicine in my medical care  By participating in this telemedicine visit I agree to the above.

## 2019-01-08 ENCOUNTER — Telehealth: Payer: Self-pay | Admitting: Cardiovascular Disease

## 2019-01-08 NOTE — Telephone Encounter (Signed)
Patient returning call for results Patient at work and will not be able to answer Will wait for results at appointment tomorrow

## 2019-01-09 ENCOUNTER — Telehealth (INDEPENDENT_AMBULATORY_CARE_PROVIDER_SITE_OTHER): Payer: BLUE CROSS/BLUE SHIELD | Admitting: Cardiovascular Disease

## 2019-01-09 ENCOUNTER — Encounter: Payer: Self-pay | Admitting: Cardiovascular Disease

## 2019-01-09 ENCOUNTER — Other Ambulatory Visit: Payer: Self-pay

## 2019-01-09 VITALS — BP 161/103 | HR 86 | Ht 63.0 in | Wt 250.0 lb

## 2019-01-09 DIAGNOSIS — I10 Essential (primary) hypertension: Secondary | ICD-10-CM

## 2019-01-09 DIAGNOSIS — Z720 Tobacco use: Secondary | ICD-10-CM | POA: Diagnosis not present

## 2019-01-09 DIAGNOSIS — D689 Coagulation defect, unspecified: Secondary | ICD-10-CM | POA: Diagnosis not present

## 2019-01-09 DIAGNOSIS — R0600 Dyspnea, unspecified: Secondary | ICD-10-CM

## 2019-01-09 DIAGNOSIS — Z01812 Encounter for preprocedural laboratory examination: Secondary | ICD-10-CM | POA: Diagnosis not present

## 2019-01-09 DIAGNOSIS — G4730 Sleep apnea, unspecified: Secondary | ICD-10-CM

## 2019-01-09 MED ORDER — CARVEDILOL 6.25 MG PO TABS: 6.2500 mg | ORAL_TABLET | Freq: Two times a day (BID) | ORAL | 6 refills | Status: DC

## 2019-01-09 NOTE — Progress Notes (Signed)
Virtual Visit via Video Note   This visit type was conducted due to national recommendations for restrictions regarding the COVID-19 Pandemic (e.g. social distancing) in an effort to limit this patient's exposure and mitigate transmission in our community.  Due to her co-morbid illnesses, this patient is at least at moderate risk for complications without adequate follow up.  This format is felt to be most appropriate for this patient at this time.  All issues noted in this document were discussed and addressed.  A limited physical exam was performed with this format.  Please refer to the patient's chart for her consent to telehealth for [facility].   Date:  01/09/2019   ID:  Bianca Acosta, DOB 12-Sep-1975, MRN [id number]  Patient Location: Home Provider Location: Office  PCP:  Jodelle Green, FNP  Cardiologist:  Kathlyn Sacramento, MD  Electrophysiologist:  None   Evaluation Performed:  Follow-Up Visit  Chief Complaint: Elevated blood pressure  History of Present Illness:    Bianca Acosta is a 43 y.o. female was seen via video visit for follow-up regarding resistant hypertension. She has a known history of HTN, tobacco use, obesity and brain aneurysum.  She reports prolonged history of difficult to control hypertension which started in early 8s.  She has strong family history of hypertension.   She was seen in January and at that time she was on  Bystolic and triamterene hydrochlorothiazide.  She did not tolerate amlodipine well as it made her feel sick on her stomach.  She was seen by me in January and reported taking Aleve for headache and menstrual pain.  She also complained of dyspnea with minimal exertion.  I advised her to decrease NSAID use.  She was suspected of having sleep apnea and was referred for evaluation. I switched her from hydrochlorothiazide to chlorthalidone and added spironolactone 25 mg once daily.  Her EKG was noted to be abnormal with T wave changes  suggestive of ischemia and prolonged QT interval.  She underwent an echocardiogram which showed normal LV systolic function with no significant valvular abnormalities.  There was moderate LVH. She was seen by pulmonary and was diagnosed with sleep apnea.  She is having hard time adjusting to CPAP. She could not afford Bystolic due to cost.  She denies chest pain or shortness of breath.  She feels better overall.  Her blood pressure is elevated today but she forgot to take care blood pressure medications yesterday and has not taken them yet this morning.  The patient has fibroids and needs to have surgery in the near future. She underwent a recent echocardiogram which showed normal LV systolic function with moderate LVH.   The patient does not have symptoms concerning for COVID-19 infection (fever, chills, cough, or new shortness of breath).    Past Medical History:  Diagnosis Date  . Brain aneurysm 06/29/2015  . Chicken pox   . Headache   . Hypertension   . Sleep apnea 10/2018   Past Surgical History:  Procedure Laterality Date  . CRANIOTOMY Right 06/29/2015   Procedure: CRANIOTOMY INTRACRANIAL ANEURYSM CLIPPING;  Surgeon: Kevan Ny Ditty, MD;  Location: [facility];  Service: Neurosurgery;  Laterality: Right;  . IR GENERIC HISTORICAL  05/25/2016   IR ANGIO INTRA EXTRACRAN SEL INTERNAL CAROTID BILAT MOD SED 05/25/2016 Consuella Lose, MD [facility]  . IR GENERIC HISTORICAL  06/29/2015   IR ANGIO INTRA EXTRACRAN SEL INTERNAL CAROTID BILAT MOD SED 06/29/2015 Consuella Lose, MD [facility]  .  IR GENERIC HISTORICAL  06/29/2015   IR ANGIO VERTEBRAL SEL VERTEBRAL UNI L MOD SED 06/29/2015 Consuella Lose, MD [facility]  . IR GENERIC HISTORICAL  06/29/2015   IR 3D INDEPENDENT WKST 06/29/2015 Consuella Lose, MD [facility]     Current Meds  Medication Sig  . albuterol (PROVENTIL HFA;VENTOLIN HFA) 108 (90 Base) MCG/ACT inhaler Inhale 2 puffs into the lungs every 6  (six) hours as needed for wheezing or shortness of breath.  . chlorthalidone (HYGROTON) 25 MG tablet Take 1 tablet (25 mg total) by mouth daily.  . cholecalciferol (VITAMIN D3) 25 MCG (1000 UT) tablet Take 1 tablet (1,000 Units total) by mouth daily.  . Probiotic Product (FORTIFY PROBIOTIC WOMENS EX ST) CPDR Take 1 capsule by mouth daily.  Marland Kitchen spironolactone (ALDACTONE) 25 MG tablet Take 1 tablet (25 mg total) by mouth daily.     Allergies:   Amoxicillin; Augmentin [amoxicillin-pot clavulanate]; Ciprofloxacin; and Clavulanic acid   Social History   Tobacco Use  . Smoking status: Current Every Day Smoker    Packs/day: 0.50    Years: 22.00    Pack years: 11.00  . Smokeless tobacco: Never Used  . Tobacco comment: 1 pack lasts a day or two  Substance Use Topics  . Alcohol use: No  . Drug use: No     Family Hx: The patient's family history includes Hypertension in her father, maternal aunt, maternal grandfather, maternal grandmother, maternal uncle, mother, paternal aunt, paternal grandfather, paternal grandmother, and paternal uncle.  ROS:   Please see the history of present illness.     All other systems reviewed and are negative.   Prior CV studies:   The following studies were reviewed today:  Reviewed results of recent echocardiogram with her  Labs/Other Tests and Data Reviewed:    EKG:  No ECG reviewed.  Recent Labs: 08/29/2018: ALT 14 10/06/2018: BUN 16; Creatinine, Ser 0.98; Hemoglobin 13.7; Platelets 230; Potassium 3.0; Sodium 140 10/10/2018: TSH 0.507   Recent Lipid Panel Lab Results  Component Value Date/Time   CHOL 134 05/16/2018 09:27 AM   TRIG 81 05/16/2018 09:27 AM   HDL 49 (L) 05/16/2018 09:27 AM   CHOLHDL 2.7 05/16/2018 09:27 AM   LDLCALC 69 05/16/2018 09:27 AM    Wt Readings from Last 3 Encounters:  01/09/19 250 lb (113.4 kg)  10/23/18 252 lb (114.3 kg)  10/10/18 250 lb (113.4 kg)     Objective:    Vital Signs:  BP (!) 161/103 Comment: this BP  is without her medications this am  Pulse 86   Ht 5\' 3"  (1.6 m)   Wt 250 lb (113.4 kg)   BMI 44.29 kg/m    VITAL SIGNS:  reviewed GEN:  no acute distress EYES:  sclerae anicteric, EOMI - Extraocular Movements Intact RESPIRATORY:  normal respiratory effort, symmetric expansion CARDIOVASCULAR:  no peripheral edema SKIN:  no rash, lesions or ulcers. MUSCULOSKELETAL:  no obvious deformities. NEURO:  alert and oriented x 3, no obvious focal deficit PSYCH:  normal affect  ASSESSMENT & PLAN:    1.  Refractory hypertension: The patient has prolonged history of hypertension and strong family history of difficult to control hypertension.  Sleep apnea is likely contributing to uncontrolled blood pressure. Continue treatment with chlorthalidone and spironolactone.  She reports no side effects.  She did have hypokalemia and was prescribed potassium chloride but had difficulty swallowing the pill.  She is going for repeat labs soon I asked her to forward these results to [place].  She could not afford Bystolic.  Thus, I am switching her to carvedilol 6.25 mg twice daily which should work better than metoprolol for blood pressure control.  2.  Exertional dyspnea: Likely due to diastolic heart failure in the setting of uncontrolled hypertension.  She reports improvement in symptoms.  Ischemic cardiac evaluation can be considered in the future if needed.  3.  Tobacco use: I discussed with her the importance of smoking cessation.  4.  Possible sleep apnea: I encouraged her to continue use of CPAP.  COVID-19 Education: The signs and symptoms of COVID-19 were discussed with the patient and how to seek care for testing (follow up with PCP or arrange E-visit).  The importance of social distancing was discussed today.  Time:   Today, I have spent 15 minutes with the patient with telehealth technology discussing the above problems.     Medication Adjustments/Labs and Tests Ordered: Current medicines are  reviewed at length with the patient today.  Concerns regarding medicines are outlined above.   Tests Ordered: No orders of the defined types were placed in this encounter.   Medication Changes: Meds ordered this encounter  Medications  . carvedilol (COREG) 6.25 MG tablet    Sig: Take 1 tablet (6.25 mg total) by mouth 2 (two) times daily.    Dispense:  60 tablet    Refill:  6    To replace Bystolic and Metoprolol    Disposition:  Follow up in 3 month(s)  Signed, Kathlyn Sacramento, MD  01/09/2019 10:56 AM    [facility]

## 2019-01-09 NOTE — Patient Instructions (Signed)
Medication Instructions:  Do not start metoprolol.  Instead, start carvedilol 6.25 mg twice daily.  A prescription was sent.   If you need a refill on your cardiac medications before your next appointment, please call your pharmacy.   Lab work: Proceed with your scheduled lab work and ask [facility] to forward results to [place]  If you have labs (blood work) drawn today and your tests are completely normal, you will receive your results only by: Marland Kitchen MyChart Message (if you have MyChart) OR . A paper copy in the mail If you have any lab test that is abnormal or we need to change your treatment, we will call you to review the results.  Testing/Procedures: None  Follow-Up: At [facility], you and your health needs are our priority.  As part of our continuing mission to provide you with exceptional heart care, we have created designated Provider Care Teams.  These Care Teams include your primary Cardiologist (physician) and Advanced Practice Providers (APPs -  Physician Assistants and Nurse Practitioners) who all work together to provide you with the care you need, when you need it. You will need a follow up appointment in 3 months.  Please call our office 2 months in advance to schedule this appointment.  You may see Kathlyn Sacramento, MD or one of the following Advanced Practice Providers on your designated Care Team:   Murray Hodgkins, NP Christell Faith, PA-C . Marrianne Mood, PA-C

## 2019-01-20 DIAGNOSIS — D251 Intramural leiomyoma of uterus: Secondary | ICD-10-CM | POA: Diagnosis not present

## 2019-01-20 DIAGNOSIS — D250 Submucous leiomyoma of uterus: Secondary | ICD-10-CM | POA: Diagnosis not present

## 2019-01-20 DIAGNOSIS — D252 Subserosal leiomyoma of uterus: Secondary | ICD-10-CM | POA: Diagnosis not present

## 2019-01-20 DIAGNOSIS — R109 Unspecified abdominal pain: Secondary | ICD-10-CM | POA: Diagnosis not present

## 2019-01-29 DIAGNOSIS — G4733 Obstructive sleep apnea (adult) (pediatric): Secondary | ICD-10-CM | POA: Diagnosis not present

## 2019-02-11 ENCOUNTER — Telehealth: Payer: Self-pay | Admitting: Internal Medicine

## 2019-02-11 NOTE — Telephone Encounter (Signed)
Pt has not been upset on cpap 31-90 days.  Left message in regards to upcoming appt for 02/16/2019. If pt is not having any new or worsen symptoms, we will need to push appt out.

## 2019-02-11 NOTE — Telephone Encounter (Signed)
appt has been rescheduled for 02/20/2019.

## 2019-02-16 ENCOUNTER — Ambulatory Visit: Payer: BLUE CROSS/BLUE SHIELD | Admitting: Internal Medicine

## 2019-02-17 DIAGNOSIS — Z48812 Encounter for surgical aftercare following surgery on the circulatory system: Secondary | ICD-10-CM | POA: Diagnosis not present

## 2019-02-17 DIAGNOSIS — D259 Leiomyoma of uterus, unspecified: Secondary | ICD-10-CM | POA: Diagnosis not present

## 2019-02-20 ENCOUNTER — Encounter: Payer: Self-pay | Admitting: Internal Medicine

## 2019-02-20 ENCOUNTER — Ambulatory Visit (INDEPENDENT_AMBULATORY_CARE_PROVIDER_SITE_OTHER): Payer: BC Managed Care – PPO | Admitting: Internal Medicine

## 2019-02-20 DIAGNOSIS — F17210 Nicotine dependence, cigarettes, uncomplicated: Secondary | ICD-10-CM | POA: Diagnosis not present

## 2019-02-20 DIAGNOSIS — G4733 Obstructive sleep apnea (adult) (pediatric): Secondary | ICD-10-CM | POA: Diagnosis not present

## 2019-02-20 NOTE — Progress Notes (Signed)
Name: Bianca Acosta MRN: [id number] DOB: Jan 16, 1976     CONSULTATION DATE: 2.14.2020 REFERRING MD : Fletcher Anon  CHIEF COMPLAINT: follow up OSA  STUDIES:   08/2018  CXR independently reviewed by Me No acute findings No penumonia No edema No effusions  Sleep Study 09/2018 Mild AHI 10   I connected with the patient by video/telephone enabled telemedicine visit and verified that I am speaking with the correct person using two identifiers.    I discussed the limitations, risks, security and privacy concerns of performing an evaluation and management service by telemedicine and the availability of in-person appointments. I also discussed with the patient that there may be a patient responsible charge related to this service. The patient expressed understanding and agreed to proceed.  PATIENT AGREES AND CONFIRMS -YES   Other persons participating in the visit and their role in the encounter: Patient, nursing   Patient's location: Home Provider's location: Clinic   I discussed the limitations, risks, security and privacy concerns of performing an evaluation and management service by telephone and the availability of in person appointments. I also discussed with the patient that there may be a patient responsible charge related to this service. The patient expressed understanding and agreed to proceed.  This visit type was conducted due to national recommendations for restrictions regarding the COVID-19 Pandemic (e.g. social distancing).  This format is felt to be most appropriate for this patient at this time.  All issues noted in this document were discussed and addressed.        HISTORY OF PRESENT ILLNESS:  Recent Dx with OSA AHI 10 Patient started CPAP but sinus congestion She wants to try to Vicks to open up airways  No acute infections at this time Still smokes    Smoking Assessment and Cessation Counseling   Upon further questioning, Patient smokes 1 ppd  I have  advised patient to quit/stop smoking as soon as possible due to high risk for multiple medical problems  Patient  is NOT willing to quit smoking  I have advised patient that we can assist and have options of Nicotine replacement therapy. I also advised patient on behavioral therapy and can provide oral medication therapy in conjunction with the other therapies  Follow up next Office visit  for assessment of smoking cessation  Smoking cessation counseling advised for 4 minutes     PAST MEDICAL HISTORY :   has a past medical history of Brain aneurysm (06/29/2015), Chicken pox, Headache, Hypertension, and Sleep apnea (10/2018).  has a past surgical history that includes Craniotomy (Right, 06/29/2015); ir generic historical (05/25/2016); ir generic historical (06/29/2015); ir generic historical (06/29/2015); and ir generic historical (06/29/2015). Prior to Admission medications   Medication Sig Start Date End Date Taking? Authorizing Provider  albuterol (PROVENTIL HFA;VENTOLIN HFA) 108 (90 Base) MCG/ACT inhaler Inhale 2 puffs into the lungs every 6 (six) hours as needed for wheezing or shortness of breath. 08/29/18  Yes Guse, Jacquelynn Cree, FNP  bisacodyl (DULCOLAX) 5 MG EC tablet Take 1 tablet (5 mg total) by mouth daily as needed for moderate constipation. 09/26/18  Yes Guse, Jacquelynn Cree, FNP  chlorthalidone (HYGROTON) 25 MG tablet Take 1 tablet (25 mg total) by mouth daily. 09/12/18 12/11/18 Yes Wellington Hampshire, MD  cholecalciferol (VITAMIN D3) 25 MCG (1000 UT) tablet Take 1 tablet (1,000 Units total) by mouth daily. 09/26/18  Yes Guse, Jacquelynn Cree, FNP  nebivolol (BYSTOLIC) 10 MG tablet Take 1 tablet (10 mg total) by mouth daily. 09/26/18  Yes Guse, Jacquelynn Cree, FNP  omeprazole (PRILOSEC) 20 MG capsule Take 1 capsule (20 mg total) by mouth daily. 09/17/18  Yes Guse, Jacquelynn Cree, FNP  potassium chloride SA (K-DUR,KLOR-CON) 20 MEQ tablet Take 1 tablet (20 mEq total) by mouth daily. 09/23/18  Yes Wellington Hampshire, MD   Probiotic Product (FORTIFY PROBIOTIC WOMENS EX ST) CPDR Take 1 capsule by mouth daily. 09/17/18  Yes Guse, Jacquelynn Cree, FNP  spironolactone (ALDACTONE) 25 MG tablet Take 1 tablet (25 mg total) by mouth daily. 09/12/18 12/11/18 Yes Wellington Hampshire, MD   Allergies  Allergen Reactions  . Amoxicillin   . Augmentin [Amoxicillin-Pot Clavulanate] Hives  . Ciprofloxacin Itching    Patient states she starts itching from inside or internal.  . Clavulanic Acid      Review of Systems:  Gen:  Denies  fever, sweats, chills weigh loss  HEENT: Denies blurred vision, double vision, ear pain, eye pain, hearing loss, nose bleeds, sore throat Cardiac:  No dizziness, chest pain or heaviness, chest tightness,edema, No JVD Resp:   No cough, -sputum production, -shortness of breath,-wheezing, -hemoptysis,  Gi: Denies swallowing difficulty, stomach pain, nausea or vomiting, diarrhea, constipation, bowel incontinence Gu:  Denies bladder incontinence, burning urine Ext:   Denies Joint pain, stiffness or swelling Skin: Denies  skin rash, easy bruising or bleeding or hives Endoc:  Denies polyuria, polydipsia , polyphagia or weight change Psych:   Denies depression, insomnia or hallucinations  Other:  All other systems negative    ASSESSMENT / PLAN: OSA patient started on CPAP 3 days ago Smoking cessation strongly advised   Obesity -recommend significant weight loss -recommend changing diet  Deconditioned state -Recommend increased daily activity and exercise     COVID-19 EDUCATION: The signs and symptoms of COVID-19 were discussed with the patient and how to seek care for testing.  The importance of social distancing was discussed today. Hand Washing Techniques and avoid touching face was advised.  MEDICATION ADJUSTMENTS/LABS AND TESTS ORDERED:    CURRENT MEDICATIONS REVIEWED AT LENGTH WITH PATIENT TODAY   Patient satisfied with Plan of action and management. All questions answered  Follow  up in 6 months  Total Time Spent 28 mins  Maretta Bees Patricia Pesa, M.D.  [facility]  Medical Director [place] Director [facility] Cardio-Pulmonary Department

## 2019-02-20 NOTE — Patient Instructions (Signed)
Continue CPAP as prescribed patient would like to use Vicks prior to using  Stop Smoking!!

## 2019-02-28 DIAGNOSIS — G4733 Obstructive sleep apnea (adult) (pediatric): Secondary | ICD-10-CM | POA: Diagnosis not present

## 2019-03-27 ENCOUNTER — Ambulatory Visit: Payer: BLUE CROSS/BLUE SHIELD | Admitting: Family Medicine

## 2019-03-27 DIAGNOSIS — Z0289 Encounter for other administrative examinations: Secondary | ICD-10-CM

## 2019-03-31 DIAGNOSIS — G4733 Obstructive sleep apnea (adult) (pediatric): Secondary | ICD-10-CM | POA: Diagnosis not present

## 2019-07-12 ENCOUNTER — Encounter: Payer: Self-pay | Admitting: Emergency Medicine

## 2019-07-12 ENCOUNTER — Emergency Department: Payer: BC Managed Care – PPO

## 2019-07-12 ENCOUNTER — Emergency Department
Admission: EM | Admit: 2019-07-12 | Discharge: 2019-07-12 | Disposition: A | Discharge status: Home / Self Care | Payer: BC Managed Care – PPO | Attending: Emergency Medicine | Admitting: Emergency Medicine

## 2019-07-12 ENCOUNTER — Other Ambulatory Visit: Payer: Self-pay

## 2019-07-12 DIAGNOSIS — Z79899 Other long term (current) drug therapy: Secondary | ICD-10-CM | POA: Diagnosis not present

## 2019-07-12 DIAGNOSIS — I10 Essential (primary) hypertension: Secondary | ICD-10-CM | POA: Diagnosis not present

## 2019-07-12 DIAGNOSIS — J4520 Mild intermittent asthma, uncomplicated: Secondary | ICD-10-CM | POA: Insufficient documentation

## 2019-07-12 DIAGNOSIS — R0981 Nasal congestion: Secondary | ICD-10-CM | POA: Diagnosis not present

## 2019-07-12 DIAGNOSIS — Z20828 Contact with and (suspected) exposure to other viral communicable diseases: Secondary | ICD-10-CM | POA: Diagnosis not present

## 2019-07-12 DIAGNOSIS — Z20822 Contact with and (suspected) exposure to covid-19: Secondary | ICD-10-CM

## 2019-07-12 DIAGNOSIS — F17200 Nicotine dependence, unspecified, uncomplicated: Secondary | ICD-10-CM | POA: Insufficient documentation

## 2019-07-12 DIAGNOSIS — R05 Cough: Secondary | ICD-10-CM | POA: Diagnosis not present

## 2019-07-12 LAB — SARS CORONAVIRUS 2 (TAT 6-24 HRS): SARS Coronavirus 2: NEGATIVE

## 2019-07-12 MED ORDER — PREDNISONE 10 MG (21) PO TBPK: ORAL_TABLET | ORAL | 0 refills | Status: DC

## 2019-07-12 NOTE — Discharge Instructions (Signed)
Follow-up with your regular doctor if not improving in 3 days.  Return emergency department for worsening.  Your Covid test results should return and 6 to 24 hours.  A nurse will call you with your results.  If negative you may return to work next day.  If positive you must remain quarantined for an additional 10 days.

## 2019-07-12 NOTE — ED Provider Notes (Signed)
[facility] Emergency Department Provider Note  ____________________________________________   First MD Initiated Contact with Patient 07/12/19 1011     (approximate)  I have reviewed the triage vital signs and the nursing notes.   HISTORY  Chief Complaint Nasal Congestion and Cough    HPI Bianca Acosta is a 43 y.o. female presents emergency department complaining of cough and some difficulty breathing after the cough.  Symptoms started yesterday.  No fever or chills.  Mucus is clear.  No over-the-counter medications.  No known exposure to Covid.  States she did not go to a large family gathering for Thanksgiving.  She denies chest pain or shortness of breath at this time.  She does retain her sense of taste and smell    Past Medical History:  Diagnosis Date  . Brain aneurysm 06/29/2015  . Chicken pox   . Headache   . Hypertension   . Sleep apnea 10/2018    Patient Active Problem List   Diagnosis Date Noted  . OSA (obstructive sleep apnea) 12/25/2018  . Intramural and subserous leiomyoma of uterus 10/25/2018  . Hives 07/23/2016  . Right knee pain 05/18/2016  . Morbid obesity with BMI of 40.0-44.9, adult ([place]) 04/23/2016  . HTN (hypertension) 04/23/2016  . Encounter for preventative adult health care exam with abnormal findings 04/23/2016  . 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 CRANIOTOMY Right 06/29/2015   Procedure: CRANIOTOMY INTRACRANIAL ANEURYSM CLIPPING;  Surgeon: Kevan Ny Ditty, MD;  Location: [facility];  Service: Neurosurgery;  Laterality: Right;  . IR GENERIC HISTORICAL  05/25/2016   IR ANGIO INTRA EXTRACRAN SEL INTERNAL CAROTID BILAT MOD SED 05/25/2016 Consuella Lose, MD [facility]  . IR GENERIC HISTORICAL  06/29/2015   IR ANGIO INTRA EXTRACRAN SEL INTERNAL CAROTID BILAT MOD SED 06/29/2015 Consuella Lose, MD [facility]  . IR GENERIC HISTORICAL  06/29/2015   IR ANGIO VERTEBRAL SEL VERTEBRAL UNI L MOD SED 06/29/2015 Consuella Lose, MD [facility]  . IR GENERIC HISTORICAL  06/29/2015   IR 3D INDEPENDENT WKST 06/29/2015 Consuella Lose, MD [facility]    Prior to Admission medications   Medication Sig Start Date End Date Taking? Authorizing Provider  albuterol (PROVENTIL HFA;VENTOLIN HFA) 108 (90 Base) MCG/ACT inhaler Inhale 2 puffs into the lungs every 6 (six) hours as needed for wheezing or shortness of breath. 08/29/18   Jodelle Green, FNP  carvedilol (COREG) 6.25 MG tablet Take 1 tablet (6.25 mg total) by mouth 2 (two) times daily. 01/09/19   Wellington Hampshire, MD  chlorthalidone (HYGROTON) 25 MG tablet Take 1 tablet (25 mg total) by mouth daily. 09/12/18 01/09/19  Wellington Hampshire, MD  cholecalciferol (VITAMIN D3) 25 MCG (1000 UT) tablet Take 1 tablet (1,000 Units total) by mouth daily. 09/26/18   Jodelle Green, FNP  predniSONE (STERAPRED UNI-PAK 21 TAB) 10 MG (21) TBPK tablet Take 6 pills on day one then decrease by 1 pill each day 07/12/19   Versie Starks, PA-C  Probiotic Product (FORTIFY PROBIOTIC WOMENS EX ST) CPDR Take 1 capsule by mouth daily. 09/17/18   Jodelle Green, FNP  spironolactone (ALDACTONE) 25 MG tablet Take 1 tablet (25 mg total) by mouth daily. 09/12/18 01/09/19  Wellington Hampshire, MD    Allergies Amoxicillin, Augmentin [amoxicillin-pot clavulanate], Ciprofloxacin, and Clavulanic acid  Family History  Problem Relation Age of Onset  .  Hypertension Mother   . Hypertension Father   . Hypertension Maternal Aunt   . Hypertension Maternal Uncle   . Hypertension Paternal Aunt   . Hypertension Paternal Uncle   . Hypertension Maternal Grandmother   . Hypertension Maternal Grandfather   . Hypertension Paternal Grandmother   . Hypertension Paternal Grandfather     Social History Social History   Tobacco Use  . Smoking status: Current Every Day Smoker     Packs/day: 0.50    Years: 22.00    Pack years: 11.00  . Smokeless tobacco: Never Used  . Tobacco comment: 1 pack lasts a day or two  Substance Use Topics  . Alcohol use: No  . Drug use: No    Review of Systems  Constitutional: No fever/chills Eyes: No visual changes. ENT: No sore throat. Respiratory: Positive cough Genitourinary: Negative for dysuria. Musculoskeletal: Negative for back pain. Skin: Negative for rash.    ____________________________________________   PHYSICAL EXAM:  VITAL SIGNS: ED Triage Vitals  Enc Vitals Group     BP 07/12/19 0957 (!) 135/118     Pulse Rate 07/12/19 0957 (!) 105     Resp 07/12/19 0957 18     Temp 07/12/19 0957 99 F (37.2 C)     Temp Source 07/12/19 0957 Oral     SpO2 07/12/19 0957 100 %     Weight 07/12/19 0957 250 lb (113.4 kg)     Height 07/12/19 0957 5\' 3"  (1.6 m)     Head Circumference --      Peak Flow --      Pain Score 07/12/19 0954 0     Pain Loc --      Pain Edu? --      Excl. in [place]? --     Constitutional: Alert and oriented. Well appearing and in no acute distress. Eyes: Conjunctivae are normal.  Head: Atraumatic. Nose: No congestion/rhinnorhea. Mouth/Throat: Mucous membranes are moist.   Neck:  supple no lymphadenopathy noted Cardiovascular: Normal rate, regular rhythm. Heart sounds are normal Respiratory: Normal respiratory effort.  No retractions, lungs c t a  GU: deferred Musculoskeletal: FROM all extremities, warm and well perfused Neurologic:  Normal speech and language.  Skin:  Skin is warm, dry and intact. No rash noted. Psychiatric: Mood and affect are normal. Speech and behavior are normal.  ____________________________________________   LABS (all labs ordered are listed, but only abnormal results are displayed)  Labs Reviewed  SARS CORONAVIRUS 2 (TAT 6-24 HRS)   ____________________________________________   ____________________________________________  RADIOLOGY  Chest x-ray is  negative  ____________________________________________   PROCEDURES  Procedure(s) performed: No  Procedures    ____________________________________________   INITIAL IMPRESSION / ASSESSMENT AND PLAN / ED COURSE  Pertinent labs & imaging results that were available during my care of the patient were reviewed by me and considered in my medical decision making (see chart for details).   Patient is 43 year old female presents emergency department with URI symptoms.  See HPI  Physical exam shows patient appears well.  Pulse is a little elevated at 105.  Blood pressure is a little elevated at 135/118.  Patient is afebrile at this time.  Lungs are clear to auscultation.  Chest x-ray Covid swab  ----------------------------------------- 11:18 AM on 07/12/2019 -----------------------------------------  Chest x-ray is negative.  Explained test results to the patient.  Had a long discussion about Covid and OTC treatments.  She is to obtain Mucinex to help thin mucus.  Sterapred if needed if she begins to  have wheezing.  Return to the emergency department if worsening.  Follow with regular doctor if not better in 3 days.  States she understands will comply.  Is discharged stable condition.    Bianca Acosta was evaluated in Emergency Department on 07/12/2019 for the symptoms described in the history of present illness. She was evaluated in the context of the global COVID-19 pandemic, which necessitated consideration that the patient might be at risk for infection with the SARS-CoV-2 virus that causes COVID-19. Institutional protocols and algorithms that pertain to the evaluation of patients at risk for COVID-19 are in a state of rapid change based on information released by regulatory bodies including the CDC and federal and state organizations. These policies and algorithms were followed during the patient's care in the ED.   As part of my medical decision making, I reviewed the  following data within the [facility] notes reviewed and incorporated, Old chart reviewed, Radiograph reviewed chest x-ray is negative, Notes from prior ED visits and [place] Controlled Substance Database  ____________________________________________   FINAL CLINICAL IMPRESSION(S) / ED DIAGNOSES  Final diagnoses:  Suspected COVID-19 virus infection  Mild intermittent asthma without complication      NEW MEDICATIONS STARTED DURING THIS VISIT:  New Prescriptions   PREDNISONE (STERAPRED UNI-PAK 21 TAB) 10 MG (21) TBPK TABLET    Take 6 pills on day one then decrease by 1 pill each day     Note:  This document was prepared using Dragon voice recognition software and may include unintentional dictation errors.    Versie Starks, PA-C 07/12/19 1118    Vanessa [place], MD 07/13/19 [phone]

## 2019-07-12 NOTE — ED Triage Notes (Signed)
Pt presents to ED via POV with c/o cough and nasal congestion that started that last night. Pt states last night while laying in bed she said the mucous was getting caught and she got scared. Pt ambulatory without difficulty, able to speak in full and complete sentences without difficulty, respirations even and unlabored.

## 2019-07-12 NOTE — ED Notes (Signed)

## 2019-07-16 ENCOUNTER — Ambulatory Visit (INDEPENDENT_AMBULATORY_CARE_PROVIDER_SITE_OTHER): Payer: BC Managed Care – PPO | Admitting: Internal Medicine

## 2019-07-16 ENCOUNTER — Other Ambulatory Visit: Payer: Self-pay

## 2019-07-16 ENCOUNTER — Encounter: Payer: Self-pay | Admitting: Internal Medicine

## 2019-07-16 ENCOUNTER — Telehealth: Payer: Self-pay | Admitting: Internal Medicine

## 2019-07-16 DIAGNOSIS — J069 Acute upper respiratory infection, unspecified: Secondary | ICD-10-CM | POA: Diagnosis not present

## 2019-07-16 NOTE — Progress Notes (Signed)
Patient ID: Bianca Acosta, female   DOB: [birth year], 43 y.o.   MRN: [id number]   Virtual Visit via video Note  This visit type was conducted due to national recommendations for restrictions regarding the COVID-19 pandemic (e.g. social distancing).  This format is felt to be most appropriate for this patient at this time.  All issues noted in this document were discussed and addressed.  No physical exam was performed (except for noted visual exam findings with Video Visits).   I connected with Donney Rankins by a video enabled telemedicine application and verified that I am speaking with the correct person using two identifiers. Location patient: home Location provider: work Persons participating in the virtual visit: patient, provider  I discussed the limitations, risks, security and privacy concerns of performing an evaluation and management service by video and the availability of in person appointments.  The patient expressed understanding and agreed to proceed.   Reason for visit: acute visit  HPI: Presented to ER 07/12/19 with cough and difficulty breathing.  CXR negative.  covid negative.  Was given steroid taper and instructed to take mucinex.  She reports she is feeling better.  Still with itchy throat, nasal congestion and drainage.  Cough is worse at night.  No fever.  Eating.  No loss of taste or smell. No vomiting.  No diarrhea.     ROS: See pertinent positives and negatives per HPI.  Past Medical History:  Diagnosis Date  . Brain aneurysm 06/29/2015  . Chicken pox   . Headache   . Hypertension   . Sleep apnea 10/2018    Past Surgical History:  Procedure Laterality Date  . CRANIOTOMY Right 06/29/2015   Procedure: CRANIOTOMY INTRACRANIAL ANEURYSM CLIPPING;  Surgeon: Kevan Ny Ditty, MD;  Location: [facility];  Service: Neurosurgery;  Laterality: Right;  . IR GENERIC HISTORICAL  05/25/2016   IR ANGIO INTRA EXTRACRAN SEL INTERNAL CAROTID BILAT MOD SED  05/25/2016 Consuella Lose, MD [facility]  . IR GENERIC HISTORICAL  06/29/2015   IR ANGIO INTRA EXTRACRAN SEL INTERNAL CAROTID BILAT MOD SED 06/29/2015 Consuella Lose, MD [facility]  . IR GENERIC HISTORICAL  06/29/2015   IR ANGIO VERTEBRAL SEL VERTEBRAL UNI L MOD SED 06/29/2015 Consuella Lose, MD [facility]  . IR GENERIC HISTORICAL  06/29/2015   IR 3D INDEPENDENT WKST 06/29/2015 Consuella Lose, MD [facility]    Family History  Problem Relation Age of Onset  . Hypertension Mother   . Hypertension Father   . Hypertension Maternal Aunt   . Hypertension Maternal Uncle   . Hypertension Paternal Aunt   . Hypertension Paternal Uncle   . Hypertension Maternal Grandmother   . Hypertension Maternal Grandfather   . Hypertension Paternal Grandmother   . Hypertension Paternal Grandfather     SOCIAL HX: reviewed.    Current Outpatient Medications:  .  albuterol (PROVENTIL HFA;VENTOLIN HFA) 108 (90 Base) MCG/ACT inhaler, Inhale 2 puffs into the lungs every 6 (six) hours as needed for wheezing or shortness of breath., Disp: 1 Inhaler, Rfl: 0 .  carvedilol (COREG) 6.25 MG tablet, Take 1 tablet (6.25 mg total) by mouth 2 (two) times daily., Disp: 60 tablet, Rfl: 6 .  chlorthalidone (HYGROTON) 25 MG tablet, Take 1 tablet (25 mg total) by mouth daily., Disp: 90 tablet, Rfl: 3 .  cholecalciferol (VITAMIN D3) 25 MCG (1000 UT) tablet, Take 1 tablet (1,000 Units total) by mouth daily., Disp: 90 tablet, Rfl: 3 .  predniSONE (STERAPRED UNI-PAK 21 TAB) 10 MG (  21) TBPK tablet, Take 6 pills on day one then decrease by 1 pill each day, Disp: 21 tablet, Rfl: 0 .  Probiotic Product (FORTIFY PROBIOTIC WOMENS EX ST) CPDR, Take 1 capsule by mouth daily., Disp: 30 capsule, Rfl: 2 .  spironolactone (ALDACTONE) 25 MG tablet, Take 1 tablet (25 mg total) by mouth daily., Disp: 90 tablet, Rfl: 3  EXAM:  VITALS per patient if applicable:  GENERAL: alert, oriented, appears well and in no acute  distress  HEENT: atraumatic, conjunttiva clear, no obvious abnormalities on inspection of external nose and ears  NECK: normal movements of the head and neck  LUNGS: on inspection no signs of respiratory distress, breathing rate appears normal, no obvious gross SOB, gasping or wheezing  CV: no obvious cyanosis  MS: moves all visible extremities without noticeable abnormality  PSYCH/NEURO: pleasant and cooperative, no obvious depression or anxiety, speech and thought processing grossly intact  ASSESSMENT AND PLAN:  Discussed the following assessment and plan:  URI (upper respiratory infection) Recent cough/congestion.  Evaluated in ER.  Given steroid taper.  Has albuterol inhaler if needed.  Symptoms have improved.  No fever.  Previous covid test negative.  Discussed continued quarantine.  Continue mucinex and steroid taper.  nasacort nasal spray as directed.  Follow.  Call with update.      I discussed the assessment and treatment plan with the patient. The patient was provided an opportunity to ask questions and all were answered. The patient agreed with the plan and demonstrated an understanding of the instructions.   The patient was advised to call back or seek an in-person evaluation if the symptoms worsen or if the condition fails to improve as anticipated.  I provided 25 minutes of non-face-to-face time during this encounter.   Einar Pheasant, MD

## 2019-07-16 NOTE — Telephone Encounter (Signed)
Message sent to pt for update.

## 2019-07-19 ENCOUNTER — Encounter: Payer: Self-pay | Admitting: Internal Medicine

## 2019-07-19 DIAGNOSIS — J069 Acute upper respiratory infection, unspecified: Secondary | ICD-10-CM | POA: Insufficient documentation

## 2019-07-19 NOTE — Assessment & Plan Note (Signed)
Recent cough/congestion.  Evaluated in ER.  Given steroid taper.  Has albuterol inhaler if needed.  Symptoms have improved.  No fever.  Previous covid test negative.  Discussed continued quarantine.  Continue mucinex and steroid taper.  nasacort nasal spray as directed.  Follow.  Call with update.

## 2019-08-17 ENCOUNTER — Telehealth: Payer: Self-pay | Admitting: Cardiovascular Disease

## 2019-08-17 NOTE — Telephone Encounter (Signed)
Patient states when she walks upstairs her heart beats "abnormal". Denies any SOB, CP. States it feels like her heart is "beating out quickly". States when she does not do activity, she does not have this feeling. Patient states she is at work and may not be able to pick up.

## 2019-08-17 NOTE — Telephone Encounter (Signed)
Call to patient to discuss sx. She reports pounding in chest when doing activity. Denies SOB or chest pain, no issues at rest. HR in 90s, last bp 128/60.  Pt feels it may be related to addition of coreg at last OV in may. She reports hx of aneurysm and worried. She also reports death of mother a few months ago has added stress.   Denies inc of caffeine.   Given patients complex hx and questions, I suggested it may be best to come in for OV> She agreed. appt made based on pt schedule for next available with Elenor Quinones, PA.   Pt appreciative of call and encouraged to call back if she has any new or worsening sx.

## 2019-08-27 ENCOUNTER — Ambulatory Visit (INDEPENDENT_AMBULATORY_CARE_PROVIDER_SITE_OTHER): Payer: BC Managed Care – PPO | Admitting: Physician Assistant

## 2019-08-27 ENCOUNTER — Telehealth: Payer: Self-pay | Admitting: Cardiovascular Disease

## 2019-08-27 ENCOUNTER — Other Ambulatory Visit: Payer: Self-pay

## 2019-08-27 ENCOUNTER — Encounter: Payer: Self-pay | Admitting: Physician Assistant

## 2019-08-27 ENCOUNTER — Ambulatory Visit (INDEPENDENT_AMBULATORY_CARE_PROVIDER_SITE_OTHER): Payer: BC Managed Care – PPO

## 2019-08-27 VITALS — BP 150/94 | HR 84 | Ht 63.0 in | Wt 246.0 lb

## 2019-08-27 DIAGNOSIS — I10 Essential (primary) hypertension: Secondary | ICD-10-CM | POA: Diagnosis not present

## 2019-08-27 DIAGNOSIS — Z720 Tobacco use: Secondary | ICD-10-CM

## 2019-08-27 DIAGNOSIS — E876 Hypokalemia: Secondary | ICD-10-CM | POA: Diagnosis not present

## 2019-08-27 DIAGNOSIS — R0602 Shortness of breath: Secondary | ICD-10-CM

## 2019-08-27 DIAGNOSIS — R0600 Dyspnea, unspecified: Secondary | ICD-10-CM

## 2019-08-27 DIAGNOSIS — G4733 Obstructive sleep apnea (adult) (pediatric): Secondary | ICD-10-CM

## 2019-08-27 DIAGNOSIS — R002 Palpitations: Secondary | ICD-10-CM

## 2019-08-27 DIAGNOSIS — R0609 Other forms of dyspnea: Secondary | ICD-10-CM

## 2019-08-27 DIAGNOSIS — I1A0 Resistant hypertension: Secondary | ICD-10-CM

## 2019-08-27 DIAGNOSIS — R0789 Other chest pain: Secondary | ICD-10-CM

## 2019-08-27 MED ORDER — CARVEDILOL 12.5 MG PO TABS: 12.5000 mg | ORAL_TABLET | Freq: Two times a day (BID) | ORAL | 3 refills | Status: DC

## 2019-08-27 NOTE — Telephone Encounter (Signed)
Call patient when ct schedule to do fu in office.

## 2019-08-27 NOTE — Patient Instructions (Signed)
Medication Instructions:  Your physician has recommended you make the following change in your medication:  1. INCREASE Carvedilol to 12.5 mg twice a day  *If you need a refill on your cardiac medications before your next appointment, please call your pharmacy*  Lab Work: Bmet to be done today   If you have labs (blood work) drawn today and your tests are completely normal, you will receive your results only by: Marland Kitchen MyChart Message (if you have MyChart) OR . A paper copy in the mail If you have any lab test that is abnormal or we need to change your treatment, we will call you to review the results.  Testing/Procedures: We will order CT coronary calcium score $150   Please call [phone] to schedule     [facility]  [address], [place] [id number]  Follow-Up: At [facility], you and your health needs are our priority.  As part of our continuing mission to provide you with exceptional heart care, we have created designated Provider Care Teams.  These Care Teams include your primary Cardiologist (physician) and Advanced Practice Providers (APPs -  Physician Assistants and Nurse Practitioners) who all work together to provide you with the care you need, when you need it.  Your next appointment:   After all of your testing has been done  The format for your next appointment:   In Person  Provider:    You may see Kathlyn Sacramento, MD or one of the following Advanced Practice Providers on your designated Care Team:    Murray Hodgkins, NP  Christell Faith, PA-C  Marrianne Mood, PA-C     How to Take Your Blood Pressure You can take your blood pressure at home with a machine. You may need to check your blood pressure at home:  To check if you have high blood pressure (hypertension).  To check your blood pressure over time.  To make sure your blood pressure medicine is working. Supplies needed: You will need a blood pressure machine, or monitor. You  can buy one at a drugstore or online. When choosing one:  Choose one with an arm cuff.  Choose one that wraps around your upper arm. Only one finger should fit between your arm and the cuff.  Do not choose one that measures your blood pressure from your wrist or finger. Your doctor can suggest a monitor. How to prepare Avoid these things for 30 minutes before checking your blood pressure:  Drinking caffeine.  Drinking alcohol.  Eating.  Smoking.  Exercising. Five minutes before checking your blood pressure:  Pee.  Sit in a dining chair. Avoid sitting in a soft couch or armchair.  Be quiet. Do not talk. How to take your blood pressure Follow the instructions that came with your machine. If you have a digital blood pressure monitor, these may be the instructions: 1. Sit up straight. 2. Place your feet on the floor. Do not cross your ankles or legs. 3. Rest your left arm at the level of your heart. You may rest it on a table, desk, or chair. 4. Pull up your shirt sleeve. 5. Wrap the blood pressure cuff around the upper part of your left arm. The cuff should be 1 inch (2.5 cm) above your elbow. It is best to wrap the cuff around bare skin. 6. Fit the cuff snugly around your arm. You should be able to place only one finger between the cuff and your arm. 7. Put  the cord inside the groove of your elbow. 8. Press the power button. 9. Sit quietly while the cuff fills with air and loses air. 10. Write down the numbers on the screen. 11. Wait 2-3 minutes and then repeat steps 1-10. What do the numbers mean? Two numbers make up your blood pressure. The first number is called systolic pressure. The second is called diastolic pressure. An example of a blood pressure reading is "120 over 80" (or 120/80). If you are an adult and do not have a medical condition, use this guide to find out if your blood pressure is normal: Normal  First number: below 120.  Second number: below  80. Elevated  First number: 120-129.  Second number: below 80. Hypertension stage 1  First number: 130-139.  Second number: 80-89. Hypertension stage 2  First number: 140 or above.  Second number: 75 or above. Your blood pressure is above normal even if only the top or bottom number is above normal. Follow these instructions at home:  Check your blood pressure as often as your doctor tells you to.  Take your monitor to your next doctor's appointment. Your doctor will: ? Make sure you are using it correctly. ? Make sure it is working right.  Make sure you understand what your blood pressure numbers should be.  Tell your doctor if your medicines are causing side effects. Contact a doctor if:  Your blood pressure keeps being high. Get help right away if:  Your first blood pressure number is higher than 180.  Your second blood pressure number is higher than 120. This information is not intended to replace advice given to you by your health care provider. Make sure you discuss any questions you have with your health care provider. Document Revised: 07/12/2017 Document Reviewed: 01/06/2016 Elsevier Patient Education  2020 [employer].  Steps to Quit Smoking Smoking tobacco is the leading cause of preventable death. It can affect almost every organ in the body. Smoking puts you and people around you at risk for many serious, long-lasting (chronic) diseases. Quitting smoking can be hard, but it is one of the best things that you can do for your health. It is never too late to quit. How do I get ready to quit? When you decide to quit smoking, make a plan to help you succeed. Before you quit:  Pick a date to quit. Set a date within the next 2 weeks to give you time to prepare.  Write down the reasons why you are quitting. Keep this list in places where you will see it often.  Tell your family, friends, and co-workers that you are quitting. Their support is important.  Talk  with your doctor about the choices that may help you quit.  Find out if your health insurance will pay for these treatments.  Know the people, places, things, and activities that make you want to smoke (triggers). Avoid them. What first steps can I take to quit smoking?  Throw away all cigarettes at home, at work, and in your car.  Throw away the things that you use when you smoke, such as ashtrays and lighters.  Clean your car. Make sure to empty the ashtray.  Clean your home, including curtains and carpets. What can I do to help me quit smoking? Talk with your doctor about taking medicines and seeing a counselor at the same time. You are more likely to succeed when you do both.  If you are pregnant or breastfeeding, talk with  your doctor about counseling or other ways to quit smoking. Do not take medicine to help you quit smoking unless your doctor tells you to do so. To quit smoking: Quit right away  Quit smoking totally, instead of slowly cutting back on how much you smoke over a period of time.  Go to counseling. You are more likely to quit if you go to counseling sessions regularly. Take medicine You may take medicines to help you quit. Some medicines need a prescription, and some you can buy over-the-counter. Some medicines may contain a drug called nicotine to replace the nicotine in cigarettes. Medicines may:  Help you to stop having the desire to smoke (cravings).  Help to stop the problems that come when you stop smoking (withdrawal symptoms). Your doctor may ask you to use:  Nicotine patches, gum, or lozenges.  Nicotine inhalers or sprays.  Non-nicotine medicine that is taken by mouth. Find resources Find resources and other ways to help you quit smoking and remain smoke-free after you quit. These resources are most helpful when you use them often. They include:  Online chats with a Social worker.  Phone quitlines.  Printed Furniture conservator/restorer.  Support groups or  group counseling.  Text messaging programs.  Mobile phone apps. Use apps on your mobile phone or tablet that can help you stick to your quit plan. There are many free apps for mobile phones and tablets as well as websites. Examples include Quit Guide from the [employer] and smokefree.gov  What things can I do to make it easier to quit?   Talk to your family and friends. Ask them to support and encourage you.  Call a phone quitline (1-800-QUIT-NOW), reach out to support groups, or work with a Social worker.  Ask people who smoke to not smoke around you.  Avoid places that make you want to smoke, such as: ? Bars. ? Parties. ? Smoke-break areas at work.  Spend time with people who do not smoke.  Lower the stress in your life. Stress can make you want to smoke. Try these things to help your stress: ? Getting regular exercise. ? Doing deep-breathing exercises. ? Doing yoga. ? Meditating. ? Doing a body scan. To do this, close your eyes, focus on one area of your body at a time from head to toe. Notice which parts of your body are tense. Try to relax the muscles in those areas. How will I feel when I quit smoking? Day 1 to 3 weeks Within the first 24 hours, you may start to have some problems that come from quitting tobacco. These problems are very bad 2-3 days after you quit, but they do not often last for more than 2-3 weeks. You may get these symptoms:  Mood swings.  Feeling restless, nervous, angry, or annoyed.  Trouble concentrating.  Dizziness.  Strong desire for high-sugar foods and nicotine.  Weight gain.  Trouble pooping (constipation).  Feeling like you may vomit (nausea).  Coughing or a sore throat.  Changes in how the medicines that you take for other issues work in your body.  Depression.  Trouble sleeping (insomnia). Week 3 and afterward After the first 2-3 weeks of quitting, you may start to notice more positive results, such as:  Better sense of smell and  taste.  Less coughing and sore throat.  Slower heart rate.  Lower blood pressure.  Clearer skin.  Better breathing.  Fewer sick days. Quitting smoking can be hard. Do not give up if you fail the first  time. Some people need to try a few times before they succeed. Do your best to stick to your quit plan, and talk with your doctor if you have any questions or concerns. Summary  Smoking tobacco is the leading cause of preventable death. Quitting smoking can be hard, but it is one of the best things that you can do for your health.  When you decide to quit smoking, make a plan to help you succeed.  Quit smoking right away, not slowly over a period of time.  When you start quitting, seek help from your doctor, family, or friends. This information is not intended to replace advice given to you by your health care provider. Make sure you discuss any questions you have with your health care provider. Document Revised: 04/24/2019 Document Reviewed: 10/18/2018 Elsevier Patient Education  [place].

## 2019-08-27 NOTE — Progress Notes (Signed)
Office Visit    Patient Name: Bianca Acosta Date of Encounter: 08/27/2019  Primary Care Provider:  Jodelle Green, FNP Primary Cardiologist:  Kathlyn Sacramento, MD  Chief Complaint    44 year old female with history of hypertension, tobacco use, obesity, sleep apnea, brain aneurysm, and seen today for follow-up of resistant hypertension.  Past Medical History    Past Medical History:  Diagnosis Date  . Brain aneurysm 06/29/2015  . Chicken pox   . Headache   . Hypertension   . Sleep apnea 10/2018   Past Surgical History:  Procedure Laterality Date  . CRANIOTOMY Right 06/29/2015   Procedure: CRANIOTOMY INTRACRANIAL ANEURYSM CLIPPING;  Surgeon: Kevan Ny Ditty, MD;  Location: [facility];  Service: Neurosurgery;  Laterality: Right;  . IR GENERIC HISTORICAL  05/25/2016   IR ANGIO INTRA EXTRACRAN SEL INTERNAL CAROTID BILAT MOD SED 05/25/2016 Consuella Lose, MD [facility]  . IR GENERIC HISTORICAL  06/29/2015   IR ANGIO INTRA EXTRACRAN SEL INTERNAL CAROTID BILAT MOD SED 06/29/2015 Consuella Lose, MD [facility]  . IR GENERIC HISTORICAL  06/29/2015   IR ANGIO VERTEBRAL SEL VERTEBRAL UNI L MOD SED 06/29/2015 Consuella Lose, MD [facility]  . IR GENERIC HISTORICAL  06/29/2015   IR 3D INDEPENDENT WKST 06/29/2015 Consuella Lose, MD [facility]    Allergies  Allergies  Allergen Reactions  . Amoxicillin   . Augmentin [Amoxicillin-Pot Clavulanate] Hives  . Ciprofloxacin Itching    Patient states she starts itching from inside or internal.  . Clavulanic Acid     History of Present Illness    44 year old female with history of resistant hypertension, tobacco use, obesity, sleep apnea, and brain aneurysm.  She reports prolonged history of difficult to control hypertension, which started in her early 90s.  She also has a strong known family history of hypertension.  She was seen in January 2020, at which time she was on Bystolic and triamterene  hydrochlorothiazide.  She did not tolerate amlodipine well, as it made her feel sick on her stomach.  She was seen by her primary cardiologist and reported taking Aleve for headache and menstrual pain and was advised to decrease NSAID use.  She reportedly had fibroids with need for surgery in the near future. She reported DOE but denied CP or SOB at rest.  She was suspected of having sleep apnea and was referred for evaluation with subsequent diagnosis of sleep apnea but having a difficult time adjusting to her CPAP.  She was switched from hydrochlorothiazide to chlorthalidone with addition of spironolactone 50 mg once daily.  Her EKG was noted to be abnormal with T wave changes, suggestive of ischemia and prolonged QT interval.  She underwent echo which showed normal LVSF without significant valvular abnormalities.  There was moderate LVH.    Since that time, she reports that she has been aware of her heart several times.  Specifically, she notes sometimes feeling her heart beat when she lays down to rest.  She also notes dyspnea on exertion when climbing stairs, as well as associated "chest soreness," both of which resolved completely within a few minutes of rest.  She notes that she is very aware of her heart when she leans forward to pick something up off the ground.  No SOB or CP at rest. She continues to struggle with wearing her CPAP, as she does not normally sleep on her back.  She frequently wakes up and notices that she has taken the CPAP off in her  sleep.  At times, she has noted racing heart rate and palpitations. She reports a recent prescription for potassium but did not take these pills, as they were difficult for her to swallow.  She does not feel short of breath at rest.  She discovered that her father has heart problems; however, he has remained somewhat secretive regarding exactly what heart problems those are which has resulted in some anxiety regarding her cardiovascular health.  She states  today that she would like to run as many tests as possible to rule out any possibility of heart disease before proceeding with working out. No presyncope or syncope. No s/sx consistent with bleeding. She is currently smoking 6-7 cigarettes per day.   Home Medications    Prior to Admission medications   Medication Sig Start Date End Date Taking? Authorizing Provider  albuterol (PROVENTIL HFA;VENTOLIN HFA) 108 (90 Base) MCG/ACT inhaler Inhale 2 puffs into the lungs every 6 (six) hours as needed for wheezing or shortness of breath. 08/29/18  Yes Guse, Jacquelynn Cree, FNP  carvedilol (COREG) 6.25 MG tablet Take 1 tablet (6.25 mg total) by mouth 2 (two) times daily. 01/09/19  Yes Wellington Hampshire, MD  chlorthalidone (HYGROTON) 25 MG tablet Take 1 tablet (25 mg total) by mouth daily. 09/12/18 08/27/19 Yes Wellington Hampshire, MD  cholecalciferol (VITAMIN D3) 25 MCG (1000 UT) tablet Take 1 tablet (1,000 Units total) by mouth daily. 09/26/18  Yes Guse, Jacquelynn Cree, FNP  Probiotic Product (FORTIFY PROBIOTIC WOMENS EX ST) CPDR Take 1 capsule by mouth daily. 09/17/18  Yes Guse, Jacquelynn Cree, FNP  spironolactone (ALDACTONE) 25 MG tablet Take 1 tablet (25 mg total) by mouth daily. 09/12/18 08/27/19 Yes Wellington Hampshire, MD    Review of Systems    She denies pnd, orthopnea, n, v, dizziness, syncope, edema, weight gain, or early satiety. She reports chest discomfort with exertion, racing HR, palpitations, and DOE.  All other systems reviewed and are otherwise negative except as noted above.  Physical Exam    VS:  BP (!) 150/94 (BP Location: Left Arm, Patient Position: Sitting, Cuff Size: Normal)   Pulse 84   Ht 5\' 3"  (1.6 m)   Wt 246 lb (111.6 kg)   SpO2 100%   BMI 43.58 kg/m  , BMI Body mass index is 43.58 kg/m. GEN: Well nourished, well developed, in no acute distress. HEENT: normal. Neck: Supple, no JVD, carotid bruits, or masses.  Cardiac: RRR, no murmurs, rubs, or gallops. No clubbing, cyanosis, edema.  Radials/DP/PT  2+ and equal bilaterally.   Respiratory:  Respirations regular and unlabored, clear to auscultation bilaterally. GI: Soft, nontender, nondistended, BS + x 4. MS: no deformity or atrophy. Skin: warm and dry, no rash. Neuro:  Strength and sensation are intact. Psych: Normal affect.  Accessory Clinical Findings    ECG personally reviewed by me today - NSR, 84bpm, LVH with repolarization changes, nonspecific ST/T wave abnormality in anterolateral leads consistent with previous EKGs - no acute changes.  Filed Weights   08/27/19 1342  Weight: 246 lb (111.6 kg)     02/06/2019 Echo  1. The left ventricle has normal systolic function with an ejection fraction of 60-65%. The cavity size was normal. There is moderately increased left ventricular wall thickness. Left ventricular diastolic Doppler parameters are consistent with  pseudonormalization. No evidence of left ventricular regional wall motion abnormalities.  2. The right ventricle has normal systolic function. The cavity was normal. There is no increase in right ventricular wall thickness.  Right ventricular systolic pressure could not be assessed.  3. Left atrial size was mildly dilated.  4. The aortic valve is tricuspid.  5. The aortic root is normal in size and structure.  6. The interatrial septum was not well visualized.  10/06/2018 Sodium 140, potassium 3.0, creatinine 0.98, BUN 16, WBC 5.8, RBC 5.12, hemoglobin 13.7, hematocrit 42.0  Assessment & Plan    Essential HTN --BP still suboptimal with patient reporting DOE and CP. Family and personal history of refractory hypertension. We reviewed the BP guidelines today. Given her continued suboptimal BP with room in HR, will increase Coreg to 12.5mg  BID. Discussed with patient that this increase is for further BP control, antianginal effect, as well as due to her report of palpitations. Discussed that side effects of the medication increased fatigue and SOB. She will let us know if she  does not tolerate this increase. Continue chlorthalidone with recheck of BMET as below and due to labs showing hypokalemia  Continue spironolactone. Further recommendations pending BMET. Risk factor modification recommended. Discussed the importance of maintaining a low salt diet and increasing activity as tolerated, as well as smoking cessation. Also discussed the importance of CPAP use. If BP remains elevated at follow-up, could consider addition of ARB as renal function tolerates.  Racing HR, DOE, Palpitations --Racing HR and DOE when walking up a flight of stairs, followed by CP/soreness. She also notes palpitations. Discussed that her elevated BP may be playing a role in these symptoms with most recent echo as above. Given report of palpitations and racing HR, will order 2 week Zio XT. Also will increase Coreg to 12.5mg  BID for antianginal effect and to further control symptoms of palpitations and racing HR. EKG does show TW changes suggestive of ischemia with cardiac CT ordered today as below. Also discussed hypokalemia on recent 09/2018 labs and the effects of low potassium; will recheck BMET to ensure electrolytes at goal. Also recommend she increase activity as tolerated, as deconditioning likely playing a role in her symptoms. Encouraged her to increase activity as tolerated. Smoking cessation advised.   Hypokalemia --Recent labs showing hypokalemia but could not take potassium supplementation. Will recheck a BMET given report of racing HR and plapitations. Discussed with her the importance of maintaining her electrolytes at goal with patient understanding.  Atypical CP --Reports chest soreness when climbing up stairs. Discussed that this may be multifactorial in the setting of deconditioning and ongoing tobacco use. Will plan to get Zio monitor, given her report of racing HR and palpitations. In addition, she would like further workup to ensure it is safe for her to workout and given her recent  discovery of her father's heart dz; therefore, will order coronary CT today for further workup. As noted above, due to EKG ST/T changes and persistent symptoms as above, consider future ischemic workup in the future and pending coronary CT results.  Increase Coreg to 12.5mg  BID as above for more optimal BP control and anti-anginal effect. Smoking cessation stressed.  OSA --CPAP use benefits discussed as well as long term health effects of untreated sleep apnea.  Current tobacco use --Recommend smoking cessation and reviewed possible ways to work towards complete cessation.   Disposition: Obtain BMET, 2 week Zio XT, Coronary CT, increase Coreg to 12.5mg  BID, follow-up in~ 1 month. Smoking cessation.   Arvil Chaco, PA-C 08/27/2019, 1:52 PM

## 2019-08-28 LAB — BASIC METABOLIC PANEL
BUN/Creatinine Ratio: 14 (ref 9–23)
BUN: 14 mg/dL (ref 6–24)
CO2: 28 mmol/L (ref 20–29)
Calcium: 10.2 mg/dL (ref 8.7–10.2)
Chloride: 102 mmol/L (ref 96–106)
Creatinine, Ser: 1.01 mg/dL — ABNORMAL HIGH (ref 0.57–1.00)
GFR calc Af Amer: 79 mL/min/{1.73_m2} (ref >59)
GFR calc non Af Amer: 68 mL/min/{1.73_m2} (ref >59)
Glucose: 104 mg/dL — ABNORMAL HIGH (ref 65–99)
Potassium: 3.5 mmol/L (ref 3.5–5.2)
Sodium: 144 mmol/L (ref 134–144)

## 2019-09-01 ENCOUNTER — Other Ambulatory Visit: Payer: Self-pay | Admitting: *Deleted

## 2019-09-01 DIAGNOSIS — Z79899 Other long term (current) drug therapy: Secondary | ICD-10-CM

## 2019-09-01 DIAGNOSIS — E876 Hypokalemia: Secondary | ICD-10-CM

## 2019-09-01 MED ORDER — POTASSIUM CHLORIDE ER 10 MEQ PO TBCR: 10.0000 meq | EXTENDED_RELEASE_TABLET | Freq: Every day | ORAL | 0 refills | Status: DC

## 2019-09-01 NOTE — Telephone Encounter (Signed)
Patient has viewed the results and recommendations in MyChart from result note. KCL rx sent to pharmacy. No answer. Left message with results and recommendations, ok per DPR and to call back if any further questions.

## 2019-09-01 NOTE — Telephone Encounter (Signed)
No answer. Left message to call back.   

## 2019-09-17 ENCOUNTER — Inpatient Hospital Stay: Admission: RE | Admit: 2019-09-17 | Payer: BC Managed Care – PPO | Source: Ambulatory Visit

## 2019-09-28 ENCOUNTER — Other Ambulatory Visit: Payer: Self-pay

## 2019-09-28 ENCOUNTER — Ambulatory Visit (INDEPENDENT_AMBULATORY_CARE_PROVIDER_SITE_OTHER)
Admission: RE | Admit: 2019-09-28 | Discharge: 2019-09-28 | Disposition: A | Discharge status: Home / Self Care | Payer: Self-pay | Source: Ambulatory Visit | Attending: Physician Assistant | Admitting: Physician Assistant

## 2019-09-28 DIAGNOSIS — R0609 Other forms of dyspnea: Secondary | ICD-10-CM

## 2019-09-28 DIAGNOSIS — R0600 Dyspnea, unspecified: Secondary | ICD-10-CM

## 2019-09-28 NOTE — Progress Notes (Deleted)
Office Visit    Patient Name: Bianca Acosta Date of Encounter: 09/28/2019  Primary Care Provider:  Jodelle Green, FNP Primary Cardiologist:  Kathlyn Sacramento, MD  Chief Complaint    44 year old female with history of hypertension, tobacco use, obesity, sleep apnea, brain aneurysm, and seen today for 1 month follow-up of resistant hypertension and CP.  Past Medical History    Past Medical History:  Diagnosis Date  . Brain aneurysm 06/29/2015  . Chicken pox   . Headache   . Hypertension   . Sleep apnea 10/2018   Past Surgical History:  Procedure Laterality Date  . CRANIOTOMY Right 06/29/2015   Procedure: CRANIOTOMY INTRACRANIAL ANEURYSM CLIPPING;  Surgeon: Kevan Ny Ditty, MD;  Location: [facility];  Service: Neurosurgery;  Laterality: Right;  . IR GENERIC HISTORICAL  05/25/2016   IR ANGIO INTRA EXTRACRAN SEL INTERNAL CAROTID BILAT MOD SED 05/25/2016 Consuella Lose, MD [facility]  . IR GENERIC HISTORICAL  06/29/2015   IR ANGIO INTRA EXTRACRAN SEL INTERNAL CAROTID BILAT MOD SED 06/29/2015 Consuella Lose, MD [facility]  . IR GENERIC HISTORICAL  06/29/2015   IR ANGIO VERTEBRAL SEL VERTEBRAL UNI L MOD SED 06/29/2015 Consuella Lose, MD [facility]  . IR GENERIC HISTORICAL  06/29/2015   IR 3D INDEPENDENT WKST 06/29/2015 Consuella Lose, MD [facility]    Allergies  Allergies  Allergen Reactions  . Amoxicillin   . Augmentin [Amoxicillin-Pot Clavulanate] Hives  . Ciprofloxacin Itching    Patient states she starts itching from inside or internal.  . Clavulanic Acid     History of Present Illness    44 year old female with history of resistant hypertension, tobacco use, obesity, sleep apnea, and brain aneurysm.  She reports prolonged history of difficult to control hypertension, which started in her early 54s.  She also has a strong known family history of hypertension.  She was seen in January 2020, at which time she was on Bystolic and  triamterene hydrochlorothiazide.  She did not tolerate amlodipine well, as it made her feel sick on her stomach.  She was seen by her primary cardiologist and reported taking Aleve for headache and menstrual pain and was advised to decrease NSAID use.  She reportedly had fibroids with need for surgery in the near future. She reported DOE but denied CP or SOB at rest.  She was suspected of having sleep apnea and was referred for evaluation with subsequent diagnosis of sleep apnea but having a difficult time adjusting to her CPAP.  She was switched from hydrochlorothiazide to chlorthalidone with addition of spironolactone 50 mg once daily.  Her EKG was noted to be abnormal with T wave changes, suggestive of ischemia and prolonged QT interval.  She underwent echo which showed normal LVSF without significant valvular abnormalities.  There was moderate LVH.    When last seen in clinic 08/27/19, she reported sometimes feeling her heart beat when she lays down to rest. At times, she has noted racing heart rate and palpitations. She had been prescribed KCl for hypokalemia but not taken these pills 2/2 the size.  She noted DOE  when climbing stairs, as well as associated "chest soreness," both of which resolved completely within a few minutes of rest.  She felt very aware of her heart when she leaned forward to pick something up off the ground. She struggled with her CPAP, as she does not normally sleep on her back, and often took it off in her sleep.  She reported anxiety, as  she had discovered that her father has heart problems but was unclear on the details. She was smoking 6-7 cigarettes per day.  BMET was obtained to check her potassium. Zio was ordered to rule out arrhytmia. Coronary CT was also ordered given her family history and CP.   Zio showed predominantly NSR with min HR 48bpm and max 171bpm, as well as an average HR of 84bpm. 1 run of NSVT lasting 4 beats with maximum rate of 158bpm. 4 SVT runs with fastest  lasting 8 beats and max 171bpm and longest 9.9 seconds with average rate 125bpm. It was noted that some episodes of SVT could be AT with variable block.    Home Medications    Prior to Admission medications   Medication Sig Start Date End Date Taking? Authorizing Provider  albuterol (PROVENTIL HFA;VENTOLIN HFA) 108 (90 Base) MCG/ACT inhaler Inhale 2 puffs into the lungs every 6 (six) hours as needed for wheezing or shortness of breath. 08/29/18   Jodelle Green, FNP  carvedilol (COREG) 12.5 MG tablet Take 1 tablet (12.5 mg total) by mouth 2 (two) times daily. 08/27/19 11/25/19  Marrianne Mood D, PA-C  chlorthalidone (HYGROTON) 25 MG tablet Take 1 tablet (25 mg total) by mouth daily. 09/12/18 08/27/19  Wellington Hampshire, MD  cholecalciferol (VITAMIN D3) 25 MCG (1000 UT) tablet Take 1 tablet (1,000 Units total) by mouth daily. 09/26/18   Guse, Jacquelynn Cree, FNP  potassium chloride (KLOR-CON) 10 MEQ tablet Take 1 tablet (10 mEq total) by mouth daily for 5 days. 09/01/19 09/06/19  Marrianne Mood D, PA-C  Probiotic Product (FORTIFY PROBIOTIC WOMENS EX ST) CPDR Take 1 capsule by mouth daily. 09/17/18   Jodelle Green, FNP  spironolactone (ALDACTONE) 25 MG tablet Take 1 tablet (25 mg total) by mouth daily. 09/12/18 08/27/19  Wellington Hampshire, MD    Review of Systems    ***.  All other systems reviewed and are otherwise negative except as noted above.  Physical Exam    VS:  LMP 09/14/2019  , BMI There is no height or weight on file to calculate BMI. GEN: Well nourished, well developed, in no acute distress. HEENT: normal. Neck: Supple, no JVD, carotid bruits, or masses. Cardiac: RRR, no murmurs, rubs, or gallops. No clubbing, cyanosis, edema.  Radials/DP/PT 2+ and equal bilaterally.  Respiratory:  Respirations regular and unlabored, clear to auscultation bilaterally. GI: Soft, nontender, nondistended, BS + x 4. MS: no deformity or atrophy. Skin: warm and dry, no rash. Neuro:  Strength and sensation are  intact. Psych: Normal affect.  Accessory Clinical Findings    ECG personally reviewed by me today - *** - no acute changes.  There were no vitals filed for this visit.   Assessment & Plan    1.  ***   Arvil Chaco, PA-C 09/28/2019, 2:14 PM

## 2019-09-30 ENCOUNTER — Telehealth: Payer: Self-pay | Admitting: Physician Assistant

## 2019-10-01 ENCOUNTER — Telehealth (INDEPENDENT_AMBULATORY_CARE_PROVIDER_SITE_OTHER): Payer: BC Managed Care – PPO | Admitting: Physician Assistant

## 2019-10-01 ENCOUNTER — Other Ambulatory Visit: Payer: Self-pay

## 2019-10-01 VITALS — BP 136/86 | HR 87 | Ht 63.0 in | Wt 247.0 lb

## 2019-10-01 DIAGNOSIS — I471 Supraventricular tachycardia, unspecified: Secondary | ICD-10-CM

## 2019-10-01 DIAGNOSIS — I10 Essential (primary) hypertension: Secondary | ICD-10-CM

## 2019-10-01 DIAGNOSIS — I493 Ventricular premature depolarization: Secondary | ICD-10-CM

## 2019-10-01 DIAGNOSIS — G4730 Sleep apnea, unspecified: Secondary | ICD-10-CM

## 2019-10-01 DIAGNOSIS — R0609 Other forms of dyspnea: Secondary | ICD-10-CM

## 2019-10-01 DIAGNOSIS — R0600 Dyspnea, unspecified: Secondary | ICD-10-CM

## 2019-10-01 DIAGNOSIS — Z720 Tobacco use: Secondary | ICD-10-CM

## 2019-10-01 DIAGNOSIS — E876 Hypokalemia: Secondary | ICD-10-CM

## 2019-10-01 DIAGNOSIS — R140 Abdominal distension (gaseous): Secondary | ICD-10-CM

## 2019-10-01 DIAGNOSIS — Z6841 Body Mass Index (BMI) 40.0 and over, adult: Secondary | ICD-10-CM

## 2019-10-01 DIAGNOSIS — I4719 Other supraventricular tachycardia: Secondary | ICD-10-CM

## 2019-10-01 DIAGNOSIS — Z79899 Other long term (current) drug therapy: Secondary | ICD-10-CM

## 2019-10-01 DIAGNOSIS — Z5320 Procedure and treatment not carried out because of patient's decision for unspecified reasons: Secondary | ICD-10-CM

## 2019-10-01 DIAGNOSIS — I1A0 Resistant hypertension: Secondary | ICD-10-CM

## 2019-10-01 DIAGNOSIS — I5043 Acute on chronic combined systolic (congestive) and diastolic (congestive) heart failure: Secondary | ICD-10-CM

## 2019-10-01 DIAGNOSIS — I491 Atrial premature depolarization: Secondary | ICD-10-CM

## 2019-10-01 MED ORDER — SPIRONOLACTONE 25 MG PO TABS: 25.0000 mg | ORAL_TABLET | Freq: Every day | ORAL | 3 refills | Status: DC

## 2019-10-01 MED ORDER — POTASSIUM CHLORIDE ER 10 MEQ PO TBCR: 10.0000 meq | EXTENDED_RELEASE_TABLET | Freq: Every day | ORAL | 0 refills | Status: DC

## 2019-10-01 MED ORDER — CHLORTHALIDONE 25 MG PO TABS: 25.0000 mg | ORAL_TABLET | Freq: Every day | ORAL | 3 refills | Status: DC

## 2019-10-01 NOTE — Telephone Encounter (Signed)
Call to patient to check in for virtual. I called patient to clarify that consent was received and she is agreeable.   Pt gave verbal consent for e visit via phone.   Vitals entered in visit navigator.

## 2019-10-01 NOTE — Patient Instructions (Signed)
Medication Instructions:  Your physician recommends that you continue on your current medications as directed. Please refer to the Current Medication list given to you today.  *If you need a refill on your cardiac medications before your next appointment, please call your pharmacy*  Lab Work: Your physician recommends that you return for lab work in: 1 week [facility] the medical mall. (BMET, Mag) No appt is needed. Hours are M-F 7AM- 6 PM.  If you have labs (blood work) drawn today and your tests are completely normal, you will receive your results only by: Marland Kitchen MyChart Message (if you have MyChart) OR . A paper copy in the mail If you have any lab test that is abnormal or we need to change your treatment, we will call you to review the results.  Testing/Procedures: 1- Echo  Please return to [facility] on ______________ at _______________ AM/PM for an Echocardiogram. Your physician has requested that you have an echocardiogram. Echocardiography is a painless test that uses sound waves to create images of your heart. It provides your doctor with information about the size and shape of your heart and how well your heart's chambers and valves are working. This procedure takes approximately one hour. There are no restrictions for this procedure. Please note; depending on visual quality an IV may need to be placed.   2- GI (stomach doctor) referral   Follow-Up: At [facility], you and your health needs are our priority.  As part of our continuing mission to provide you with exceptional heart care, we have created designated Provider Care Teams.  These Care Teams include your primary Cardiologist (physician) and Advanced Practice Providers (APPs -  Physician Assistants and Nurse Practitioners) who all work together to provide you with the care you need, when you need it.  Your next appointment:   3 month(s)  The format for your next appointment:   In Person  Provider:    You may see  Kathlyn Sacramento, MD or Elenor Quinones, PA

## 2019-10-01 NOTE — Progress Notes (Signed)
Virtual Visit via Telephone Note   This visit type was conducted due to national recommendations for restrictions regarding the COVID-19 Pandemic (e.g. social distancing) in an effort to limit this patient's exposure and mitigate transmission in our community.  Due to her co-morbid illnesses, this patient is at least at moderate risk for complications without adequate follow up.  This format is felt to be most appropriate for this patient at this time.  The patient did not have access to video technology/had technical difficulties with video requiring transitioning to audio format only (telephone).  All issues noted in this document were discussed and addressed.  No physical exam could be performed with this format.  Please refer to the patient's chart for her  consent to telehealth for [facility].   Date:  10/02/2019   ID:  Bianca Acosta, DOB [birth year], MRN [id number]  Patient Location: Home Provider Location: Home  PCP:  Jodelle Green, FNP  Cardiologist:  Kathlyn Sacramento, MD  Electrophysiologist:  None   Evaluation Performed:  Follow-Up Visit  Chief Complaint:  Resistant Hypertension  History of Present Illness:    Bianca Acosta is a 44 year old female with history of resistant hypertension, tobacco use, obesity, sleep apnea, and brain aneurysm.  She reports prolonged history of difficult to control hypertension, which started in her early 2s.  She also has a strong known family history of hypertension.  She was seen in January 2020, at which time she was on Bystolic and triamterene hydrochlorothiazide.  She did not tolerate amlodipine well, as it made her feel sick on her stomach.  She was seen by her primary cardiologist and reported taking Aleve for headache and menstrual pain and was advised to decrease NSAID use.  She reportedly had fibroids with need for surgery in the near future. She reported DOE but denied CP or SOB at rest.  She was suspected of having sleep apnea  and was referred for evaluation with subsequent diagnosis of sleep apnea but having a difficult time adjusting to her CPAP.  She was switched from hydrochlorothiazide to chlorthalidone with addition of spironolactone 50 mg once daily.  Her EKG was noted to be abnormal with T wave changes, suggestive of ischemia and prolonged QT interval.  She underwent echo which showed normal LVSF without significant valvular abnormalities.  There was moderate LVH.    At her 08/27/19 clinic visit, she reported that she has been aware of her heart several times.  Specifically, she noted sometimes feeling her heart beat when she lays down to rest.  She also noted dyspnea on exertion when climbing stairs, as well as associated "chest soreness," both of which resolved completely within a few minutes of rest.   No SOB or CP at rest. She struggled with wearing her CPAP, as she did not normally sleep on her back.  She frequently woke up and noted that she has taken the CPAP off in her sleep.  At times, she noted racing heart rate and palpitations. She was prescribed potassium but did not take these pills, as they were difficult for her to swallow. She reported anxiety regarding her cardiovascular health 2/2 her father's cardiac history, which she reported she was unclear on but knew he had heart problems. She was smoking 6-7 cigarettes per day. Zio was placed with results below and including SVT but could not rule out AT. CAC score ordered with CAC score 0. She was provided KCl supplementation but did not yet take it.   At  RTC today, she reports she still has not used her CPAP as she struggles with her mask. She is aware that she needs to stop smoking. Currently smoking 1 pk or less every two days (sometimes does not finish the cigarette). She usually has cigarettes with her 1/2 cup of coffee. She drinks alcohol when social once every few months and usually only on or two drinks. She is not doing much for activity as she has been on  vacation for the last couple of weeks. She reports no further sx as described at her 1/14 clinic, which she attributes to lower stress and activity on vacation. Also, since starting Coreg, she has not noticed her heart pounding or racing or anymore. She has not noted DOE but also has done anything active since we last spoke. She feels (in retrospect) that the pounding is worse with abdominal bloating and requests a GI referral. She does not drink soft drinks. No CP at rest, SOB, presyncope, syncope, orthopnea, PND, or early satiety. She does not have a regular exercise program. She reports a desire to manage her cardiac conditions today with natural or alternative medications and reduce pills this visit and instead try OTC supplements. She also reports her suspicion that she may need to see GI and OBGYN.  The patient does not have symptoms concerning for COVID-19 infection (fever, chills, cough, or new shortness of breath).    Past Medical History:  Diagnosis Date  . Brain aneurysm 06/29/2015  . Chicken pox   . Headache   . Hypertension   . Sleep apnea 10/2018   Past Surgical History:  Procedure Laterality Date  . CRANIOTOMY Right 06/29/2015   Procedure: CRANIOTOMY INTRACRANIAL ANEURYSM CLIPPING;  Surgeon: Kevan Ny Ditty, MD;  Location: [place] [facility];  Service: Neurosurgery;  Laterality: Right;  . IR GENERIC HISTORICAL  05/25/2016   IR ANGIO INTRA EXTRACRAN SEL INTERNAL CAROTID BILAT MOD SED 05/25/2016 Consuella Lose, MD [facility]  . IR GENERIC HISTORICAL  06/29/2015   IR ANGIO INTRA EXTRACRAN SEL INTERNAL CAROTID BILAT MOD SED 06/29/2015 Consuella Lose, MD [facility]  . IR GENERIC HISTORICAL  06/29/2015   IR ANGIO VERTEBRAL SEL VERTEBRAL UNI L MOD SED 06/29/2015 Consuella Lose, MD [facility]  . IR GENERIC HISTORICAL  06/29/2015   IR 3D INDEPENDENT WKST 06/29/2015 Consuella Lose, MD [facility]     Current Meds  Medication Sig  . albuterol (PROVENTIL  HFA;VENTOLIN HFA) 108 (90 Base) MCG/ACT inhaler Inhale 2 puffs into the lungs every 6 (six) hours as needed for wheezing or shortness of breath.  . carvedilol (COREG) 12.5 MG tablet Take 1 tablet (12.5 mg total) by mouth 2 (two) times daily.  . chlorthalidone (HYGROTON) 25 MG tablet Take 1 tablet (25 mg total) by mouth daily.  . cholecalciferol (VITAMIN D3) 25 MCG (1000 UT) tablet Take 1 tablet (1,000 Units total) by mouth daily.  . potassium chloride (KLOR-CON) 10 MEQ tablet Take 1 tablet (10 mEq total) by mouth daily for 5 days.  . Probiotic Product (FORTIFY PROBIOTIC WOMENS EX ST) CPDR Take 1 capsule by mouth daily.  Marland Kitchen spironolactone (ALDACTONE) 25 MG tablet Take 1 tablet (25 mg total) by mouth daily.  . [DISCONTINUED] chlorthalidone (HYGROTON) 25 MG tablet Take 1 tablet (25 mg total) by mouth daily.  . [DISCONTINUED] potassium chloride (KLOR-CON) 10 MEQ tablet Take 1 tablet (10 mEq total) by mouth daily for 5 days.  . [DISCONTINUED] spironolactone (ALDACTONE) 25 MG tablet Take 1 tablet (25 mg total) by  mouth daily.     Allergies:   Amoxicillin, Augmentin [amoxicillin-pot clavulanate], Ciprofloxacin, and Clavulanic acid   Social History   Tobacco Use  . Smoking status: Current Every Day Smoker    Packs/day: 0.50    Years: 22.00    Pack years: 11.00  . Smokeless tobacco: Never Used  . Tobacco comment: 1 pack lasts a day or two  Substance Use Topics  . Alcohol use: No  . Drug use: No     Family Hx: The patient's family history includes Hypertension in her father, maternal aunt, maternal grandfather, maternal grandmother, maternal uncle, mother, paternal aunt, paternal grandfather, paternal grandmother, and paternal uncle.  ROS:   Please see the history of present illness.    She denies chest pain, pnd, orthopnea, n, v, dizziness, syncope, edema, weight gain, or early satiety. She reports improved dyspnea and pounding heart beat with continued abdominal bloating / distention.  All  other systems reviewed and are negative.   Prior CV studies:   The following studies were reviewed today:  Echo 12/2018 FINDINGS  Left Ventricle: The left ventricle has normal systolic function, with an  ejection fraction of 60-65%. The cavity size was normal. There is  moderately increased left ventricular wall thickness. Left ventricular  diastolic Doppler parameters are consistent  with pseudonormalization. No evidence of left ventricular regional wall  motion abnormalities..  Right Ventricle: The right ventricle has normal systolic function. The  cavity was normal. There is no increase in right ventricular wall  thickness. Right ventricular systolic pressure could not be assessed.  Left Atrium: Left atrial size was mildly dilated.  Right Atrium: Right atrial size was normal in size. Right atrial pressure  is estimated at 3 mmHg.  Interatrial Septum: The interatrial septum was not well visualized.  Pericardium: There is no evidence of pericardial effusion.  Mitral Valve: The mitral valve is normal in structure. Mitral valve  regurgitation is trivial by color flow Doppler.  Tricuspid Valve: The tricuspid valve is normal in structure. Tricuspid  valve regurgitation was not visualized by color flow Doppler.  Aortic Valve: The aortic valve is tricuspid Aortic valve regurgitation was  not visualized by color flow Doppler. There is no evidence of aortic valve  stenosis.  Pulmonic Valve: The pulmonic valve was grossly normal. Pulmonic valve  regurgitation is not visualized by color flow Doppler. No evidence of  pulmonic stenosis.  Aorta: The aortic root is normal in size and structure.  Pulmonary Artery: The pulmonary artery is not well seen.  Venous: The inferior vena cava measures 1.50 cm, is normal in size with  greater than 50% respiratory variability.   Zio 08/27/19 Predominant underlying rhythm sinus rhythm with mean heart rate 48 bpm, max heart rate 171 bpm, average heart  rate 84 bpm.  1 run of VT with average rate 156 bpm (longest 4 beats, max rate 158 bpm).  4 SVT runs: Fastest 8 beats and max 171 bpm, longest 9.9 seconds and average 125 bpm.  Some SVT that might be atrial tachycardia with variable block.  SVT was detected within 45 seconds of patient triggered events.  Rare SVE's ((<1%), rare SVE couplets (<1%), rare SVE triplets(<1%).  Rare VE's(<1%).  No VE couplets or triplets.  Ventricular bigeminy was present.  Inverted QRS complexes, possibly due to inverted placement of device.  CT CAC Score 09/28/19 FINDINGS: Non-cardiac: See separate report from [facility]. Ascending Aorta: Normal caliber. Pericardium: Normal. Coronary arteries: Normal origins. IMPRESSION: Coronary calcium score of 0. Electronically  Signed   By: Eleonore Chiquito   On: 09/28/2019 19:11   Labs/Other Tests and Data Reviewed:    EKG:  No ECG reviewed.  Recent Labs: 10/06/2018: Hemoglobin 13.7; Platelets 230 10/10/2018: TSH 0.507 08/27/2019: BUN 14; Creatinine, Ser 1.01; Potassium 3.5; Sodium 144   Recent Lipid Panel Lab Results  Component Value Date/Time   CHOL 134 05/16/2018 09:27 AM   TRIG 81 05/16/2018 09:27 AM   HDL 49 (L) 05/16/2018 09:27 AM   CHOLHDL 2.7 05/16/2018 09:27 AM   LDLCALC 69 05/16/2018 09:27 AM    Wt Readings from Last 3 Encounters:  10/01/19 247 lb (112 kg)  08/27/19 246 lb (111.6 kg)  07/16/19 245 lb (111.1 kg)     Objective:    Vital Signs:  BP 136/86   Pulse 87   Ht 5\' 3"  (1.6 m)   Wt 247 lb (112 kg)   LMP 09/14/2019   BMI 43.75 kg/m    VITAL SIGNS:  reviewed  ASSESSMENT & PLAN:    Essential HTN --BP improved, as well as DOE and CP. Family and personal history of refractory hypertension. Given her continued suboptimal DBP with room in HR, recommended increase in Coreg with patient preference to defer. Continue chlorthalidone and spironolactone. Repeat BMET/Mg and stressed she should take her potassium. She is resistant to  taking these pills 2/2 size. Risk factor modification recommended. Discussed the importance of maintaining a low salt diet and increasing activity as tolerated, as well as smoking cessation. Also discussed the importance of CPAP use. If BP remains elevated at follow-up, revisit increase to Coreg +/- addition of ARB as renal function tolerates.  Racing HR, DOE, Palpitations Atrial Fibrillation / SVT --Has been on vacation and unsure if racing HR and DOE when walking up a flight of stairs. 2 week Zio XT performed as above with SVT and cannot rule out Afib. Discussed increase of Coreg +/- change to metoprolol given her SVT/AT. Discussed in detail. CHA2DS2VASc score of at least 3 (HFpEF, HTN, female). Long discussion regarding Afib and risk of stroke without anticoagulation with patient preference to avoid [place] and instead try alternative medicine with recommendation instead for [place]. Discussed repeating Zio to ensure Afib present; however, she requested an echo to look for atrial thrombus and reassess her atrial size and heart function to help assist with her decision regarding [place]. She knows she is still at risk of thrombotic event even if echo does not show current atrial thrombus. She also desires to come off of her Coreg today with recommendation to continue. Discussed hypokalemia and the effects of low potassium and encouraged her to take this supplementation with patient stating she will try. Will recheck BMET to ensure electrolytes at goal if she takes her KCl. CPAP recommended.    Hypokalemia --Recent labs showed hypokalemia but did not take potassium supplementation. Will recheck a BMET/Mg once she has taken her KCl. Discussed with her the importance of maintaining her electrolytes at goal with patient understanding.  Atypical CP --Cardiac CT ordered with CAC 0. Reports no further chest soreness when climbing up stairs; however, she is on vacation and has not been very active or stressed. Discussed  that this exertional CP may be multifactorial in the setting of deconditioning, SVT/AT, pulmonary HTN, ongoing tobacco use, as well as stress. Prefers to repeat echo and defer new medications.  Smoking cessation stressed. CPAP use stressed. Potassium supplementation stressed. Diet and lifestyle changes discussed. Increase activity as tolerated, as deconditioning likely playing a role  in her symptoms.   OSA --CPAP use benefits discussed in detail as well as long term health effects of untreated sleep apnea.  Current tobacco use --Recommend smoking cessation and reviewed possible ways to work towards complete cessation.   Abdominal distention --Given hypokalemia and per patient request, GI referral provided. She feels this may be causing her palpitations and we discussed that her SVT and Afib will likely persist, especially if untreated OSA and stops her medications. Discussed also that this abdominal bloating could be 2/2 volume given most recent echo results with patient preference to first see GI.    COVID-19 Education: The signs and symptoms of COVID-19 were discussed with the patient and how to seek care for testing (follow up with PCP or arrange E-visit).  The importance of social distancing was discussed today.  ----- Disposition: Obtain BMET/Mg in 1 week. Echo. Smoking cessation. Think about recommendations deferred (per patient preference). RTC 3 months.  Time:   Today, I have spent 50 minutes with the patient with telehealth technology discussing the above problems.     Medication Adjustments/Labs and Tests Ordered: Current medicines are reviewed at length with the patient today.  Concerns regarding medicines are outlined above.   Tests Ordered: Orders Placed This Encounter  Procedures  . Basic metabolic panel  . Magnesium  . Ambulatory referral to Gastroenterology  . ECHOCARDIOGRAM COMPLETE    Medication Changes: Meds ordered this encounter  Medications  .  spironolactone (ALDACTONE) 25 MG tablet    Sig: Take 1 tablet (25 mg total) by mouth daily.    Dispense:  90 tablet    Refill:  3  . chlorthalidone (HYGROTON) 25 MG tablet    Sig: Take 1 tablet (25 mg total) by mouth daily.    Dispense:  90 tablet    Refill:  3  . potassium chloride (KLOR-CON) 10 MEQ tablet    Sig: Take 1 tablet (10 mEq total) by mouth daily for 5 days.    Dispense:  5 tablet    Refill:  0    Follow Up:  In Person in 3 month(s)  Signed, Perina Salvaggio D Sivan Cuello, [place]

## 2019-10-05 NOTE — Telephone Encounter (Signed)
Patient calling in with questions regarding mychart message from [place]. Patient is available after 4 to talk. Patient just wants clarification on condition, possible treatment and next steps  Please advise

## 2019-10-06 ENCOUNTER — Ambulatory Visit: Payer: BC Managed Care – PPO | Admitting: Cardiovascular Disease

## 2019-10-07 NOTE — Telephone Encounter (Signed)
Incoming call received by patient. She is okay coming into the office this Friday.   spoke to Bianca Quinones, PA and she agreed to add on for 11:30A this Friday since schedule is booked.   Pt will call back after she speaks with her boss to confirm.   Scheduling made aware.

## 2019-10-08 ENCOUNTER — Ambulatory Visit (INDEPENDENT_AMBULATORY_CARE_PROVIDER_SITE_OTHER): Payer: BC Managed Care – PPO | Admitting: Obstetrics and Gynecology

## 2019-10-08 ENCOUNTER — Other Ambulatory Visit: Payer: Self-pay

## 2019-10-08 ENCOUNTER — Encounter: Payer: Self-pay | Admitting: Obstetrics and Gynecology

## 2019-10-08 VITALS — BP 140/82 | Ht 63.0 in | Wt 246.0 lb

## 2019-10-08 DIAGNOSIS — D251 Intramural leiomyoma of uterus: Secondary | ICD-10-CM | POA: Diagnosis not present

## 2019-10-08 DIAGNOSIS — Z1239 Encounter for other screening for malignant neoplasm of breast: Secondary | ICD-10-CM

## 2019-10-08 DIAGNOSIS — N951 Menopausal and female climacteric states: Secondary | ICD-10-CM

## 2019-10-08 DIAGNOSIS — D252 Subserosal leiomyoma of uterus: Secondary | ICD-10-CM

## 2019-10-08 DIAGNOSIS — Z1329 Encounter for screening for other suspected endocrine disorder: Secondary | ICD-10-CM | POA: Diagnosis not present

## 2019-10-08 DIAGNOSIS — M546 Pain in thoracic spine: Secondary | ICD-10-CM

## 2019-10-08 DIAGNOSIS — N62 Hypertrophy of breast: Secondary | ICD-10-CM

## 2019-10-08 DIAGNOSIS — R002 Palpitations: Secondary | ICD-10-CM

## 2019-10-08 DIAGNOSIS — Z01419 Encounter for gynecological examination (general) (routine) without abnormal findings: Secondary | ICD-10-CM

## 2019-10-08 DIAGNOSIS — E049 Nontoxic goiter, unspecified: Secondary | ICD-10-CM | POA: Diagnosis not present

## 2019-10-08 DIAGNOSIS — G8929 Other chronic pain: Secondary | ICD-10-CM

## 2019-10-08 NOTE — Patient Instructions (Signed)
[facility] [address]  [place] [address]  [facility] [place]  [address] [place] [address]  Phone: [phone]  

## 2019-10-08 NOTE — Progress Notes (Signed)
Gynecology Annual Exam  PCP: Jodelle Green, FNP  Chief Complaint:  Chief Complaint  Patient presents with  . Gynecologic Exam    Very bloated, and LQ pain     History of Present Illness: Patient is a 44 y.o. G4P0000 presents for annual exam. The patient has no complaints today.   LMP: Patient's last menstrual period was 09/13/2019 (exact date).  Patient with known history of multiple fibroids, last imaged last year and noted to be stable in overall size and appearance.  Patient was interested in conception and we discussed the low rate of spontaneous conception at her age as well as the possible management options for her fibroids including expectant, [place], myomectomy, and hysterectomy.  Average Interval: regular, 28 days Duration of flow: 5 days Heavy Menses: yes however lighter in the past year Clots: yes Intermenstrual Bleeding: no Postcoital Bleeding: no Dysmenorrhea: yes   The patient is sexually active. She currently uses none for contraception. She denies dyspareunia.  The patient does perform self breast exams.  There is no notable family history of breast or ovarian cancer in her family.  The patient wears seatbelts: yes.   The patient has regular exercise: not asked.    The patient denies current symptoms of depression.    Review of Systems: Review of Systems  Constitutional: Negative for chills and fever.  HENT: Negative for congestion.   Respiratory: Negative for cough and shortness of breath.   Cardiovascular: Negative for chest pain and palpitations.  Gastrointestinal: Negative for abdominal pain, constipation, diarrhea, heartburn, nausea and vomiting.  Genitourinary: Negative for dysuria, frequency and urgency.  Skin: Negative for itching and rash.  Neurological: Negative for dizziness and headaches.  Endo/Heme/Allergies: Negative for polydipsia.  Psychiatric/Behavioral: Negative for depression.    Past Medical History:  Past Medical History:    Diagnosis Date  . Brain aneurysm 06/29/2015  . Chicken pox   . Headache   . Hypertension   . Sleep apnea 10/2018  . SVT (supraventricular tachycardia) (HCC)     Past Surgical History:  Past Surgical History:  Procedure Laterality Date  . CRANIOTOMY Right 06/29/2015   Procedure: CRANIOTOMY INTRACRANIAL ANEURYSM CLIPPING;  Surgeon: Kevan Ny Ditty, MD;  Location: [facility];  Service: Neurosurgery;  Laterality: Right;  . IR GENERIC HISTORICAL  05/25/2016   IR ANGIO INTRA EXTRACRAN SEL INTERNAL CAROTID BILAT MOD SED 05/25/2016 Consuella Lose, MD [facility]  . IR GENERIC HISTORICAL  06/29/2015   IR ANGIO INTRA EXTRACRAN SEL INTERNAL CAROTID BILAT MOD SED 06/29/2015 Consuella Lose, MD [facility]  . IR GENERIC HISTORICAL  06/29/2015   IR ANGIO VERTEBRAL SEL VERTEBRAL UNI L MOD SED 06/29/2015 Consuella Lose, MD [facility]  . IR GENERIC HISTORICAL  06/29/2015   IR 3D INDEPENDENT WKST 06/29/2015 Consuella Lose, MD [facility]    Gynecologic History:  Patient's last menstrual period was 09/13/2019 (exact date). Contraception: none Last Pap: Results were:  01/16/2018 ASCUS with NEGATIVE high risk HPV   04/15/2015 NIL and HR HPV+ Last mammogram: no priors  Obstetric History: G4P0000  Family History:  Family History  Problem Relation Age of Onset  . Hypertension Mother   . Hypertension Father   . Hypertension Maternal Aunt   . Hypertension Maternal Uncle   . Hypertension Paternal Aunt   . Hypertension Paternal Uncle   . Hypertension Maternal Grandmother   . Hypertension Maternal Grandfather   . Hypertension Paternal Grandmother   . Hypertension Paternal Grandfather     Social  History:  Social History   Socioeconomic History  . Marital status: Single    Spouse name: Not on file  . Number of children: Not on file  . Years of education: Not on file  . Highest education level: Not on file  Occupational History  . Not on file  Tobacco Use  .  Smoking status: Current Every Day Smoker    Packs/day: 0.50    Years: 22.00    Pack years: 11.00  . Smokeless tobacco: Never Used  . Tobacco comment: 1 pack lasts a day or two  Substance and Sexual Activity  . Alcohol use: No  . Drug use: No  . Sexual activity: Yes    Birth control/protection: None  Other Topics Concern  . Not on file  Social History Narrative  . Not on file   Social Determinants of Health   Financial Resource Strain:   . Difficulty of Paying Living Expenses: Not on file  Food Insecurity:   . Worried About Charity fundraiser in the Last Year: Not on file  . Ran Out of Food in the Last Year: Not on file  Transportation Needs:   . Lack of Transportation (Medical): Not on file  . Lack of Transportation (Non-Medical): Not on file  Physical Activity:   . Days of Exercise per Week: Not on file  . Minutes of Exercise per Session: Not on file  Stress:   . Feeling of Stress : Not on file  Social Connections:   . Frequency of Communication with Friends and Family: Not on file  . Frequency of Social Gatherings with Friends and Family: Not on file  . Attends Religious Services: Not on file  . Active Member of Clubs or Organizations: Not on file  . Attends Archivist Meetings: Not on file  . Marital Status: Not on file  Intimate Partner Violence:   . Fear of Current or Ex-Partner: Not on file  . Emotionally Abused: Not on file  . Physically Abused: Not on file  . Sexually Abused: Not on file    Allergies:  Allergies  Allergen Reactions  . Amoxicillin   . Augmentin [Amoxicillin-Pot Clavulanate] Hives  . Ciprofloxacin Itching    Patient states she starts itching from inside or internal.  . Clavulanic Acid     Medications: Prior to Admission medications   Medication Sig Start Date End Date Taking? Authorizing Provider  albuterol (PROVENTIL HFA;VENTOLIN HFA) 108 (90 Base) MCG/ACT inhaler Inhale 2 puffs into the lungs every 6 (six) hours as needed  for wheezing or shortness of breath. 08/29/18   Jodelle Green, FNP  carvedilol (COREG) 12.5 MG tablet Take 1 tablet (12.5 mg total) by mouth 2 (two) times daily. 08/27/19 11/25/19  Marrianne Mood D, PA-C  chlorthalidone (HYGROTON) 25 MG tablet Take 1 tablet (25 mg total) by mouth daily. 10/01/19 12/30/19  Marrianne Mood D, PA-C  cholecalciferol (VITAMIN D3) 25 MCG (1000 UT) tablet Take 1 tablet (1,000 Units total) by mouth daily. 09/26/18   Guse, Jacquelynn Cree, FNP  potassium chloride (KLOR-CON) 10 MEQ tablet Take 1 tablet (10 mEq total) by mouth daily for 5 days. 10/01/19 10/06/19  Marrianne Mood D, PA-C  Probiotic Product (FORTIFY PROBIOTIC WOMENS EX ST) CPDR Take 1 capsule by mouth daily. 09/17/18   Jodelle Green, FNP  spironolactone (ALDACTONE) 25 MG tablet Take 1 tablet (25 mg total) by mouth daily. 10/01/19 12/30/19  Marrianne Mood D, PA-C    Physical Exam Vitals: Blood pressure  140/82, height 5\' 3"  (1.6 m), weight 246 lb (111.6 kg), last menstrual period 09/13/2019.   General: NAD HEENT: normocephalic, anicteric Thyroid: does feel slightly enlarged, no palpable nodules Pulmonary: No increased work of breathing, CTAB Cardiovascular: RRR, distal pulses 2+ Breast: Breast symmetrical, no tenderness, no palpable nodules or masses, no skin or nipple retraction present, no nipple discharge.  No axillary or supraclavicular lymphadenopathy. Abdomen: NABS, soft, non-tender, non-distended.  Umbilicus without lesions.  No hepatomegaly, splenomegaly or masses palpable. No evidence of hernia  Genitourinary:  External: Normal external female genitalia.  Normal urethral meatus, normal Bartholin's and Skene's glands.    Vagina: Normal vaginal mucosa, no evidence of prolapse.    Cervix: Grossly normal in appearance, no bleeding  Uterus: Enlarged 16 week size, dominant anterior right anterior fibrod, mobile, normal contour.  No CMT  Adnexa: ovaries non-enlarged, no adnexal masses  Rectal:  deferred  Lymphatic: no evidence of inguinal lymphadenopathy Extremities: no edema, erythema, or tenderness Neurologic: Grossly intact Psychiatric: mood appropriate, affect full  Female chaperone present for pelvic and breast  portions of the physical exam    Assessment: 44 y.o. G4P0000 routine annual exam  Plan: Problem List Items Addressed This Visit      Genitourinary   Intramural and subserous leiomyoma of uterus   Relevant Orders   US PELVIS TRANSVAGINAL NON-OB (TV ONLY)   US Pelvis Complete    Other Visit Diagnoses    Encounter for gynecological examination without abnormal finding    -  Primary   Breast screening       Relevant Orders   MM 3D SCREEN BREAST BILATERAL   Palpitations       Relevant Orders   CBC   Thyroid Panel With TSH   Thyroid disorder screening       Relevant Orders   CBC   Enlarged thyroid       Relevant Orders   CBC   Perimenopausal vasomotor symptoms       Relevant Orders   CBC   Chronic midline thoracic back pain       Relevant Orders   Ambulatory referral to Pediatric Plastic Surgery   Macromastia       Relevant Orders   Ambulatory referral to Pediatric Plastic Surgery      1) Mammogram - recommend yearly screening mammogram.  Mammogram Was ordered today   2) STI screening  was notoffered and therefore not obtained  3) ASCCP guidelines and rational discussed.  Patient opts for every 3 years screening interval  4) Contraception - the patient is currently using  none.  She is attempting to conceive in the near future  5) Colonoscopy -- starting at age 33  6) Routine healthcare maintenance including cholesterol, diabetes screening discussed managed by PCP   7) Macromastia and thoracic back pain -  plastic referral back pain breast reduction  8) Palpitations - starting blood thinner had been discussed with cardiology.  Given the fact that patient does suffer from heavy period and fibroids CBC obtained. Will also obtain repeat  ultrasound to look at size of fibroids  9) Return in about 2 weeks (around 10/22/2019) for TVUS and follow up.   Malachy Mood, MD, [facility], [facility] 10/08/2019, 2:56 PM

## 2019-10-09 ENCOUNTER — Encounter: Payer: Self-pay | Admitting: Physician Assistant

## 2019-10-09 ENCOUNTER — Ambulatory Visit (INDEPENDENT_AMBULATORY_CARE_PROVIDER_SITE_OTHER): Payer: BC Managed Care – PPO | Admitting: Physician Assistant

## 2019-10-09 ENCOUNTER — Other Ambulatory Visit: Payer: Self-pay

## 2019-10-09 ENCOUNTER — Emergency Department
Admission: EM | Admit: 2019-10-09 | Discharge: 2019-10-09 | Disposition: A | Discharge status: Home / Self Care | Payer: BC Managed Care – PPO | Attending: Emergency Medicine | Admitting: Emergency Medicine

## 2019-10-09 VITALS — BP 140/80 | HR 67 | Ht 63.0 in | Wt 245.0 lb

## 2019-10-09 DIAGNOSIS — E0590 Thyrotoxicosis, unspecified without thyrotoxic crisis or storm: Secondary | ICD-10-CM

## 2019-10-09 DIAGNOSIS — G4730 Sleep apnea, unspecified: Secondary | ICD-10-CM | POA: Diagnosis not present

## 2019-10-09 DIAGNOSIS — R7989 Other specified abnormal findings of blood chemistry: Secondary | ICD-10-CM

## 2019-10-09 DIAGNOSIS — E876 Hypokalemia: Secondary | ICD-10-CM

## 2019-10-09 DIAGNOSIS — E049 Nontoxic goiter, unspecified: Secondary | ICD-10-CM | POA: Diagnosis not present

## 2019-10-09 DIAGNOSIS — I471 Supraventricular tachycardia: Secondary | ICD-10-CM

## 2019-10-09 DIAGNOSIS — I10 Essential (primary) hypertension: Secondary | ICD-10-CM | POA: Insufficient documentation

## 2019-10-09 DIAGNOSIS — E040 Nontoxic diffuse goiter: Secondary | ICD-10-CM

## 2019-10-09 DIAGNOSIS — Z79899 Other long term (current) drug therapy: Secondary | ICD-10-CM | POA: Diagnosis not present

## 2019-10-09 DIAGNOSIS — R140 Abdominal distension (gaseous): Secondary | ICD-10-CM

## 2019-10-09 DIAGNOSIS — E0500 Thyrotoxicosis with diffuse goiter without thyrotoxic crisis or storm: Secondary | ICD-10-CM | POA: Diagnosis not present

## 2019-10-09 DIAGNOSIS — K5900 Constipation, unspecified: Secondary | ICD-10-CM

## 2019-10-09 DIAGNOSIS — F17210 Nicotine dependence, cigarettes, uncomplicated: Secondary | ICD-10-CM | POA: Diagnosis not present

## 2019-10-09 DIAGNOSIS — Z720 Tobacco use: Secondary | ICD-10-CM

## 2019-10-09 DIAGNOSIS — R1010 Upper abdominal pain, unspecified: Secondary | ICD-10-CM | POA: Diagnosis not present

## 2019-10-09 DIAGNOSIS — R001 Bradycardia, unspecified: Secondary | ICD-10-CM | POA: Diagnosis not present

## 2019-10-09 LAB — CBC
HCT: 41.1 % (ref 36.0–46.0)
Hematocrit: 42.8 % (ref 34.0–46.6)
Hemoglobin: 14.3 g/dL (ref 11.1–15.9)
Hemoglobin: 13.9 g/dL (ref 12.0–15.0)
MCH: 29 pg (ref 26.6–33.0)
MCH: 28.5 pg (ref 26.0–34.0)
MCHC: 33.4 g/dL (ref 31.5–35.7)
MCHC: 33.8 g/dL (ref 30.0–36.0)
MCV: 87 fL (ref 79–97)
MCV: 84.4 fL (ref 80.0–100.0)
Platelets: 253 10*3/uL (ref 150–450)
Platelets: 254 10*3/uL (ref 150–400)
RBC: 4.93 x10E6/uL (ref 3.77–5.28)
RBC: 4.87 MIL/uL (ref 3.87–5.11)
RDW: 13.2 % (ref 11.7–15.4)
RDW: 13.2 % (ref 11.5–15.5)
WBC: 5.8 10*3/uL (ref 3.4–10.8)
WBC: 5.9 10*3/uL (ref 4.0–10.5)
nRBC: 0 % (ref 0.0–0.2)

## 2019-10-09 LAB — COMPREHENSIVE METABOLIC PANEL
ALT: 19 U/L (ref 0–44)
AST: 20 U/L (ref 15–41)
Albumin: 4.2 g/dL (ref 3.5–5.0)
Alkaline Phosphatase: 53 U/L (ref 38–126)
Anion gap: 8 (ref 5–15)
BUN: 12 mg/dL (ref 6–20)
CO2: 28 mmol/L (ref 22–32)
Calcium: 9.3 mg/dL (ref 8.9–10.3)
Chloride: 100 mmol/L (ref 98–111)
Creatinine, Ser: 0.88 mg/dL (ref 0.44–1.00)
GFR calc Af Amer: >60 mL/min (ref >60)
GFR calc non Af Amer: >60 mL/min (ref >60)
Glucose, Bld: 112 mg/dL — ABNORMAL HIGH (ref 70–99)
Potassium: 2.5 mmol/L — CL (ref 3.5–5.1)
Sodium: 136 mmol/L (ref 135–145)
Total Bilirubin: 0.9 mg/dL (ref 0.3–1.2)
Total Protein: 7.7 g/dL (ref 6.5–8.1)

## 2019-10-09 LAB — URINALYSIS, COMPLETE (UACMP) WITH MICROSCOPIC
Bilirubin Urine: NEGATIVE
Glucose, UA: NEGATIVE mg/dL
Ketones, ur: NEGATIVE mg/dL
Leukocytes,Ua: NEGATIVE
Nitrite: NEGATIVE
Protein, ur: NEGATIVE mg/dL
Specific Gravity, Urine: 1.017 (ref 1.005–1.030)
pH: 7 (ref 5.0–8.0)

## 2019-10-09 LAB — TSH: TSH: 0.27 u[IU]/mL — ABNORMAL LOW (ref 0.350–4.500)

## 2019-10-09 LAB — T4, FREE: Free T4: 1.25 ng/dL — ABNORMAL HIGH (ref 0.61–1.12)

## 2019-10-09 LAB — LIPASE, BLOOD: Lipase: 22 U/L (ref 11–51)

## 2019-10-09 LAB — THYROID PANEL WITH TSH
Free Thyroxine Index: 2.8 (ref 1.2–4.9)
T3 Uptake Ratio: 34 % (ref 24–39)
T4, Total: 8.1 ug/dL (ref 4.5–12.0)
TSH: 0.212 u[IU]/mL — ABNORMAL LOW (ref 0.450–4.500)

## 2019-10-09 LAB — TROPONIN I (HIGH SENSITIVITY): Troponin I (High Sensitivity): 11 ng/L (ref <18)

## 2019-10-09 MED ORDER — METHIMAZOLE 10 MG PO TABS: 10.0000 mg | ORAL_TABLET | Freq: Every day | ORAL | 0 refills | Status: DC

## 2019-10-09 MED ORDER — METHIMAZOLE 10 MG PO TABS
20.0000 mg | ORAL_TABLET | ORAL | Status: AC
  Administered 2019-10-09: 20 mg via ORAL
  Filled 2019-10-09 (×2): qty 2

## 2019-10-09 MED ORDER — SODIUM CHLORIDE 0.9% FLUSH: 3.0000 mL | Freq: Once | INTRAVENOUS | Status: DC

## 2019-10-09 MED ORDER — ATENOLOL 25 MG PO TABS: 25.0000 mg | ORAL_TABLET | Freq: Every day | ORAL | 0 refills | Status: DC

## 2019-10-09 MED ORDER — ATENOLOL 25 MG PO TABS
50.0000 mg | ORAL_TABLET | Freq: Once | ORAL | Status: AC
  Administered 2019-10-09: 50 mg via ORAL
  Filled 2019-10-09: qty 2

## 2019-10-09 MED ORDER — POTASSIUM CHLORIDE CRYS ER 20 MEQ PO TBCR
60.0000 meq | EXTENDED_RELEASE_TABLET | Freq: Once | ORAL | Status: AC
  Administered 2019-10-09: 60 meq via ORAL
  Filled 2019-10-09: qty 3

## 2019-10-09 NOTE — Patient Instructions (Signed)
Medication Instructions:  Your physician recommends that you continue on your current medications as directed. Please refer to the Current Medication list given to you today.  *If you need a refill on your cardiac medications before your next appointment, please call your pharmacy*   Lab Work: Your physician recommends that you have lab work today(A1C, Art gallery manager, Engineer, materials)  If you have labs (blood work) drawn today and your tests are completely normal, you will receive your results only by: Marland Kitchen MyChart Message (if you have MyChart) OR . A paper copy in the mail If you have any lab test that is abnormal or we need to change your treatment, we will call you to review the results.   Testing/Procedures: None ordered    Follow-Up: At [facility], you and your health needs are our priority.  As part of our continuing mission to provide you with exceptional heart care, we have created designated Provider Care Teams.  These Care Teams include your primary Cardiologist (physician) and Advanced Practice Providers (APPs -  Physician Assistants and Nurse Practitioners) who all work together to provide you with the care you need, when you need it.  We recommend signing up for the patient portal called "MyChart".  Sign up information is provided on this After Visit Summary.  MyChart is used to connect with patients for Virtual Visits (Telemedicine).  Patients are able to view lab/test results, encounter notes, upcoming appointments, etc.  Non-urgent messages can be sent to your provider as well.   To learn more about what you can do with MyChart, go to [email or web address].    Your next appointment:   As advised by Dr. Caryl Comes  - Needs Dr. Caryl Comes for next available  -Ref to Endocrine

## 2019-10-09 NOTE — ED Triage Notes (Addendum)
Pt comes via POV from home with c/o upper gastric abdominal pain.  Pt states she feels bloated and has not had a BM in several days.  Pt states she feels like her belly being swollen makes it difficult for her to breath. Pt states she feels flushed. Pt states she has been eating and drinking fine.

## 2019-10-09 NOTE — ED Notes (Signed)
Called pharm. State Tapazole will be sent to main ED soon.

## 2019-10-09 NOTE — ED Notes (Signed)
Pt standing at bedside for increased comfort.

## 2019-10-09 NOTE — ED Notes (Signed)
Pt alert and calmly laying in bed as this RN introduced self. Pt attempting to provide urine sample now. Ambulatory to bathroom; steady.

## 2019-10-09 NOTE — ED Notes (Signed)
Pt signed printed d/c paperwork.  

## 2019-10-09 NOTE — ED Notes (Signed)
EDP at bedside assessing pt.  

## 2019-10-09 NOTE — ED Notes (Addendum)
Pt reports having feeling of "fullness" in her upper abdomen; states causes chest discomfort when she breathes deeply as it feels like the diaphragm cannot expand all of the way. Abdomen is distended per pt. Soft. Last BM this morning and it was small and hard. Last baseline BM was Monday per pt. Bowel sounds active but faint. Pt states normally takes potassium pills but has missed taking them for the last two days as she was "too busy".

## 2019-10-09 NOTE — Progress Notes (Signed)
Office Visit    Patient Name: [name] Date of Encounter: 10/09/2019  Primary Care Provider:  Jodelle Green, FNP Primary Cardiologist:  Kathlyn Sacramento, MD  Chief Complaint    44 year old female with history of resistant hypertension, tobacco use, obesity, sleep apnea, SVT, and brain aneurysm, and here for discussion regarding SVT options and reporting constipation x4 days.  Past Medical History    Past Medical History:  Diagnosis Date   Brain aneurysm 06/29/2015   Chicken pox    Headache    Hypertension    Sleep apnea 10/2018   SVT (supraventricular tachycardia) ([place])    Past Surgical History:  Procedure Laterality Date   CRANIOTOMY Right 06/29/2015   Procedure: CRANIOTOMY INTRACRANIAL ANEURYSM CLIPPING;  Surgeon: Kevan Ny Ditty, MD;  Location: [facility];  Service: Neurosurgery;  Laterality: Right;   IR GENERIC HISTORICAL  05/25/2016   IR ANGIO INTRA EXTRACRAN SEL INTERNAL CAROTID BILAT MOD SED 05/25/2016 Consuella Lose, MD [facility]   IR GENERIC HISTORICAL  06/29/2015   IR ANGIO INTRA EXTRACRAN SEL INTERNAL CAROTID BILAT MOD SED 06/29/2015 Consuella Lose, MD [facility]   IR GENERIC HISTORICAL  06/29/2015   IR ANGIO VERTEBRAL SEL VERTEBRAL UNI L MOD SED 06/29/2015 Consuella Lose, MD [facility]   IR GENERIC HISTORICAL  06/29/2015   IR 3D INDEPENDENT WKST 06/29/2015 Consuella Lose, MD [facility]    Allergies  Allergies  Allergen Reactions   Amoxicillin    Augmentin [Amoxicillin-Pot Clavulanate] Hives   Ciprofloxacin Itching    Patient states she starts itching from inside or internal.   Clavulanic Acid     History of Present Illness    Bianca Acosta is a 44 year old female with history of resistant hypertension, tobacco use, obesity, sleep apnea, and brain aneurysm. She reports prolonged history of difficult to control hypertension, which started in her early 40s. She also has a strong known family  history of hypertension. She was seen in January 2020, at which time she was on Bystolic and triamterene hydrochlorothiazide. She did not tolerate amlodipine well, as it made her feel sick on her stomach. She was seen by her primary cardiologist and reported taking Aleve for headache and menstrual pain and was advised to decrease NSAID use.She reportedly had fibroids with need for surgery in the near future.ShereportedDOE but denied CP or SOB at rest. She was suspected of having sleep apnea and was referred for evaluation with subsequent diagnosis of sleep apnea but having a difficult time adjusting to her CPAP. She was switched from hydrochlorothiazide to chlorthalidone with addition of spironolactone 50 mg once daily. Her EKG was noted T wave changes, suggestive of ischemia and prolonged QT interval. She underwent echo which showed normal LVSFwithout significant valvular abnormalities. There wasmoderate LVH.In early January 2021, she reported anxietyregarding her cardiovascular health 2/2 her father's cardiac history, as well as recent sx of CP and increased awareness of her heart. She reported DOE, which she felt had increased since her previous visit. She also noted atypical CP and that she felt her heart pounding and racing. She was smoking 6-7 cigarettes per day though noted she was willing to try to quit.She was started on Coreg for her racing HR and elevated BP in clinic. 2 week Zio-XT was also placed and showed 4 beat run of WCT, likely SVT with aberrancy. In addition, it noted 4 brief runs of SVT, the longest of which was 9.9 seconds. She had rare PACs/PVCs at less than 1% burden.  Some of her triggered events corresponded to SVT/PVCs. Cardiac CT performed 2/2 atypical CP and family history of cardiac dz with risk factors including tobacco use.  CAC score 0. KCl supplementation provided given hypokalemia with plan for follow-up labs to reassess K/Mg. When last seen via virtual clinic, she  reported she was doing well without DOE (though no recent activity on vacation) and improved sx since starting Coreg. BP / HR was still elevated with Coreg increased. She noted a desire to get off all medications and also update her previous echo from 12/2018 given her sleep apnea.   Today, she presents to discuss recent SVT material provided after her most recent telemedicine visit. She reports that she has tolerated the increase in her Coreg well with continued decreased sx. BP elevated today; however, with consideration of her abdominal / GI issues and pain as outlined directly below. No further atypical CP at rest or with exertion. No presyncope, syncope, orthopnea, or PND. No DOE. She feels SOB with inhalation and improved with exhalation, thought 2/2 recently progressed abdominal bloating and constipation. For the last 4 days, she reports constipation and abdominal bloating with rarel "thin /pellet like" stool but has passed gas. Exam and ROS today not consistent with cardiac etiology of abdominal distention with encouragement to f/u with GI; however, she reports the earliest GI appointment available is 11/2019. She again attempted to use her CPAP recently (x1 night) and wonders if this may have exacerbated her abdominal bloating or contributed to her neck swelling. She wonders about the CPAP settings and will f/u with Pulmonlogy.  She has now seen OBGYN for her uterine fibroids with labs checked and abnormal TSH but nl FT4. She has not yet increased activity or further cut back on smoking but continues to want to make these changes. She estimates 1 pk or less of cigarettes every two days and usually with coffee. She drinks alcohol every few months and usually only one or two drinks. No s/sx of bleeding. Zio final read reviewed, as well as that there is no recommendation for [place]. Continue to recommend rate control with patient desire to get off of all medications if possible.   Home Medications    Prior to  Admission medications   Medication Sig Start Date End Date Taking? Authorizing Provider  albuterol (PROVENTIL HFA;VENTOLIN HFA) 108 (90 Base) MCG/ACT inhaler Inhale 2 puffs into the lungs every 6 (six) hours as needed for wheezing or shortness of breath. 08/29/18  Yes Guse, Jacquelynn Cree, FNP  carvedilol (COREG) 12.5 MG tablet Take 1 tablet (12.5 mg total) by mouth 2 (two) times daily. 08/27/19 11/25/19 Yes Malikai Gut D, PA-C  chlorthalidone (HYGROTON) 25 MG tablet Take 1 tablet (25 mg total) by mouth daily. 10/01/19 12/30/19 Yes Colt Martelle D, PA-C  cholecalciferol (VITAMIN D3) 25 MCG (1000 UT) tablet Take 1 tablet (1,000 Units total) by mouth daily. 09/26/18  Yes Guse, Jacquelynn Cree, FNP  potassium chloride (KLOR-CON) 10 MEQ tablet Take 1 tablet (10 mEq total) by mouth daily for 5 days. 10/01/19 10/09/19 Yes Marrianne Mood D, PA-C  Probiotic Product (FORTIFY PROBIOTIC WOMENS EX ST) CPDR Take 1 capsule by mouth daily. 09/17/18  Yes Guse, Jacquelynn Cree, FNP  spironolactone (ALDACTONE) 25 MG tablet Take 1 tablet (25 mg total) by mouth daily. 10/01/19 12/30/19 Yes Marrianne Mood D, PA-C    Review of Systems     She denies chest pain, palpitations, dyspnea, pnd, orthopnea, n, v, dizziness, syncope, edema, weight gain, or early satiety.  She reports significant discomfort 2/2 constipation and also neck swelling. SOB with inspiration, attributed to bloating. All other systems reviewed and are otherwise negative except as noted above.  Physical Exam    VS:  BP 140/80 (BP Location: Left Arm, Patient Position: Sitting, Cuff Size: Large)    Pulse 67    Ht 5\' 3"  (1.6 m)    Wt 245 lb (111.1 kg)    LMP 09/14/2019    SpO2 98%    BMI 43.40 kg/m  , BMI Body mass index is 43.4 kg/m. GEN: Well nourished, well developed, uncomfortable and unable to stay seated  HEENT: normal. Neck: Supple, JVD difficult to assess 2/2 body habitus. No carotid bruits or masses. Cardiac: RRR, no murmurs, rubs, or gallops. No clubbing,  cyanosis, edema.  Radials/DP/PT 2+ and equal bilaterally.  Respiratory: Decrease respiratory effort likely 2/2 discomfort, clear to auscultation bilaterally. GI: Diffusely tender, hypoactive bowel sounds MS: no deformity or atrophy. Skin: warm and dry, no rash. Neuro:  Strength and sensation are intact. Psych: Anxious and noticeably uncomfortable  Accessory Clinical Findings    ECG personally reviewed by me today - NSR, 67bpm, LVH with repolarization abnormalities, inferior / anterolateral TWI and prolonged QT with LAE as noted on previous EKGs but no acute changes from that of previous. --no acute changes  VITALS Reviewed   Temp Readings from Last 3 Encounters:  07/12/19 99 F (37.2 C) (Oral)  10/06/18 98.7 F (37.1 C) (Oral)  09/26/18 98.3 F (36.8 C) (Oral)   BP Readings from Last 3 Encounters:  10/09/19 140/80  10/08/19 140/82  10/01/19 136/86   Pulse Readings from Last 3 Encounters:  10/09/19 67  10/01/19 87  08/27/19 84    Wt Readings from Last 3 Encounters:  10/09/19 245 lb (111.1 kg)  10/08/19 246 lb (111.6 kg)  10/01/19 247 lb (112 kg)     LABS  reviewed   [place] present? Yes/No: No  Lab Results  Component Value Date   WBC 5.8 10/08/2019   HGB 14.3 10/08/2019   HCT 42.8 10/08/2019   MCV 87 10/08/2019   PLT 253 10/08/2019   Lab Results  Component Value Date   CREATININE 1.01 (H) 08/27/2019   BUN 14 08/27/2019   NA 144 08/27/2019   K 3.5 08/27/2019   CL 102 08/27/2019   CO2 28 08/27/2019   Lab Results  Component Value Date   ALT 14 08/29/2018   AST 13 08/29/2018   ALKPHOS 52 08/29/2018   BILITOT 0.5 08/29/2018   Lab Results  Component Value Date   CHOL 134 05/16/2018   HDL 49 (L) 05/16/2018   LDLCALC 69 05/16/2018   TRIG 81 05/16/2018   CHOLHDL 2.7 05/16/2018    Lab Results  Component Value Date   HGBA1C 5.8 04/23/2016   Lab Results  Component Value Date   TSH 0.212 (L) 10/08/2019     STUDIES/PROCEDURES reviewed     Echo 12/2018 FINDINGS  Left Ventricle: The left ventricle has normal systolic function, with an  ejection fraction of 60-65%. The cavity size was normal. There is  moderately increased left ventricular wall thickness. Left ventricular  diastolic Doppler parameters are consistent  with pseudonormalization. No evidence of left ventricular regional wall  motion abnormalities..  Right Ventricle: The right ventricle has normal systolic function. The  cavity was normal. There is no increase in right ventricular wall  thickness. Right ventricular systolic pressure could not be assessed.  Left Atrium: Left atrial size was mildly  dilated.  Right Atrium: Right atrial size was normal in size. Right atrial pressure  is estimated at 3 mmHg.  Interatrial Septum: The interatrial septum was not well visualized.  Pericardium: There is no evidence of pericardial effusion.  Mitral Valve: The mitral valve is normal in structure. Mitral valve  regurgitation is trivial by color flow Doppler.  Tricuspid Valve: The tricuspid valve is normal in structure. Tricuspid  valve regurgitation was not visualized by color flow Doppler.  Aortic Valve: The aortic valve is tricuspid Aortic valve regurgitation was  not visualized by color flow Doppler. There is no evidence of aortic valve  stenosis.  Pulmonic Valve: The pulmonic valve was grossly normal. Pulmonic valve  regurgitation is not visualized by color flow Doppler. No evidence of  pulmonic stenosis.  Aorta: The aortic root is normal in size and structure.  Pulmonary Artery: The pulmonary artery is not well seen.  Venous: The inferior vena cava measures 1.50 cm, is normal in size with  greater than 50% respiratory variability.   Zio 08/27/19 FINAL: Normal sinus rhythm with an average heart rate of 84 bpm. 4 beat run of wide-complex tachycardia likely SVT with aberrancy 4 short episodes of supraventricular tachycardia.  The longest lasted 9.9  seconds. Rare PACs and PVCs with a burden of less than 1%. Some triggered events correlated with SVTs and PVCs.  CT CAC Score 09/28/19 FINDINGS: Non-cardiac: See separate report from [facility]. Ascending Aorta: Normal caliber. Pericardium: Normal. Coronary arteries: Normal origins. IMPRESSION: Coronary calcium score of 0. Electronically Signed By: Eleonore Chiquito On: 09/28/2019 19:11  Assessment & Plan    Paroxysmal SVT, SVT with aberrancy / WCT PACs/PVCs --Increased Coreg at previous visit and tolerating well with reduced sx as previously reported. Final read of Zio shows 4 brief runs of SVT, 4 beat WCT likely SVT with aberrancy, and less than 1% burden of PACs/PVCs. No Afib. No indication for [place]. Continue current Coreg for rate and BP control. Also considered is diltiazem if repeat echo shows preserved EF; however, with consideration of possible associated LEE. Endocrinology referral provided (though recent thyroid panel available under results), and given recent abnormal / low TSH and neck swelling / goiter, and as this can contribute to elevated rates and ectopy/arrhythmia. BMET/Mg ordered, given recent hypokalemia and had not yet taken potassium tablets as of 2/18 with need for repeat labs. Discussed electrolyte imbalances increase risk for harmful arrhythmias. SVT ablation discussed, as information provided regarding SVT after recent visit touched on this as a treatment option. She is significantly symptomatic even with infrequent/ brief runs of SVT and rare ectopy; moreover, she wants to avoid medications. Given this, referral provided to EP to discuss possibility of ablation with consideration of further thyroid workup. Discussed that successful thyroid treatment may preclude the need for EP referral or ablation with patient understanding and indicating she will cancel EP visit if this is the case.       Hypokalemia --As above, ordered BMET/Mg  to recheck labs and  ensure K / Mg at goal given length of time and since first prescribed supplementation when last seen by her primary cardiologist (12/2018). Expressed ongoing concern that her K may be very low. She did reports today she did start to take her rx as of the last 1-2 days. If continued low K, consider powder K supplementation in the future to ensure adherence and avoid prolonged low K given her avoidance of the tablet.   Essential HTN, Resistant HTN --BP elevated in the  setting of pain / discomfort with GI sx / constipation. Continue current medications for now with reassessment after resolution of current abdominal pain. Also consider that untreated thyroid may contribute to current pressures.  Abnormal / Low TSH --TSH low / abnormal when checked at [facility] for goiter / neck swelling. Consider as contributing to constipation below. Referral provided to endocrinology for further workup.  Abdominal Bloating / Constipation --Reports constipation and severe bloating and discomfort. GI referral provided at previous visit with patent unable to get visit before April 2021. No s/sx of GI bleeding. A1C ordered to rule out untreated DM2. Recommended she call GI again to see if she can get an earlier appointment; however, if she is unable to be seen and continues to have abdominal pain and constipation, she should present to urgent care or the emergency department. She states she does not have a PCP. Exam / ROS not consistent with cardiac etiology. Endocrinology referral provided. Follow-up with GI as scheduled.      Atypical CP, resolved --No recurrent atypical CP. CAC score of 0. Previous echo as above with scheduled repeat echo per patient request and given desire to get off her medications. Abdominal sx not consistent with cardiac etiology as above.   [facility], owns CPAP --CPAP recommended. She plans to follow-up with pulmonology. Recommend treatment of OSA to prevent arrhythmia and for overall cardiac health. She  states she does not need a referral to Pulmonology and will make an appointment with Dr. Mortimer Fries this week, as she has previously seen him for her apnea.  Current tobacco use with desire to quit --Continues to want to quit smoking. Smoking cessation advised. Call 1-800-QUIT-NOW (609) [phone]) for help with quitting smoking.  Medication changes: None.  Labs ordered: A1C, BMET/Mg Studies / Imaging ordered: None. Echo previously ordered, pending. Disposition: RTC as needed or indicated by Dr. Caryl Comes  Total time spent with patient today 45 minutes. This includes reviewing records, evaluating the patient, and coordinating care. Face-to-face time >50%.    Arvil Chaco, PA-C 10/09/2019, 1:26 PM

## 2019-10-09 NOTE — ED Provider Notes (Signed)
[facility] Emergency Department Provider Note  ____________________________________________  Time seen: Approximately 7:08 PM  I have reviewed the triage vital signs and the nursing notes.   HISTORY  Chief Complaint Abdominal Pain    HPI Bianca Acosta is a 44 y.o. female with a history of hypertension SVT and sleep apnea who comes to the ED today due to fullness of the upper abdomen, feeling restless agitated warm and having difficulty with sleep.  Denies vomiting diarrhea or constipation, no fevers or chills.  Symptoms are constant, waxing waning, no aggravating or alleviating factors.  Recently went to her cardiologist who obtained labs and found her TSH level to be very very low.  Referred her to endocrinology.  Also referred her to gastroenterology.    Past Medical History:  Diagnosis Date  . Brain aneurysm 06/29/2015  . Chicken pox   . Headache   . Hypertension   . Sleep apnea 10/2018  . SVT (supraventricular tachycardia) [facility])      Patient Active Problem List   Diagnosis Date Noted  . URI (upper respiratory infection) 07/19/2019  . OSA (obstructive sleep apnea) 12/25/2018  . Intramural and subserous leiomyoma of uterus 10/25/2018  . Hives 07/23/2016  . Right knee pain 05/18/2016  . Morbid obesity with BMI of 40.0-44.9, adult ([place]) 04/23/2016  . HTN (hypertension) 04/23/2016  . Encounter for preventative adult health care exam with abnormal findings 04/23/2016  . 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 CRANIOTOMY Right 06/29/2015   Procedure: CRANIOTOMY INTRACRANIAL ANEURYSM CLIPPING;  Surgeon: Kevan Ny Ditty, MD;  Location: [place] NEURO ORS;  Service: Neurosurgery;  Laterality: Right;  . IR GENERIC HISTORICAL  05/25/2016   IR ANGIO INTRA EXTRACRAN SEL INTERNAL CAROTID BILAT MOD SED 05/25/2016 Consuella Lose, MD  [facility]  . IR GENERIC HISTORICAL  06/29/2015   IR ANGIO INTRA EXTRACRAN SEL INTERNAL CAROTID BILAT MOD SED 06/29/2015 Consuella Lose, MD [facility]  . IR GENERIC HISTORICAL  06/29/2015   IR ANGIO VERTEBRAL SEL VERTEBRAL UNI L MOD SED 06/29/2015 Consuella Lose, MD [facility]  . IR GENERIC HISTORICAL  06/29/2015   IR 3D INDEPENDENT WKST 06/29/2015 Consuella Lose, MD [facility]     Prior to Admission medications   Medication Sig Start Date End Date Taking? Authorizing Provider  albuterol (PROVENTIL HFA;VENTOLIN HFA) 108 (90 Base) MCG/ACT inhaler Inhale 2 puffs into the lungs every 6 (six) hours as needed for wheezing or shortness of breath. 08/29/18   Jodelle Green, FNP  atenolol (TENORMIN) 25 MG tablet Take 1 tablet (25 mg total) by mouth daily. 10/09/19 11/08/19  Carrie Mew, MD  carvedilol (COREG) 12.5 MG tablet Take 1 tablet (12.5 mg total) by mouth 2 (two) times daily. 08/27/19 11/25/19  Marrianne Mood D, PA-C  chlorthalidone (HYGROTON) 25 MG tablet Take 1 tablet (25 mg total) by mouth daily. 10/01/19 12/30/19  Marrianne Mood D, PA-C  cholecalciferol (VITAMIN D3) 25 MCG (1000 UT) tablet Take 1 tablet (1,000 Units total) by mouth daily. 09/26/18   Jodelle Green, FNP  methimazole (TAPAZOLE) 10 MG tablet Take 1 tablet (10 mg total) by mouth daily. 10/09/19   Carrie Mew, MD  potassium chloride (KLOR-CON) 10 MEQ tablet Take 1 tablet (10 mEq total) by mouth daily for 5 days. 10/01/19 10/09/19  Marrianne Mood D, PA-C  Probiotic Product (FORTIFY PROBIOTIC WOMENS EX ST) CPDR Take 1 capsule  by mouth daily. 09/17/18   Jodelle Green, FNP  spironolactone (ALDACTONE) 25 MG tablet Take 1 tablet (25 mg total) by mouth daily. 10/01/19 12/30/19  Marrianne Mood D, PA-C     Allergies Amoxicillin, Augmentin [amoxicillin-pot clavulanate], Ciprofloxacin, and Clavulanic acid   Family History  Problem Relation Age of Onset  . Hypertension Mother   . Hypertension Father    . Hypertension Maternal Aunt   . Hypertension Maternal Uncle   . Hypertension Paternal Aunt   . Hypertension Paternal Uncle   . Hypertension Maternal Grandmother   . Hypertension Maternal Grandfather   . Hypertension Paternal Grandmother   . Hypertension Paternal Grandfather     Social History Social History   Tobacco Use  . Smoking status: Current Every Day Smoker    Packs/day: 0.50    Years: 22.00    Pack years: 11.00  . Smokeless tobacco: Never Used  . Tobacco comment: 1 pack lasts a day or two  Substance Use Topics  . Alcohol use: No  . Drug use: No    Review of Systems  Constitutional:   No fever or chills.  Feels restless ENT:   No sore throat. No rhinorrhea. Cardiovascular:   No chest pain or syncope. Respiratory:   No dyspnea or cough. Gastrointestinal: Positive upper abdominal discomfort.  No vomiting diarrhea constipation. Musculoskeletal:   Negative for focal pain or swelling All other systems reviewed and are negative except as documented above in ROS and HPI.  ____________________________________________   PHYSICAL EXAM:  VITAL SIGNS: ED Triage Vitals  Enc Vitals Group     BP 10/09/19 1546 138/89     Pulse Rate 10/09/19 1546 82     Resp 10/09/19 1546 19     Temp 10/09/19 1546 98.4 F (36.9 C)     Temp src --      SpO2 10/09/19 1546 100 %     Weight 10/09/19 1542 245 lb (111.1 kg)     Height 10/09/19 1542 5\' 3"  (1.6 m)     Head Circumference --      Peak Flow --      Pain Score 10/09/19 1542 10     Pain Loc --      Pain Edu? --      Excl. in [place]? --     Vital signs reviewed, nursing assessments reviewed.   Constitutional:   Alert and oriented. Non-toxic appearance. Eyes:   Conjunctivae are normal. EOMI. PERRL. ENT      Head:   Normocephalic and atraumatic.      Nose:   Wearing a mask.      Mouth/Throat:   Wearing a mask.      Neck:   No meningismus. Full ROM.  Diffuse tender thyroid goiter Hematological/Lymphatic/Immunilogical:   No  cervical lymphadenopathy. Cardiovascular:   RRR. Symmetric bilateral radial and DP pulses.  No murmurs. Cap refill less than 2 seconds. Respiratory:   Normal respiratory effort without tachypnea/retractions. Breath sounds are clear and equal bilaterally. No wheezes/rales/rhonchi. Gastrointestinal:   Soft and nontender. Non distended. There is no CVA tenderness.  No rebound, rigidity, or guarding.  Musculoskeletal:   Normal range of motion in all extremities. No joint effusions.  No lower extremity tenderness.  No edema. Neurologic:   Normal speech and language.  Motor grossly intact. No acute focal neurologic deficits are appreciated.  Skin:    Skin is warm, dry and intact. No rash noted.  No petechiae, purpura, or bullae.  ____________________________________________  LABS (pertinent positives/negatives) (all labs ordered are listed, but only abnormal results are displayed) Labs Reviewed  COMPREHENSIVE METABOLIC PANEL - Abnormal; Notable for the following components:      Result Value   Potassium 2.5 (*)    Glucose, Bld 112 (*)    All other components within normal limits  URINALYSIS, COMPLETE (UACMP) WITH MICROSCOPIC - Abnormal; Notable for the following components:   Color, Urine YELLOW (*)    APPearance CLEAR (*)    Hgb urine dipstick SMALL (*)    Bacteria, UA RARE (*)    All other components within normal limits  TSH - Abnormal; Notable for the following components:   TSH 0.270 (*)    All other components within normal limits  T4, FREE - Abnormal; Notable for the following components:   Free T4 1.25 (*)    All other components within normal limits  LIPASE, BLOOD  CBC  POC URINE PREG, ED  TROPONIN I (HIGH SENSITIVITY)   ____________________________________________   EKG  Interpreted by me Sinus bradycardia rate of 57, normal axis and intervals.  Normal QRS and ST segments.  T wave inversions in V4 through V6.  This is unchanged compared to previous EKG August 27, 2019 although T wave inversions look a little bit more pronounced.  ____________________________________________    RADIOLOGY  No results found.  ____________________________________________   PROCEDURES Procedures  ____________________________________________    CLINICAL IMPRESSION / ASSESSMENT AND PLAN / ED COURSE  Medications ordered in the ED: Medications  sodium chloride flush (NS) 0.9 % injection 3 mL (has no administration in time range)  methimazole (TAPAZOLE) tablet 20 mg (20 mg Oral Given 10/09/19 1836)  atenolol (TENORMIN) tablet 50 mg (50 mg Oral Given 10/09/19 1816)  potassium chloride SA (KLOR-CON) CR tablet 60 mEq (60 mEq Oral Given 10/09/19 1816)    Pertinent labs & imaging results that were available during my care of the patient were reviewed by me and considered in my medical decision making (see chart for details).  Bianca Acosta was evaluated in Emergency Department on 10/09/2019 for the symptoms described in the history of present illness. She was evaluated in the context of the global COVID-19 pandemic, which necessitated consideration that the patient might be at risk for infection with the SARS-CoV-2 virus that causes COVID-19. Institutional protocols and algorithms that pertain to the evaluation of patients at risk for COVID-19 are in a state of rapid change based on information released by regulatory bodies including the CDC and federal and state organizations. These policies and algorithms were followed during the patient's care in the ED.   Patient presents to the ED with constitutional symptoms and exam with a clinically apparent thyroid goiter.  EKG unremarkable, but due to the somewhat changed appearance of the T wave inversions in the lateral leads, I added a troponin for screening purposes which is normal.  Chemistry panel is also unremarkable except for low potassium of 2.5 which was replaced orally in the ED.  Vital signs are normal, she is not  tachycardic or febrile, she is in a normal sinus rhythm.  TSH was 0.27 with a free T4 of 1.25.  Patient given methimazole and atenolol initial doses in the ED, and I will continue her on these medications until she can follow-up with her endocrinology referral that has been scheduled for next week.  Not in thyroid storm, overall nontoxic appearing.      ____________________________________________   FINAL CLINICAL IMPRESSION(S) / ED DIAGNOSES  Final diagnoses:  Hyperthyroidism  Goiter diffuse     ED Discharge Orders         Ordered    methimazole (TAPAZOLE) 10 MG tablet  Daily     10/09/19 1908    atenolol (TENORMIN) 25 MG tablet  Daily     10/09/19 1908          Portions of this note were generated with dragon dictation software. Dictation errors may occur despite best attempts at proofreading.   Carrie Mew, MD 10/09/19 [phone]

## 2019-10-09 NOTE — ED Notes (Signed)
Gwen from lab confirms T4, TSH, and Trop add-ons are currently running and should be done soon.

## 2019-10-09 NOTE — ED Notes (Signed)
Patient presents to the first nurse desk asking about wait times.  Informed patient that she had been called 3 times between 1400 and 1420.  Patient states she did not hear her name called.  Continues to wait to be seen.  Will be triaged next.

## 2019-10-10 ENCOUNTER — Other Ambulatory Visit: Payer: Self-pay | Admitting: Cardiovascular Disease

## 2019-10-10 LAB — HEMOGLOBIN A1C
Est. average glucose Bld gHb Est-mCnc: 126 mg/dL
Hgb A1c MFr Bld: 6 % — ABNORMAL HIGH (ref 4.8–5.6)

## 2019-10-10 LAB — BASIC METABOLIC PANEL
BUN/Creatinine Ratio: 11 (ref 9–23)
BUN: 10 mg/dL (ref 6–24)
CO2: 28 mmol/L (ref 20–29)
Calcium: 9.3 mg/dL (ref 8.7–10.2)
Chloride: 101 mmol/L (ref 96–106)
Creatinine, Ser: 0.87 mg/dL (ref 0.57–1.00)
GFR calc Af Amer: 94 mL/min/{1.73_m2} (ref >59)
GFR calc non Af Amer: 82 mL/min/{1.73_m2} (ref >59)
Glucose: 97 mg/dL (ref 65–99)
Potassium: 3.1 mmol/L — ABNORMAL LOW (ref 3.5–5.2)
Sodium: 141 mmol/L (ref 134–144)

## 2019-10-10 LAB — MAGNESIUM: Magnesium: 2.1 mg/dL (ref 1.6–2.3)

## 2019-10-11 ENCOUNTER — Emergency Department
Admission: EM | Admit: 2019-10-11 | Discharge: 2019-10-11 | Disposition: A | Discharge status: Home / Self Care | Payer: BC Managed Care – PPO | Attending: Emergency Medicine | Admitting: Emergency Medicine

## 2019-10-11 ENCOUNTER — Emergency Department: Payer: BC Managed Care – PPO

## 2019-10-11 ENCOUNTER — Other Ambulatory Visit: Payer: Self-pay

## 2019-10-11 ENCOUNTER — Encounter: Payer: Self-pay | Admitting: Emergency Medicine

## 2019-10-11 DIAGNOSIS — R0602 Shortness of breath: Secondary | ICD-10-CM | POA: Insufficient documentation

## 2019-10-11 DIAGNOSIS — E049 Nontoxic goiter, unspecified: Secondary | ICD-10-CM | POA: Diagnosis not present

## 2019-10-11 DIAGNOSIS — I10 Essential (primary) hypertension: Secondary | ICD-10-CM | POA: Diagnosis not present

## 2019-10-11 DIAGNOSIS — F17210 Nicotine dependence, cigarettes, uncomplicated: Secondary | ICD-10-CM | POA: Insufficient documentation

## 2019-10-11 DIAGNOSIS — Z79899 Other long term (current) drug therapy: Secondary | ICD-10-CM | POA: Insufficient documentation

## 2019-10-11 DIAGNOSIS — Z20822 Contact with and (suspected) exposure to covid-19: Secondary | ICD-10-CM | POA: Insufficient documentation

## 2019-10-11 LAB — CBC
HCT: 41 % (ref 36.0–46.0)
Hemoglobin: 13.8 g/dL (ref 12.0–15.0)
MCH: 28.3 pg (ref 26.0–34.0)
MCHC: 33.7 g/dL (ref 30.0–36.0)
MCV: 84.2 fL (ref 80.0–100.0)
Platelets: 266 10*3/uL (ref 150–400)
RBC: 4.87 MIL/uL (ref 3.87–5.11)
RDW: 13.3 % (ref 11.5–15.5)
WBC: 6.4 10*3/uL (ref 4.0–10.5)
nRBC: 0 % (ref 0.0–0.2)

## 2019-10-11 LAB — BASIC METABOLIC PANEL
Anion gap: 11 (ref 5–15)
BUN: 14 mg/dL (ref 6–20)
CO2: 27 mmol/L (ref 22–32)
Calcium: 9.1 mg/dL (ref 8.9–10.3)
Chloride: 102 mmol/L (ref 98–111)
Creatinine, Ser: 1 mg/dL (ref 0.44–1.00)
GFR calc Af Amer: >60 mL/min (ref >60)
GFR calc non Af Amer: >60 mL/min (ref >60)
Glucose, Bld: 88 mg/dL (ref 70–99)
Potassium: 3 mmol/L — ABNORMAL LOW (ref 3.5–5.1)
Sodium: 140 mmol/L (ref 135–145)

## 2019-10-11 LAB — HEPATIC FUNCTION PANEL
ALT: 20 U/L (ref 0–44)
AST: 20 U/L (ref 15–41)
Albumin: 3.9 g/dL (ref 3.5–5.0)
Alkaline Phosphatase: 51 U/L (ref 38–126)
Bilirubin, Direct: 0.1 mg/dL (ref 0.0–0.2)
Indirect Bilirubin: 0.6 mg/dL (ref 0.3–0.9)
Total Bilirubin: 0.7 mg/dL (ref 0.3–1.2)
Total Protein: 7.3 g/dL (ref 6.5–8.1)

## 2019-10-11 LAB — URINALYSIS, COMPLETE (UACMP) WITH MICROSCOPIC
Bilirubin Urine: NEGATIVE
Glucose, UA: NEGATIVE mg/dL
Ketones, ur: NEGATIVE mg/dL
Leukocytes,Ua: NEGATIVE
Nitrite: NEGATIVE
Protein, ur: NEGATIVE mg/dL
RBC / HPF: >50 RBC/hpf — ABNORMAL HIGH (ref 0–5)
Specific Gravity, Urine: 1.018 (ref 1.005–1.030)
pH: 6 (ref 5.0–8.0)

## 2019-10-11 LAB — LIPASE, BLOOD: Lipase: 23 U/L (ref 11–51)

## 2019-10-11 LAB — FIBRIN DERIVATIVES D-DIMER (ARMC ONLY): Fibrin derivatives D-dimer (ARMC): 690.61 ng/mL (FEU) — ABNORMAL HIGH (ref 0.00–499.00)

## 2019-10-11 LAB — TROPONIN I (HIGH SENSITIVITY): Troponin I (High Sensitivity): 9 ng/L (ref <18)

## 2019-10-11 MED ORDER — IOHEXOL 350 MG/ML SOLN
75.0000 mL | Freq: Once | INTRAVENOUS | Status: AC | PRN
  Administered 2019-10-11: 75 mL via INTRAVENOUS

## 2019-10-11 MED ORDER — SODIUM CHLORIDE 0.9% FLUSH: 3.0000 mL | Freq: Once | INTRAVENOUS | Status: DC

## 2019-10-11 NOTE — ED Provider Notes (Signed)
[facility] Emergency Department Provider Note  ____________________________________________  Time seen: Approximately 5:24 PM  I have reviewed the triage vital signs and the nursing notes.   HISTORY  Chief Complaint Shortness of Breath    HPI Bianca Acosta is a 44 y.o. female who presents the emergency department complaining of chest pressure, epigastric pressure, shortness of breath.  Patient was evaluated in this department 2 days ago complaining of fullness in the upper abdomen, feeling agitated and difficulty sleeping.  Patient was found to have a low TSH, hypokalemia.  Otherwise exam was reassuring.  Patient states that she has been taking her potassium, Synthroid as prescribed.  Patient states that she is experiencing a shortness of breath sensation as well as pressure in the chest and upper abdomen.  Patient denies any frank pain, describes it however as a tight sensation.  No headache, visual changes, neck pain, nausea, vomiting, diarrhea or constipation.  Patient has a history of SVT, sleep apnea, hypertension, brain aneurysm.  Patient states that she has not been using her CPAP as she had a problem with her machine.        Past Medical History:  Diagnosis Date  . Brain aneurysm 06/29/2015  . Chicken pox   . Headache   . Hypertension   . Sleep apnea 10/2018  . SVT (supraventricular tachycardia) [facility])     Patient Active Problem List   Diagnosis Date Noted  . URI (upper respiratory infection) 07/19/2019  . OSA (obstructive sleep apnea) 12/25/2018  . Intramural and subserous leiomyoma of uterus 10/25/2018  . Hives 07/23/2016  . Right knee pain 05/18/2016  . Morbid obesity with BMI of 40.0-44.9, adult ([place]) 04/23/2016  . HTN (hypertension) 04/23/2016  . Encounter for preventative adult health care exam with abnormal findings 04/23/2016  . 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 CRANIOTOMY Right 06/29/2015   Procedure: CRANIOTOMY INTRACRANIAL ANEURYSM CLIPPING;  Surgeon: Kevan Ny Ditty, MD;  Location: [place] NEURO ORS;  Service: Neurosurgery;  Laterality: Right;  . IR GENERIC HISTORICAL  05/25/2016   IR ANGIO INTRA EXTRACRAN SEL INTERNAL CAROTID BILAT MOD SED 05/25/2016 Consuella Lose, MD [facility]INTER[facility]  . IR GENERIC HISTORICAL  06/29/2015   IR ANGIO INTRA EXTRACRAN SEL INTERNAL CAROTID BILAT MOD SED 06/29/2015 Consuella Lose, MD [facility]  . IR GENERIC HISTORICAL  06/29/2015   IR ANGIO VERTEBRAL SEL VERTEBRAL UNI L MOD SED 06/29/2015 Consuella Lose, MD [facility]  . IR GENERIC HISTORICAL  06/29/2015   IR 3D INDEPENDENT WKST 06/29/2015 Consuella Lose, MD [facility]    Prior to Admission medications   Medication Sig Start Date End Date Taking? Authorizing Provider  albuterol (PROVENTIL HFA;VENTOLIN HFA) 108 (90 Base) MCG/ACT inhaler Inhale 2 puffs into the lungs every 6 (six) hours as needed for wheezing or shortness of breath. 08/29/18   Jodelle Green, FNP  atenolol (TENORMIN) 25 MG tablet Take 1 tablet (25 mg total) by mouth daily. 10/09/19 11/08/19  Carrie Mew, MD  carvedilol (COREG) 12.5 MG tablet Take 1 tablet (12.5 mg total) by mouth 2 (two) times daily. 08/27/19 11/25/19  Marrianne Mood D, PA-C  chlorthalidone (HYGROTON) 25 MG tablet Take 1 tablet (25 mg total) by mouth daily. 10/01/19 12/30/19  Marrianne Mood D, PA-C  cholecalciferol (VITAMIN D3) 25 MCG (1000 UT) tablet Take 1 tablet (1,000 Units total) by mouth daily. 09/26/18   Guse,  Jacquelynn Cree, FNP  methimazole (TAPAZOLE) 10 MG tablet Take 1 tablet (10 mg total) by mouth daily. 10/09/19   Carrie Mew, MD  potassium chloride (KLOR-CON) 10 MEQ tablet Take 1 tablet (10 mEq total) by mouth daily for 5 days. 10/01/19 10/09/19  Marrianne Mood D, PA-C  Probiotic Product (FORTIFY PROBIOTIC WOMENS EX ST) CPDR Take 1 capsule by  mouth daily. 09/17/18   Jodelle Green, FNP  spironolactone (ALDACTONE) 25 MG tablet Take 1 tablet (25 mg total) by mouth daily. 10/01/19 12/30/19  Marrianne Mood D, PA-C    Allergies Amoxicillin, Augmentin [amoxicillin-pot clavulanate], Ciprofloxacin, and Clavulanic acid  Family History  Problem Relation Age of Onset  . Hypertension Mother   . Hypertension Father   . Hypertension Maternal Aunt   . Hypertension Maternal Uncle   . Hypertension Paternal Aunt   . Hypertension Paternal Uncle   . Hypertension Maternal Grandmother   . Hypertension Maternal Grandfather   . Hypertension Paternal Grandmother   . Hypertension Paternal Grandfather     Social History Social History   Tobacco Use  . Smoking status: Current Every Day Smoker    Packs/day: 0.50    Years: 22.00    Pack years: 11.00  . Smokeless tobacco: Never Used  . Tobacco comment: 1 pack lasts a day or two  Substance Use Topics  . Alcohol use: No  . Drug use: No     Review of Systems  Constitutional: No fever/chills Eyes: No visual changes. No discharge ENT: No upper respiratory complaints. Cardiovascular: Chest pressure Respiratory: no cough.  Positive SOB. Gastrointestinal: Epigastric pressure but no other abdominal pain.  No nausea, no vomiting.  No diarrhea.  No constipation. Genitourinary: Negative for dysuria. No hematuria Musculoskeletal: Negative for musculoskeletal pain. Skin: Negative for rash, abrasions, lacerations, ecchymosis. Neurological: Negative for headaches, focal weakness or numbness. 10-point ROS otherwise negative.  ____________________________________________   PHYSICAL EXAM:  VITAL SIGNS: ED Triage Vitals  Enc Vitals Group     BP 10/11/19 1458 125/78     Pulse Rate 10/11/19 1458 76     Resp 10/11/19 1458 18     Temp 10/11/19 1458 98.7 F (37.1 C)     Temp Source 10/11/19 1458 Oral     SpO2 10/11/19 1458 96 %     Weight 10/11/19 1459 244 lb 14.9 oz (111.1 kg)     Height  10/11/19 1459 5\' 3"  (1.6 m)     Head Circumference --      Peak Flow --      Pain Score 10/11/19 1459 0     Pain Loc --      Pain Edu? --      Excl. in [place]? --      Constitutional: Alert and oriented. Well appearing and in no acute distress. Eyes: Conjunctivae are normal. PERRL. EOMI. Head: Atraumatic. ENT:      Ears:       Nose: No congestion/rhinnorhea.      Mouth/Throat: Mucous membranes are moist.  Neck: No stridor.  Neck is supple full range of motion Hematological/Lymphatic/Immunilogical: No cervical lymphadenopathy. Cardiovascular: Normal rate, regular rhythm. Normal S1 and S2.  Good peripheral circulation. Respiratory: Normal respiratory effort without tachypnea or retractions. Lungs CTAB. Good air entry to the bases with no decreased or absent breath sounds. Gastrointestinal: Bowel sounds 4 quadrants. Soft to palpation all quadrants.  Patient is mildly tender to palpation epigastric region.. No guarding or rigidity. No palpable masses. No distention. No CVA tenderness. Musculoskeletal: Full range  of motion to all extremities. No gross deformities appreciated. Neurologic:  Normal speech and language. No gross focal neurologic deficits are appreciated.  Skin:  Skin is warm, dry and intact. No rash noted. Psychiatric: Mood and affect are normal. Speech and behavior are normal. Patient exhibits appropriate insight and judgement.   ____________________________________________   LABS (all labs ordered are listed, but only abnormal results are displayed)  Labs Reviewed  BASIC METABOLIC PANEL - Abnormal; Notable for the following components:      Result Value   Potassium 3.0 (*)    All other components within normal limits  URINALYSIS, COMPLETE (UACMP) WITH MICROSCOPIC - Abnormal; Notable for the following components:   Color, Urine YELLOW (*)    APPearance HAZY (*)    Hgb urine dipstick LARGE (*)    RBC / HPF >50 (*)    Bacteria, UA RARE (*)    All other components  within normal limits  FIBRIN DERIVATIVES D-DIMER ([facility] ONLY) - Abnormal; Notable for the following components:   Fibrin derivatives D-dimer ([facility]) 690.61 (*)    All other components within normal limits  SARS CORONAVIRUS 2 (TAT 6-24 HRS)  CBC  HEPATIC FUNCTION PANEL  LIPASE, BLOOD  TROPONIN I (HIGH SENSITIVITY)   ____________________________________________  EKG   ____________________________________________  RADIOLOGY I personally viewed and evaluated these images as part of my medical decision making, as well as reviewing the written report by the radiologist.  DG Chest 1 View  Result Date: 10/11/2019 CLINICAL DATA:  44 year old female with shortness of breath. EXAM: CHEST  1 VIEW COMPARISON:  Chest radiograph dated 07/12/2019. FINDINGS: The heart size and mediastinal contours are within normal limits. Both lungs are clear. The visualized skeletal structures are unremarkable. IMPRESSION: No active disease. Electronically Signed   By: Anner Crete M.D.   On: 10/11/2019 18:07    ____________________________________________    PROCEDURES  Procedure(s) performed:    Procedures    Medications  sodium chloride flush (NS) 0.9 % injection 3 mL (has no administration in time range)     ____________________________________________   INITIAL IMPRESSION / ASSESSMENT AND PLAN / ED COURSE  Pertinent labs & imaging results that were available during my care of the patient were reviewed by me and considered in my medical decision making (see chart for details).  Review of the [place] CSRS was performed in accordance of the [place] prior to dispensing any controlled drugs.          Patient presented to the emergency department complaining of shortness of breath, pleuritic pain, epigastric pain.  Patient states that she has been seen for slightly different symptoms 2 days ago in the emergency department.  It was noted that her TSH was low, and patient was started on Synthroid  medication.  Patient states that she has a cardiologist and is being referred to [facility] cardiology for evaluation of paroxysmal SVT.  Patient states that she developed pleuritic pain, a pressure-like sensation in the lower bilateral chest as well as some epigastric pressure.  No palpitations.  No URI symptoms.  No fevers or chills.  Patient has had no nausea, vomiting or diarrhea.  Initial exam was reassuring with patient being minimally tender in the epigastric region with no other significant exam findings.  Patient's labs are stable when compared to previous labs from 2 days ago.  Given the symptoms I did draw a D-dimer.  This returns positive.  Currently patient is to have PE chest for further evaluation.  Remainder of work-up is  reassuring.  Patient care will be transferred to attending provider, Dr. Darl Householder for final diagnosis and disposition.     This chart was dictated using voice recognition software/Dragon. Despite best efforts to proofread, errors can occur which can change the meaning. Any change was purely unintentional.    Darletta Moll, PA-C 10/11/19 2120    Drenda Freeze, MD 10/13/19 [phone]

## 2019-10-11 NOTE — ED Triage Notes (Signed)
PT to ER states that "When I inhale something cuts it short.  It is hard for me to get a breath in."  PT states was seen here several days ago for same type symptoms, that subsided, but have now returned.

## 2019-10-11 NOTE — Discharge Instructions (Addendum)
Your CT scan is normal today. You have a goiter that has been there.   See your doctor  Return to ER if you have worse shortness of breath, chest pain, fever

## 2019-10-11 NOTE — ED Notes (Signed)
Lab called, blue top present, d dimer running now

## 2019-10-11 NOTE — ED Notes (Signed)
Peripheral IV discontinued. Catheter intact. No signs of infiltration or redness. Gauze applied to IV site.   Discharge instructions reviewed with patient. Questions fielded by this RN. Patient verbalizes understanding of instructions. Patient discharged home in stable condition per Darl Householder. No acute distress noted at time of discharge.   Pt ambulatory to DC

## 2019-10-12 ENCOUNTER — Telehealth: Payer: Self-pay

## 2019-10-12 DIAGNOSIS — E876 Hypokalemia: Secondary | ICD-10-CM

## 2019-10-12 LAB — POCT PREGNANCY, URINE: Preg Test, Ur: NEGATIVE

## 2019-10-12 LAB — SARS CORONAVIRUS 2 (TAT 6-24 HRS): SARS Coronavirus 2: NEGATIVE

## 2019-10-12 MED ORDER — POTASSIUM CHLORIDE ER 10 MEQ PO TBCR: 10.0000 meq | EXTENDED_RELEASE_TABLET | Freq: Every day | ORAL | 0 refills | Status: DC

## 2019-10-12 NOTE — Telephone Encounter (Signed)
Noted. Thank you for keeping me in the loop.

## 2019-10-12 NOTE — Telephone Encounter (Signed)
She should not take the atenolol. I am uncertain why the ED doctor would prescribe a beta blocker on top of a beta blocker and will message him and discontinue this medication. Please notify her pharmacy as well. She should not have been prescribed this at the same time.   Suspect her headache is due to her untreated sleep apnea and suggest she follow-up with her pulmonologist. Since she has been tolerating Coreg for a month now, I have low suspicion that this is the etiology of her headaches. Given her SVT, we could consider metoprolol or diltiazem. I would recommend the diltiazem given her SVT once she gets her update echo to ensure normal pump function of her heart.

## 2019-10-12 NOTE — Telephone Encounter (Signed)
Call to patient to discuss results and POC.   Spoke to patient regarding K cl. The ED gave her 49 mEg which she has been taking daily. I sent in refill so she can start taking 20 mEq starting today. [facility].   She also has a question about updated meds. She is currently taking coreg and is having headache. Pt thinks they may be related.   Has not started atenolol bc pharmacist said it was contraindicated. Pt reports she took atenolol while in the hospital, did well on it. She feels like the doctor told her atenolol was better for thyroid issues. Pt prefers atenolol but wants to make sure that is okay with cardiologist.   Bianca Acosta to Elenor Quinones, PA for further advice.

## 2019-10-12 NOTE — Telephone Encounter (Signed)
-----   Message from Arvil Chaco, PA-C sent at 10/11/2019 10:49 PM EST ----- Regarding: Reece Packer Potassium FYI - please disregard the first result note on this patient. I do not know how to delete it. If she did not get any potassium in the ED on 2/28, she needs Kcl tab 19mEq daily (daily, not BID) for 5 days or the equivalent in powder as she has not tolerated the potassium tab in the past.  ----- Message ----- From: Interface, [facility] Lab Results In Sent: 10/10/2019   5:37 AM EST To: Arvil Chaco, PA-C

## 2019-10-12 NOTE — Telephone Encounter (Signed)
-----   Message from Arvil Chaco, PA-C sent at 10/11/2019 10:49 PM EST ----- Regarding: Reece Packer Potassium FYI - please disregard the first result note on this patient. I do not know how to delete it. If she did not get any potassium in the ED on 2/28, she needs Kcl tab 3mEq daily (daily, not BID) for 5 days or the equivalent in powder as she has not tolerated the potassium tab in the past.  ----- Message ----- From: Interface, [facility] Lab Results In Sent: 10/10/2019   5:37 AM EST To: Arvil Chaco, PA-C

## 2019-10-13 ENCOUNTER — Telehealth: Payer: Self-pay | Admitting: Physician Assistant

## 2019-10-13 NOTE — Telephone Encounter (Signed)
Returned call to patient. Discussed recent admissions, CT scan, UA, recent labs / hypokalemia, thyroid function, uterine fibroids, SVT, and elevated blood pressure. She reports today that she only received a atenolol at the time of her emergency department visit.  She was prescribed atenolol in the ED; however, when she went to fill it after discharge from the ED, the [facility] pharmacist informed her that she was on 2 beta-blockers and had reportedly called the emergency department and spoke with the prescribing MD, who then canceled the prescription for atenolol.  She reports that she felt well the day of her discharge; however, shortly thereafter, she began to feel ill again, which is the reason she presented to the emergency department on Sunday. She is worried it ma be her Coreg. We discussed that she was feeling well on it the past month or so, as noted when we had our telemedicine visit. However, we could certainly explore other options to ascertain if she does not tolerate Coreg. We discussed metoprolol, as this is also recommended for SVT, as well as thyroid issues. Reviewed that this will not be as effective in lowering her BP as Coreg; however, we could certainly adjust her medications otherwise to get BP control as well if she tolerated metoprolol. Given the patient's concern for side effects, we then reviewed the most common side effects associated with other rate control agents and with patient preference to then continue Coreg over other options at this time.She will let us know if she changes her mind. Given her resistant HTN and most recent labs, I encouraged her to monitor her BP. During our phone call, she took her blood pressure and reported BP 159/92, which I informed her is elevated.  Given this blood pressure reading, instructed patient to continue to monitor her blood pressure at home with daily blood pressure measurements and at least twice a day (morning and night), as well as 2 hours after her  medications.  She is agreeable to this plan and will continue to 'monitor her blood pressure.  In addition, encouraged her to ensure that she makes her Thursday lab to reassess her potassium. She is taking her potassium supplementation, which I reviewed is very important, as low K can lead to harmful arrhythmias. Given her ongoing hypokalemia on recent labs, anticipate that she will need ongoing potassium supplementation.  We discussed cancelling her upcoming EP appointment given her ongoing comorbid conditions, including thyroid issues, which likely have contributed to her SVT. She is agreeable to this and wishes to focus on endocrine and GI referrals (as provided at previous appointments).   In summary: (1) Continue Coreg. (2) Remove atenolol from medication list. (3) BMET/Mg Thursday -reassess hypokalemia and renal function. (4) Stressed importance of potassium supplementation. She understands to not skip doses. (5) Monitor home BP. Further recommendations for antihypertensives /BB options at that time. (6) Cancel EP appointment given ongoing thyroid workup.   Patient agreeable to above plan and thankful for the call. All questions answered with patient understanding

## 2019-10-13 NOTE — Telephone Encounter (Signed)
Pt c/o medication issue:  1. Name of Medication: carvedilol  2. How are you currently taking this medication (dosage and times per day)? 12.5 mg bid  3. Are you having a reaction (difficulty breathing--STAT)? no  4. What is your medication issue? Trying to rule out issues that she has previously been told was her thyroid  Patient wanting to know if she can stop carvedilol and just take the atenolol.  Please advise when able. Patient will be at work and if misses the call will try to call back quickly

## 2019-10-13 NOTE — Telephone Encounter (Signed)
Pt reports that she is feeling groggy and bloated. She knows that thyroid levels are off but felt so much better when she was hospitalized, she took atenolol and felt much better.   Pt expresses that she would really like to try atenolol in place of coreg.   Ref has been made endocrine for thyroid issues. appt has not been made at this time despite pt attempt.   Advised pt to call for any further questions or concerns.

## 2019-10-14 NOTE — Telephone Encounter (Signed)
Call completed with Elenor Quinones, PA.

## 2019-10-14 NOTE — Addendum Note (Signed)
Addended by: Verlon Au on: 10/14/2019 01:21 PM   Modules accepted: Orders

## 2019-10-14 NOTE — Addendum Note (Signed)
Addended by: Verlon Au on: 10/14/2019 01:42 PM   Modules accepted: Orders

## 2019-10-15 ENCOUNTER — Other Ambulatory Visit
Admission: RE | Admit: 2019-10-15 | Discharge: 2019-10-15 | Disposition: A | Discharge status: Home / Self Care | Payer: BC Managed Care – PPO | Attending: Physician Assistant | Admitting: Physician Assistant

## 2019-10-15 DIAGNOSIS — E876 Hypokalemia: Secondary | ICD-10-CM | POA: Diagnosis not present

## 2019-10-15 LAB — BASIC METABOLIC PANEL
Anion gap: 9 (ref 5–15)
BUN: 18 mg/dL (ref 6–20)
CO2: 29 mmol/L (ref 22–32)
Calcium: 9.5 mg/dL (ref 8.9–10.3)
Chloride: 102 mmol/L (ref 98–111)
Creatinine, Ser: 0.95 mg/dL (ref 0.44–1.00)
GFR calc Af Amer: >60 mL/min (ref >60)
GFR calc non Af Amer: >60 mL/min (ref >60)
Glucose, Bld: 130 mg/dL — ABNORMAL HIGH (ref 70–99)
Potassium: 3.1 mmol/L — ABNORMAL LOW (ref 3.5–5.1)
Sodium: 140 mmol/L (ref 135–145)

## 2019-10-16 ENCOUNTER — Telehealth: Payer: BC Managed Care – PPO | Admitting: Nurse Practitioner

## 2019-10-16 ENCOUNTER — Other Ambulatory Visit: Payer: Self-pay

## 2019-10-16 ENCOUNTER — Telehealth: Payer: Self-pay | Admitting: Nurse Practitioner

## 2019-10-16 ENCOUNTER — Encounter: Payer: Self-pay | Admitting: Nurse Practitioner

## 2019-10-16 ENCOUNTER — Telehealth: Payer: Self-pay | Admitting: Physician Assistant

## 2019-10-16 VITALS — Ht 63.0 in | Wt 242.0 lb

## 2019-10-16 DIAGNOSIS — G4733 Obstructive sleep apnea (adult) (pediatric): Secondary | ICD-10-CM | POA: Diagnosis not present

## 2019-10-16 DIAGNOSIS — Z6841 Body Mass Index (BMI) 40.0 and over, adult: Secondary | ICD-10-CM

## 2019-10-16 DIAGNOSIS — I10 Essential (primary) hypertension: Secondary | ICD-10-CM | POA: Diagnosis not present

## 2019-10-16 DIAGNOSIS — E049 Nontoxic goiter, unspecified: Secondary | ICD-10-CM | POA: Diagnosis not present

## 2019-10-16 DIAGNOSIS — E0590 Thyrotoxicosis, unspecified without thyrotoxic crisis or storm: Secondary | ICD-10-CM

## 2019-10-16 DIAGNOSIS — E876 Hypokalemia: Secondary | ICD-10-CM

## 2019-10-16 MED ORDER — POTASSIUM CHLORIDE ER 10 MEQ PO TBCR: 10.0000 meq | EXTENDED_RELEASE_TABLET | Freq: Two times a day (BID) | ORAL | 3 refills | Status: DC

## 2019-10-16 MED ORDER — LOSARTAN POTASSIUM 25 MG PO TABS: 25.0000 mg | ORAL_TABLET | Freq: Every day | ORAL | 6 refills | Status: DC

## 2019-10-16 NOTE — Progress Notes (Signed)
Virtual Visit via Video Note  I connected with@ on 10/16/19 at  4:00 PM EST by a video enabled telemedicine application and verified that I am speaking with the correct person using two identifiers. This visit type was conducted due to national recommendations for restrictions regarding the COVID-19 Pandemic (e.g. social distancing).  This format is felt to be most appropriate for this patient at this time.   I discussed the limitations of evaluation and management by telemedicine and the availability of in person appointments. The patient expressed understanding and agreed to proceed.  Only the patient and myself were on today's video visit. The patient was at home and I was in my office at the time of today's visit.   History of Present Illness:  This 44 year old is followed here for HTN and sleep apnea, uterine fibroids with last routine medical f/up 12/2018. She had a work-in sick visit 07/16/2019 for URI which was treated and resolved.   She is now her for ED  f/up after recent diagnosis 10/11/2019 of hyperthyroidism and goiter. She was not  in thyroid storm. She presented with constitutional sx and  Labs showed: EKG NSR,  normal troponin, TSH 0.27, free T4 1.25, K 2.5 supplemented in the ED. She returned to the ED with SOB, epigastric pain and anxiety. Cardiac and Pulmonary exams unremarkable. D-dimer mildly elevated. CTA negative for PE or PNA.    Hyperthyroidism/goiter:  She presents on methimazole 10 mg daily and has an ENDOCRINOLOGY appt scheduled. She is feeling much better and not a anxious. PHQ-9 is 2.   HTN:  medications were adjusted by Cardiology  To stop the chlorthalidone that she was taking, begin  losartan 25 mg daily,  Klor-con 10 mEq twice daily. She is also taking  spironolactone 25 mg daily and carvedilol 12.5 mg. She has a BMET ordered for next week by Cardiology, Dr. Fletcher Anon.  SOB/Sleep apnea: albuterol inhaler as needed. She is feeling well and has no specific complaints.     Review of Systems  Constitutional: Negative for appetite change, chills, fatigue and unexpected weight change.  HENT: Positive for postnasal drip and trouble swallowing.        Nasal stuffiness and uses Nasacort as needed- no problems now .  Enlarged thyroid on the left side.   Respiratory: Negative for cough, chest tightness and shortness of breath.  Not tolerating C-CPAP and sees Dr. Mortimer Fries for PULM Cardiovascular: Negative for chest pain and leg swelling. Seeing Dr. Fletcher Anon and PA Mickle Plumb Gastrointestinal: Negative for abdominal pain and nausea. She is going to be seeing a Copywriter, advertising. Genitourinary: Negative for difficulty urinating and frequency.  Musculoskeletal: Positive for back pain. Negative for arthralgias and joint swelling.       Occasional upper back hurts from large breast side  Skin: Negative for rash.  Neurological: Negative for tremors, weakness and light-headedness.  Hematological: Negative for adenopathy. Does not bruise/bleed easily.  Psychiatric/Behavioral:       No depression/anxiety concerns now. But she had  anxiety a few weeks ago when she had hyperthyroidism untreated. She is much better on Tapazole.      Observations/Objective: Gen: Awake, alert, no acute distress Resp: Breathing is even and non-labored Psych: calm/pleasant demeanor Neuro: Alert and Oriented x 3, + facial symmetry, speech is clear.  Assessment and Plan: New Dx of hyperthyroidism and goiter- continue methimazole and follow up with ENDO as planned. She is feeling much better.   HTN and SVT: Cardiology is managing and actively adjusting her  meds. She is on potassium sparing meds and Klor- Con- Bmet next week and Cardiology is following her.   Anxiety: Improved with control of her thyroid.   SOB: she had SOB with hyperthyroidism- better. Needs Sleeps apnea addressed and is followed by PULM. Morbid obesity noted to be contributing.   She had epigastric pain and a GI consult is in  process. No pain today.  Continue follow up with your specialist and we can do her CPE in June.   Follow Up Instructions:  I discussed the assessment and treatment plan with the patient. The patient was provided an opportunity to ask questions and all were answered. The patient agreed with the plan and demonstrated an understanding of the instructions.   The patient was advised to call back or seek an in-person evaluation if the symptoms worsen or if the condition fails to improve as anticipated.   I spent 20 min with her in face to face video.   Denice Paradise, NP

## 2019-10-16 NOTE — Telephone Encounter (Signed)
I spoke with Marrianne Mood, PA to clarify how soon the patient needed to follow up. Per Malachi Bonds, the patient has been non-compliant with potassium supplementation in the past.   I advised we could send in potassium 10 meq tablets to see if this will help with compliance.  Per Malachi Bonds, 1) Stop chlorthalidone 2) Start losartan 25 mg once daily 3) Start potassium 10 meq BID 4) BMP/ Mag in 1 week 5) follow up in clinic with Jacquelyn the week of 11/02/19  I attempted to call the patient. No answer- I left a message to please call back today.

## 2019-10-16 NOTE — Telephone Encounter (Signed)
Patient calling back. States she is home from work and free to talk   Please advise

## 2019-10-16 NOTE — Telephone Encounter (Signed)
Patient made aware of Marrianne Mood, PAs recommendation  1- STOP Chlorthalidone 2- START Losartan 25 mg qd. Rx sent to the patients pharmacy 3- Switch Potassium to 10 mEq bid (for better compliance) Rx sent to the patients pharmacy.  [facility]  Follow up appt with JV scheduled for 11/04/19 @ 9:30am.  Patient verbalized understanding to the instructions given. Patient is aware of the f/u appt date and time.

## 2019-10-16 NOTE — Telephone Encounter (Signed)
Called pt and let her know that I cancelled appt with Dr. Nicki Reaper on 3/15 b/c she is now a patient of Denice Paradise. If patient needs a follow up it needs to be with new pcp.

## 2019-10-16 NOTE — Telephone Encounter (Signed)
Arvil Chaco, PA-C  10/15/2019 4:26 PM EST    Please have Ms. Canche stop her chlorthalidone as her potassium remains low despite supplementation. Start Losartan 25mg  daily in its place. Continue Coreg and spironolactone. She should take KCL tab 50mEq x5 days and recheck a BMET in a week. We can also have her come into the office sooner than scheduled to recheck her BP with these changes and make sure that she does not need any further adjustments. I would like for her to bring her cuff and BP log at that time. (1) Stop chlorthalidone.  (2) Start Losartan 25mg  daily (3) Kcl tab 32mEq x5 days  (4) BMET/Mg in 1 week

## 2019-10-16 NOTE — Telephone Encounter (Signed)
Patient returning call working until 4 today

## 2019-10-22 NOTE — Telephone Encounter (Signed)
Attempted to call patient. LMTCB 10/22/2019   

## 2019-10-22 NOTE — Telephone Encounter (Signed)
Patient returning call .  She works [phone]

## 2019-10-23 NOTE — Telephone Encounter (Signed)
Spoke with the patient is response to her 10/21/19 mychart message.  Marrianne Mood D, PA-C  2:36 PM If she wants to come into clinic earlier than scheduled, we can do so!  Patient is scheduled for f/u on 11/04/19 with Jacquelyn. Appt moved up to 09/30/19 @ 10:05 am with Ignacia Bayley, NP. Patient complains of 8lb weight gain. She is taking her medications as prescribed. She does not feel as though she is voiding enough. She denies sob, swelling. She denies increased salt intake. Her BP on 10/21/19 103/63 77 bpm.   Advised her to continue daily weights and to also monitor her BP daily. Advised her to bring her readings with her to her upcoming appt. Pt verbalized understanding.

## 2019-10-26 ENCOUNTER — Ambulatory Visit: Payer: BC Managed Care – PPO | Admitting: Internal Medicine

## 2019-10-26 ENCOUNTER — Encounter: Payer: Self-pay | Admitting: Obstetrics and Gynecology

## 2019-10-26 ENCOUNTER — Other Ambulatory Visit: Payer: Self-pay

## 2019-10-26 ENCOUNTER — Ambulatory Visit (INDEPENDENT_AMBULATORY_CARE_PROVIDER_SITE_OTHER): Payer: BC Managed Care – PPO

## 2019-10-26 ENCOUNTER — Ambulatory Visit (INDEPENDENT_AMBULATORY_CARE_PROVIDER_SITE_OTHER): Payer: BC Managed Care – PPO | Admitting: Obstetrics and Gynecology

## 2019-10-26 VITALS — BP 130/82 | HR 72 | Wt 256.0 lb

## 2019-10-26 DIAGNOSIS — D250 Submucous leiomyoma of uterus: Secondary | ICD-10-CM

## 2019-10-26 DIAGNOSIS — D252 Subserosal leiomyoma of uterus: Secondary | ICD-10-CM

## 2019-10-26 DIAGNOSIS — D251 Intramural leiomyoma of uterus: Secondary | ICD-10-CM

## 2019-10-26 DIAGNOSIS — H4901 Third [oculomotor] nerve palsy, right eye: Secondary | ICD-10-CM | POA: Diagnosis not present

## 2019-10-26 NOTE — Progress Notes (Signed)
Gynecology Ultrasound Follow Up  Chief Complaint:  Chief Complaint  Patient presents with  . Follow-up    GYN ultrasound     History of Present Illness: Patient is a 44 y.o. female who presents today for ultrasound evaluation of uterine fibroids .  Ultrasound demonstrates the following findgins Adnexa: no adnexal masses Uterus:grossly enlarged with interval increase in the size and number of uterine fibroids Additional: no free fluid  Review of Systems: negative unless noted in HPI  Past Medical History:  Past Medical History:  Diagnosis Date  . Brain aneurysm 06/29/2015   a. s/p craniotomy.  . Chest pain    a. 09/2019 Cardiac CT: Ca2+ score = Zero.  . Chicken pox   . Headache   . History of echocardiogram    a. 12/2018 Echo: EF 60-65%. DD. Mild LAE.  Marland Kitchen Hypertension   . PSVT (paroxysmal supraventricular tachycardia) ([place])    a. 08/2019 Zio: Avg HR 84 (NSR). 4 beats WCT (likely SVT w/ aberrancy). 4 SVT episodes, longes 9.9 secs. Rare PACs/PVCs (<1%). Some triggered events = SVT and/or PVCs.  . Sleep apnea 10/2018  . Thyroid nodule greater than or equal to 1.5 cm in diameter incidentally noted on imaging study    a. 09/2019 CTA Chest: Ill-defined 2.5cm L thyroid nodule - rec f/u U/S.    Past Surgical History:  Past Surgical History:  Procedure Laterality Date  . CRANIOTOMY Right 06/29/2015   Procedure: CRANIOTOMY INTRACRANIAL ANEURYSM CLIPPING;  Surgeon: Kevan Ny Ditty, MD;  Location: [facility];  Service: Neurosurgery;  Laterality: Right;  . IR GENERIC HISTORICAL  05/25/2016   IR ANGIO INTRA EXTRACRAN SEL INTERNAL CAROTID BILAT MOD SED 05/25/2016 Consuella Lose, MD [facility]  . IR GENERIC HISTORICAL  06/29/2015   IR ANGIO INTRA EXTRACRAN SEL INTERNAL CAROTID BILAT MOD SED 06/29/2015 Consuella Lose, MD [facility]  . IR GENERIC HISTORICAL  06/29/2015   IR ANGIO VERTEBRAL SEL VERTEBRAL UNI L MOD SED 06/29/2015 Consuella Lose, MD [facility]  .  IR GENERIC HISTORICAL  06/29/2015   IR 3D INDEPENDENT WKST 06/29/2015 Consuella Lose, MD [facility]  . UTERINE FIBROID SURGERY  2020    Gynecologic History:  Patient's last menstrual period was 10/14/2019 (exact date). Last Pap: 09/18/2017 Results were: .ASCUS with NEGATIVE high risk HPV  Family History:  Family History  Problem Relation Age of Onset  . Hypertension Mother   . Hypertension Father   . Hypertension Maternal Aunt   . Hypertension Maternal Uncle   . Hypertension Paternal Aunt   . Hypertension Paternal Uncle   . Hypertension Maternal Grandmother   . Hypertension Maternal Grandfather   . Hypertension Paternal Grandmother   . Hypertension Paternal Grandfather     Social History:  Social History   Socioeconomic History  . Marital status: Single    Spouse name: Not on file  . Number of children: Not on file  . Years of education: Not on file  . Highest education level: Not on file  Occupational History  . Not on file  Tobacco Use  . Smoking status: Current Every Day Smoker    Packs/day: 0.50    Years: 22.00    Pack years: 11.00  . Smokeless tobacco: Never Used  . Tobacco comment: 1 pack lasts a day or two  Substance and Sexual Activity  . Alcohol use: Yes    Comment: ocassionally  . Drug use: No  . Sexual activity: Yes    Birth control/protection: None  Other Topics Concern  . Not on file  Social History Narrative  . Not on file   Social Determinants of Health   Financial Resource Strain:   . Difficulty of Paying Living Expenses:   Food Insecurity:   . Worried About Charity fundraiser in the Last Year:   . Arboriculturist in the Last Year:   Transportation Needs:   . Film/video editor (Medical):   Marland Kitchen Lack of Transportation (Non-Medical):   Physical Activity:   . Days of Exercise per Week:   . Minutes of Exercise per Session:   Stress:   . Feeling of Stress :   Social Connections:   . Frequency of Communication with Friends and  Family:   . Frequency of Social Gatherings with Friends and Family:   . Attends Religious Services:   . Active Member of Clubs or Organizations:   . Attends Archivist Meetings:   Marland Kitchen Marital Status:   Intimate Partner Violence:   . Fear of Current or Ex-Partner:   . Emotionally Abused:   Marland Kitchen Physically Abused:   . Sexually Abused:     Allergies:  Allergies  Allergen Reactions  . Amoxicillin   . Augmentin [Amoxicillin-Pot Clavulanate] Hives  . Ciprofloxacin Itching    Patient states she starts itching from inside or internal.  . Clavulanic Acid     Medications: Prior to Admission medications   Medication Sig Start Date End Date Taking? Authorizing Provider  albuterol (PROVENTIL HFA;VENTOLIN HFA) 108 (90 Base) MCG/ACT inhaler Inhale 2 puffs into the lungs every 6 (six) hours as needed for wheezing or shortness of breath. 08/29/18  Yes Guse, Jacquelynn Cree, FNP  carvedilol (COREG) 12.5 MG tablet Take 1 tablet (12.5 mg total) by mouth 2 (two) times daily. 10/12/19  Yes Wellington Hampshire, MD  cholecalciferol (VITAMIN D3) 25 MCG (1000 UT) tablet Take 1 tablet (1,000 Units total) by mouth daily. Patient not taking: Reported on 10/27/2019 09/26/18  Yes Guse, Jacquelynn Cree, FNP  methimazole (TAPAZOLE) 10 MG tablet Take 1 tablet (10 mg total) by mouth daily. 10/09/19  Yes Carrie Mew, MD  Probiotic Product (FORTIFY PROBIOTIC WOMENS EX ST) CPDR Take 1 capsule by mouth daily. 09/17/18  Yes Guse, Jacquelynn Cree, FNP  spironolactone (ALDACTONE) 25 MG tablet Take 1 tablet (25 mg total) by mouth daily. 10/01/19 12/30/19 Yes Visser, Jacquelyn D, PA-C  omeprazole (PRILOSEC) 20 MG capsule Take 1 capsule (20 mg total) by mouth daily. 10/28/19 11/27/19  Virgel Manifold, MD  polyethylene glycol powder (GLYCOLAX/MIRALAX) 17 GM/SCOOP powder Take 255 g by mouth daily. 10/28/19 11/27/19  Virgel Manifold, MD  potassium chloride (KLOR-CON) 10 MEQ tablet Take 1 tablet (10 mEq total) by mouth 2 (two) times daily for 5  days. 10/16/19 10/27/19  Marrianne Mood D, PA-C  triamterene-hydrochlorothiazide (DYAZIDE) 37.5-25 MG capsule Take 1 each (1 capsule total) by mouth daily. 10/28/19   Theora Gianotti, NP    Physical Exam Vitals: Blood pressure 130/82, pulse 72, weight 256 lb (116.1 kg), last menstrual period 10/14/2019.  General: NAD HEENT: normocephalic, anicteric Pulmonary: No increased work of breathing Extremities: no edema, erythema, or tenderness Neurologic: Grossly intact, normal gait Psychiatric: mood appropriate, affect full  DG Chest 1 View  Result Date: 10/11/2019 CLINICAL DATA:  44 year old female with shortness of breath. EXAM: CHEST  1 VIEW COMPARISON:  Chest radiograph dated 07/12/2019. FINDINGS: The heart size and mediastinal contours are within normal limits. Both lungs are clear. The visualized skeletal  structures are unremarkable. IMPRESSION: No active disease. Electronically Signed   By: Anner Crete M.D.   On: 10/11/2019 18:07   CT Angio Chest PE W and/or Wo Contrast  Result Date: 10/11/2019 CLINICAL DATA:  44 year old female with chest pressure and epigastric pain and shortness of breath. EXAM: CT ANGIOGRAPHY CHEST WITH CONTRAST TECHNIQUE: Multidetector CT imaging of the chest was performed using the standard protocol during bolus administration of intravenous contrast. Multiplanar CT image reconstructions and MIPs were obtained to evaluate the vascular anatomy. CONTRAST:  42mL OMNIPAQUE IOHEXOL 350 MG/ML SOLN COMPARISON:  Chest radiograph dated 10/11/2019. FINDINGS: Cardiovascular: Mild cardiomegaly. No pericardial effusion. The thoracic aorta is unremarkable. The origins of the great vessels of the aortic arch appear patent as visualized. Evaluation of the pulmonary arteries is limited due to suboptimal opacification and timing of the contrast. No pulmonary artery embolus identified. Mediastinum/Nodes: There is no hilar or mediastinal adenopathy. The esophagus is grossly  unremarkable. There is an ill-defined 2.5 cm left thyroid nodule. Further evaluation with ultrasound on a nonemergent basis recommended. No mediastinal fluid collection. Lungs/Pleura: No focal consolidation, pleural effusion, or pneumothorax. The central airways are patent. Upper Abdomen: Possible tiny gallbladder stone. Indeterminate 2.7 x 2.5 cm soft tissue in the superior spleen. Further evaluation with ultrasound on a nonemergent/outpatient basis recommended. Musculoskeletal: No chest wall abnormality. No acute or significant osseous findings. Review of the MIP images confirms the above findings. IMPRESSION: 1. No acute intrathoracic pathology. No CT evidence of pulmonary embolism. 2. Ill-defined 2.5 cm left thyroid nodule. Further evaluation with ultrasound on a nonemergent basis recommended. 3. Indeterminate soft tissue in the superior spleen. Further evaluation with ultrasound on a nonemergent/outpatient basis recommended. Electronically Signed   By: Anner Crete M.D.   On: 10/11/2019 22:13   US PELVIS TRANSVAGINAL NON-OB (TV ONLY)  Result Date: 10/26/2019 Patient Name: DESHAY KALTENBACH DOB: Oct 16, 1975 MRN: [id number] ULTRASOUND REPORT Location: [facility] Date of Service: 10/26/2019 Indications: Fibroids Findings: The uterus is anteverted and measures 12.3 x 8.8 x 7.9 cm. Echo texture is heterogenous with evidence of focal masses. Within the uterus are multiple suspected fibroids measuring: Fibroid 1:  21.4 x 15.6 x 15.4 mm greater than 50 % submucosal Fibroid 2: 17.2 x 17.0 x 16.9 mm less than 50 % submucosal Fibroid 3: 19.1 x 11.9 x 17.7 mm up to  50% submucosal Fibroid 4: 10.7 x 9.7 x 11.1 mm less than 50% submucosal Fibroid 5: 37.6 x 33.3 x 37.9 mm intramural fundal, calcified Fibroid 6: 50.2 x 29.1 x 42.5 mm intramural right Fibroid 7: 31.2 x 26.3 x 24.0 mm subserosal left Fibroid 8: 25.4 x 22.6 x 28.9 mm. Left adnexa/possible broad ligament The Endometrium measures 11.1 mm. Right Ovary  measures 4.8 x 1.9 x 1.8 cm. It is normal in appearance. Left Ovary measures 3.4 x 2.5 x 1.6 cm. It is normal in appearance. Survey of the adnexa demonstrates no adnexal masses. There is no free fluid in the cul de sac. Impression: 1. There are multiple uterine fibroids, to numerous to count. The largest ones and the ones touching the endometrium were measured. 2. Normal  appearing ovaries. Recommendations: 1.Clinical correlation with the patient's History and Physical Exam. Gweneth Dimitri, RT Multiple uterine fibroids are imaged on today's study.  The larges of which measures 50.2 x 29.1 x 42.70mm.  Some of these fibroids to appear to have submucosal components.  The adnexa image normally.  The endometrial strip is normal in appearance and homogenous Malachy Mood,  MD, [facility], [facility]     Assessment: 44 y.o. [phone]   Plan: Problem List Items Addressed This Visit    None    Visit Diagnoses    Intramural, submucous, and subserous leiomyoma of uterus    -  Primary      1) Fibroid - multiple uterine fibroids noted on today's study.  The patient is interested in hysterectomy and would prefer minimally invasive options if possible.  Discussed referral to [facility] program.  However the patient is currently being worked up for hyperthyroidism and would like to get this work up completed prior to proceeding.  Will contact office once she is ready for referral  2) A total of 15 minutes were spent in face-to-face contact with the patient during this encounter with over half of that time devoted to counseling and coordination of care.  3) Return in about 1 year (around 10/25/2020) for annual.    Malachy Mood, MD, [facility], [place]

## 2019-10-27 ENCOUNTER — Other Ambulatory Visit: Payer: Self-pay

## 2019-10-27 ENCOUNTER — Encounter: Payer: Self-pay | Admitting: Gastroenterology

## 2019-10-28 ENCOUNTER — Ambulatory Visit (INDEPENDENT_AMBULATORY_CARE_PROVIDER_SITE_OTHER): Payer: BC Managed Care – PPO | Admitting: Gastroenterology

## 2019-10-28 ENCOUNTER — Encounter: Payer: Self-pay | Admitting: Gastroenterology

## 2019-10-28 ENCOUNTER — Encounter: Payer: Self-pay | Admitting: Nurse Practitioner

## 2019-10-28 ENCOUNTER — Ambulatory Visit (INDEPENDENT_AMBULATORY_CARE_PROVIDER_SITE_OTHER): Payer: BC Managed Care – PPO | Admitting: Nurse Practitioner

## 2019-10-28 VITALS — BP 150/98 | HR 71 | Ht 63.0 in | Wt 256.4 lb

## 2019-10-28 DIAGNOSIS — R932 Abnormal findings on diagnostic imaging of liver and biliary tract: Secondary | ICD-10-CM

## 2019-10-28 DIAGNOSIS — R1013 Epigastric pain: Secondary | ICD-10-CM

## 2019-10-28 DIAGNOSIS — E0590 Thyrotoxicosis, unspecified without thyrotoxic crisis or storm: Secondary | ICD-10-CM

## 2019-10-28 DIAGNOSIS — R935 Abnormal findings on diagnostic imaging of other abdominal regions, including retroperitoneum: Secondary | ICD-10-CM | POA: Diagnosis not present

## 2019-10-28 DIAGNOSIS — I471 Supraventricular tachycardia, unspecified: Secondary | ICD-10-CM

## 2019-10-28 DIAGNOSIS — E876 Hypokalemia: Secondary | ICD-10-CM | POA: Diagnosis not present

## 2019-10-28 DIAGNOSIS — R600 Localized edema: Secondary | ICD-10-CM

## 2019-10-28 DIAGNOSIS — I10 Essential (primary) hypertension: Secondary | ICD-10-CM

## 2019-10-28 MED ORDER — POLYETHYLENE GLYCOL 3350 17 GM/SCOOP PO POWD: 1.0000 | Freq: Every day | ORAL | 0 refills | Status: AC

## 2019-10-28 MED ORDER — OMEPRAZOLE 20 MG PO CPDR: 20.0000 mg | DELAYED_RELEASE_CAPSULE | Freq: Every day | ORAL | 0 refills | Status: DC

## 2019-10-28 MED ORDER — TRIAMTERENE-HCTZ 37.5-25 MG PO CAPS: 1.0000 | ORAL_CAPSULE | Freq: Every day | ORAL | 3 refills | Status: DC

## 2019-10-28 NOTE — Patient Instructions (Addendum)
U/s is scheduled on 11/04/2019 [facility] 8:[facility] mall for a 9:00 scan. Nothing to eat or drank after midnight

## 2019-10-28 NOTE — Patient Instructions (Signed)
Medication Instructions:  1- STOP Losartan 2- START triamterene-hydrochlorothiazide (DYAZIDE) Take 1 each (1 capsule total) by mouth daily. *If you need a refill on your cardiac medications before your next appointment, please call your pharmacy*   Lab Work: 1- Your physician recommends that you have lab work today(BMET) 2- Your physician recommends that you return for lab work in: 1 week at the medical mall. (BMET) No appt is needed. Hours are M-F 7AM- 6 PM.  If you have labs (blood work) drawn today and your tests are completely normal, you will receive your results only by: Marland Kitchen MyChart Message (if you have MyChart) OR . A paper copy in the mail If you have any lab test that is abnormal or we need to change your treatment, we will call you to review the results.   Testing/Procedures: None ordered    Follow-Up: At [facility], you and your health needs are our priority.  As part of our continuing mission to provide you with exceptional heart care, we have created designated Provider Care Teams.  These Care Teams include your primary Cardiologist (physician) and Advanced Practice Providers (APPs -  Physician Assistants and Nurse Practitioners) who all work together to provide you with the care you need, when you need it.  We recommend signing up for the patient portal called "MyChart".  Sign up information is provided on this After Visit Summary.  MyChart is used to connect with patients for Virtual Visits (Telemedicine).  Patients are able to view lab/test results, encounter notes, upcoming appointments, etc.  Non-urgent messages can be sent to your provider as well.   To learn more about what you can do with MyChart, go to [email or web address].    Your next appointment:   1 month(s)  The format for your next appointment:   In Person  Provider:    You may see Kathlyn Sacramento, MD or one of the following Advanced Practice Providers on your designated Care Team:    Murray Hodgkins, NP  Marrianne Mood, PA-C

## 2019-10-28 NOTE — Progress Notes (Signed)
Bianca Acosta [address]  [place]  [place], [place] [address]  Main: [phone]  Fax: [phone]   Gastroenterology Consultation  Referring Provider:     Wellington Hampshire, MD Primary Care Physician:  Marval Regal, NP Reason for Consultation:     Reflux, altered bowel habits        HPI:   Virtual Visit via Video Note  I connected with patient on 10/28/19 at  1:15 PM EDT by video ([email or web address]) and verified that I am speaking with the correct person using two identifiers.   I discussed the limitations, risks, security and privacy concerns of performing an evaluation and management service by video and the availability of in person appointments. I also discussed with the patient that there may be a patient responsible charge related to this service. The patient expressed understanding and agreed to proceed.  Location of the patient: Home Location of provider: Home Participating persons: Patient and provider only (Nursing staff checked in patient via phone but were not physically involved in the video interaction - see their notes)   History of Present Illness: Chief Complaint  Patient presents with  . New Patient (Initial Visit)  . hypokalemia    Has had diarrhea that comes and goes. had some abdominal pain   . Constipation    Patient goes from constipation and diarrhea   . Gastroesophageal Reflux    Patient states she has burning in throat and acid reflex.     Bianca Acosta is a 44 y.o. y/o female referred for consultation & management  by Dr. Jerelene Redden, Janalyn Harder, NP.  Patient reports 1 to 22-month history of burning sensation in chest and epigastric pain.  No weight loss.  No blood in stool.  No prior EGD or colonoscopy.  Had a recent CT scan that incidentally showed "possible tiny gallbladder stone.  Indeterminate soft tissue in the superior spleen."  She was advised to have this evaluated by GI.  Her epigastric pain is not entirely related to meals.  She  does not have any vomiting.  No fever or chills.  No family history of colon cancer.  Also reports constipation abdominal bloating  Past Medical History:  Diagnosis Date  . Brain aneurysm 06/29/2015   a. s/p craniotomy.  . Chest pain    a. 09/2019 Cardiac CT: Ca2+ score = Zero.  . Chicken pox   . Headache   . History of echocardiogram    a. 12/2018 Echo: EF 60-65%. DD. Mild LAE.  Marland Kitchen Hypertension   . PSVT (paroxysmal supraventricular tachycardia) ([place])    a. 08/2019 Zio: Avg HR 84 (NSR). 4 beats WCT (likely SVT w/ aberrancy). 4 SVT episodes, longes 9.9 secs. Rare PACs/PVCs (<1%). Some triggered events = SVT and/or PVCs.  . Sleep apnea 10/2018  . Thyroid nodule greater than or equal to 1.5 cm in diameter incidentally noted on imaging study    a. 09/2019 CTA Chest: Ill-defined 2.5cm L thyroid nodule - rec f/u U/S.    Past Surgical History:  Procedure Laterality Date  . CRANIOTOMY Right 06/29/2015   Procedure: CRANIOTOMY INTRACRANIAL ANEURYSM CLIPPING;  Surgeon: Kevan Ny Ditty, MD;  Location: [facility];  Service: Neurosurgery;  Laterality: Right;  . IR GENERIC HISTORICAL  05/25/2016   IR ANGIO INTRA EXTRACRAN SEL INTERNAL CAROTID BILAT MOD SED 05/25/2016 Consuella Lose, MD [facility]  . IR GENERIC HISTORICAL  06/29/2015   IR ANGIO INTRA EXTRACRAN SEL INTERNAL CAROTID BILAT MOD SED 06/29/2015 Nena Polio  Kathyrn Sheriff, MD [facility]  . IR GENERIC HISTORICAL  06/29/2015   IR ANGIO VERTEBRAL SEL VERTEBRAL UNI L MOD SED 06/29/2015 Consuella Lose, MD [facility]  . IR GENERIC HISTORICAL  06/29/2015   IR 3D INDEPENDENT WKST 06/29/2015 Consuella Lose, MD [facility]  . UTERINE FIBROID SURGERY  2020    Prior to Admission medications   Medication Sig Start Date End Date Taking? Authorizing Provider  albuterol (PROVENTIL HFA;VENTOLIN HFA) 108 (90 Base) MCG/ACT inhaler Inhale 2 puffs into the lungs every 6 (six) hours as needed for wheezing or shortness of breath. 08/29/18  Yes  Guse, Jacquelynn Cree, FNP  carvedilol (COREG) 12.5 MG tablet Take 1 tablet (12.5 mg total) by mouth 2 (two) times daily. 10/12/19  Yes Wellington Hampshire, MD  methimazole (TAPAZOLE) 10 MG tablet Take 1 tablet (10 mg total) by mouth daily. 10/09/19  Yes Carrie Mew, MD  potassium chloride (KLOR-CON) 10 MEQ tablet Take 1 tablet (10 mEq total) by mouth 2 (two) times daily for 5 days. 10/16/19 10/27/19 Yes Marrianne Mood D, PA-C  Probiotic Product (FORTIFY PROBIOTIC WOMENS EX ST) CPDR Take 1 capsule by mouth daily. 09/17/18  Yes Guse, Jacquelynn Cree, FNP  spironolactone (ALDACTONE) 25 MG tablet Take 1 tablet (25 mg total) by mouth daily. 10/01/19 12/30/19 Yes Visser, Jacquelyn D, PA-C  cholecalciferol (VITAMIN D3) 25 MCG (1000 UT) tablet Take 1 tablet (1,000 Units total) by mouth daily. Patient not taking: Reported on 10/27/2019 09/26/18   Jodelle Green, FNP  triamterene-hydrochlorothiazide (DYAZIDE) 37.5-25 MG capsule Take 1 each (1 capsule total) by mouth daily. 10/28/19   Theora Gianotti, NP    Family History  Problem Relation Age of Onset  . Hypertension Mother   . Hypertension Father   . Hypertension Maternal Aunt   . Hypertension Maternal Uncle   . Hypertension Paternal Aunt   . Hypertension Paternal Uncle   . Hypertension Maternal Grandmother   . Hypertension Maternal Grandfather   . Hypertension Paternal Grandmother   . Hypertension Paternal Grandfather      Social History   Tobacco Use  . Smoking status: Current Every Day Smoker    Packs/day: 0.50    Years: 22.00    Pack years: 11.00  . Smokeless tobacco: Never Used  . Tobacco comment: 1 pack lasts a day or two  Substance Use Topics  . Alcohol use: Yes    Comment: ocassionally  . Drug use: No    Allergies as of 10/28/2019 - Review Complete 10/28/2019  Allergen Reaction Noted  . Amoxicillin  11/05/2018  . Augmentin [amoxicillin-pot clavulanate] Hives 06/15/2015  . Ciprofloxacin Itching 01/05/2015  . Clavulanic acid   11/05/2018    Review of Systems:    All systems reviewed and negative except where noted in HPI.   Observations/Objective:  Labs: CBC    Component Value Date/Time   WBC 6.4 10/11/2019 1501   RBC 4.87 10/11/2019 1501   HGB 13.8 10/11/2019 1501   HGB 14.3 10/08/2019 1419   HCT 41.0 10/11/2019 1501   HCT 42.8 10/08/2019 1419   PLT 266 10/11/2019 1501   PLT 253 10/08/2019 1419   MCV 84.2 10/11/2019 1501   MCV 87 10/08/2019 1419   MCV 80 08/25/2013 1837   MCH 28.3 10/11/2019 1501   MCHC 33.7 10/11/2019 1501   RDW 13.3 10/11/2019 1501   RDW 13.2 10/08/2019 1419   RDW 15.6 (H) 08/25/2013 1837   LYMPHSABS 2,362 05/16/2018 0927   LYMPHSABS 3.1 10/31/2012 1122  MONOABS 0.5 05/25/2016 0657   MONOABS 0.6 10/31/2012 1122   EOSABS 250 05/16/2018 0927   EOSABS 0.2 10/31/2012 1122   BASOSABS 58 05/16/2018 0927   BASOSABS 0.2 (H) 10/31/2012 1122   CMP     Component Value Date/Time   NA 140 10/15/2019 1000   NA 141 10/09/2019 1249   NA 142 08/25/2013 1837   K 3.1 (L) 10/15/2019 1000   K 2.9 (L) 08/25/2013 1837   CL 102 10/15/2019 1000   CL 106 08/25/2013 1837   CO2 29 10/15/2019 1000   CO2 30 08/25/2013 1837   GLUCOSE 130 (H) 10/15/2019 1000   GLUCOSE 107 (H) 08/25/2013 1837   BUN 18 10/15/2019 1000   BUN 10 10/09/2019 1249   BUN 10 08/25/2013 1837   CREATININE 0.95 10/15/2019 1000   CREATININE 0.85 05/16/2018 0927   CALCIUM 9.5 10/15/2019 1000   CALCIUM 9.0 08/25/2013 1837   PROT 7.3 10/11/2019 1501   PROT 7.8 10/31/2012 1122   ALBUMIN 3.9 10/11/2019 1501   ALBUMIN 3.8 10/31/2012 1122   AST 20 10/11/2019 1501   AST 23 10/31/2012 1122   ALT 20 10/11/2019 1501   ALT 29 10/31/2012 1122   ALKPHOS 51 10/11/2019 1501   ALKPHOS 81 10/31/2012 1122   BILITOT 0.7 10/11/2019 1501   BILITOT 0.4 10/31/2012 1122   GFRNONAA >60 10/15/2019 1000   GFRNONAA >60 08/25/2013 1837   GFRAA >60 10/15/2019 1000   GFRAA >60 08/25/2013 1837    Imaging Studies: DG Chest 1  View  Result Date: 10/11/2019 CLINICAL DATA:  44 year old female with shortness of breath. EXAM: CHEST  1 VIEW COMPARISON:  Chest radiograph dated 07/12/2019. FINDINGS: The heart size and mediastinal contours are within normal limits. Both lungs are clear. The visualized skeletal structures are unremarkable. IMPRESSION: No active disease. Electronically Signed   By: Anner Crete M.D.   On: 10/11/2019 18:07   CT Angio Chest PE W and/or Wo Contrast  Result Date: 10/11/2019 CLINICAL DATA:  44 year old female with chest pressure and epigastric pain and shortness of breath. EXAM: CT ANGIOGRAPHY CHEST WITH CONTRAST TECHNIQUE: Multidetector CT imaging of the chest was performed using the standard protocol during bolus administration of intravenous contrast. Multiplanar CT image reconstructions and MIPs were obtained to evaluate the vascular anatomy. CONTRAST:  27mL OMNIPAQUE IOHEXOL 350 MG/ML SOLN COMPARISON:  Chest radiograph dated 10/11/2019. FINDINGS: Cardiovascular: Mild cardiomegaly. No pericardial effusion. The thoracic aorta is unremarkable. The origins of the great vessels of the aortic arch appear patent as visualized. Evaluation of the pulmonary arteries is limited due to suboptimal opacification and timing of the contrast. No pulmonary artery embolus identified. Mediastinum/Nodes: There is no hilar or mediastinal adenopathy. The esophagus is grossly unremarkable. There is an ill-defined 2.5 cm left thyroid nodule. Further evaluation with ultrasound on a nonemergent basis recommended. No mediastinal fluid collection. Lungs/Pleura: No focal consolidation, pleural effusion, or pneumothorax. The central airways are patent. Upper Abdomen: Possible tiny gallbladder stone. Indeterminate 2.7 x 2.5 cm soft tissue in the superior spleen. Further evaluation with ultrasound on a nonemergent/outpatient basis recommended. Musculoskeletal: No chest wall abnormality. No acute or significant osseous findings. Review of  the MIP images confirms the above findings. IMPRESSION: 1. No acute intrathoracic pathology. No CT evidence of pulmonary embolism. 2. Ill-defined 2.5 cm left thyroid nodule. Further evaluation with ultrasound on a nonemergent basis recommended. 3. Indeterminate soft tissue in the superior spleen. Further evaluation with ultrasound on a nonemergent/outpatient basis recommended. Electronically Signed   By: Milas Hock  Radparvar M.D.   On: 10/11/2019 22:13   US PELVIS TRANSVAGINAL NON-OB (TV ONLY)  Result Date: 10/26/2019 Patient Name: YASHIKA MAGALHAES DOB: January 20, 1976 MRN: [id number] ULTRASOUND REPORT Location: [facility] Date of Service: 10/26/2019 Indications: Fibroids Findings: The uterus is anteverted and measures 12.3 x 8.8 x 7.9 cm. Echo texture is heterogenous with evidence of focal masses. Within the uterus are multiple suspected fibroids measuring: Fibroid 1:  21.4 x 15.6 x 15.4 mm greater than 50 % submucosal Fibroid 2: 17.2 x 17.0 x 16.9 mm less than 50 % submucosal Fibroid 3: 19.1 x 11.9 x 17.7 mm up to  50% submucosal Fibroid 4: 10.7 x 9.7 x 11.1 mm less than 50% submucosal Fibroid 5: 37.6 x 33.3 x 37.9 mm intramural fundal, calcified Fibroid 6: 50.2 x 29.1 x 42.5 mm intramural right Fibroid 7: 31.2 x 26.3 x 24.0 mm subserosal left Fibroid 8: 25.4 x 22.6 x 28.9 mm. Left adnexa/possible broad ligament The Endometrium measures 11.1 mm. Right Ovary measures 4.8 x 1.9 x 1.8 cm. It is normal in appearance. Left Ovary measures 3.4 x 2.5 x 1.6 cm. It is normal in appearance. Survey of the adnexa demonstrates no adnexal masses. There is no free fluid in the cul de sac. Impression: 1. There are multiple uterine fibroids, to numerous to count. The largest ones and the ones touching the endometrium were measured. 2. Normal  appearing ovaries. Recommendations: 1.Clinical correlation with the patient's History and Physical Exam. Gweneth Dimitri, RT Multiple uterine fibroids are imaged on today's study.  The  larges of which measures 50.2 x 29.1 x 42.55mm.  Some of these fibroids to appear to have submucosal components.  The adnexa image normally.  The endometrial strip is normal in appearance and homogenous Malachy Mood, MD, [facility], [facility]   CT CARDIAC SCORING  Addendum Date: 09/28/2019   ADDENDUM REPORT: 09/28/2019 19:11 CLINICAL DATA:  Risk stratification EXAM: Coronary Calcium Score TECHNIQUE: The patient was scanned on a [employer] scanner. Axial non-contrast 3 mm slices were carried out through the heart. The data set was analyzed on a dedicated work station and scored using the [place]. FINDINGS: Non-cardiac: See separate report from [facility]. Ascending Aorta: Normal caliber. Pericardium: Normal. Coronary arteries: Normal origins. IMPRESSION: Coronary calcium score of 0. Eleonore Chiquito, MD Electronically Signed   By: Eleonore Chiquito   On: 09/28/2019 19:11   Result Date: 09/28/2019 EXAM: OVER-READ INTERPRETATION  CT CHEST The following report is an over-read performed by radiologist Dr. Abigail Miyamoto of [facility], [place] on 09/28/2019. This over-read does not include interpretation of cardiac or coronary anatomy or pathology. The calcium score interpretation by the cardiologist is attached. COMPARISON:  Chest radiograph 10/06/2018.  Chest CT 10/28/2012. FINDINGS: Vascular: Normal aortic caliber. Mediastinum/Nodes: No imaged thoracic adenopathy. Lungs/Pleura: No pleural fluid.  Clear imaged lungs. Upper Abdomen: Normal imaged portions of the liver, spleen, stomach. Musculoskeletal: Clear imaged lungs.  Lower thoracic spondylosis. IMPRESSION: No acute findings in the imaged extracardiac chest. Electronically Signed: By: Abigail Miyamoto M.D. On: 09/28/2019 14:21    Assessment and Plan:   SKYLINN BATH is a 44 y.o. y/o female has been referred for epigastric pain, heartburn  Assessment and Plan: H. pylori test for epigastric pain Start  omeprazole after H. pylori test is done  Patient educated extensively on acid reflux lifestyle modification, including buying a bed wedge, not eating 3 hrs before bedtime, diet modifications, and handout given for the same.  MiraLAX for regulating bowel movements as patient reports only had reports only having small bowel movements when she does have them when she does have them and having constipation prior to those.  Assess treatment response on follow-up visit to determine if procedures are indicated based on the same  CT recommended follow-up ultrasound for unknown etiology of spleen soft tissue.  Obtain ultrasound for evaluating gallbladder and spleen  Follow Up Instructions:   I discussed the assessment and treatment plan with the patient. The patient was provided an opportunity to ask questions and all were answered. The patient agreed with the plan and demonstrated an understanding of the instructions.   The patient was advised to call back or seek an in-person evaluation if the symptoms worsen or if the condition fails to improve as anticipated.  I provided 18 minutes of face-to-face time via video software during this encounter.  Additional time was spent in reviewing patient's chart, placing orders etc.   Virgel Manifold, MD  Speech recognition software was used to dictate the above note.

## 2019-10-28 NOTE — Progress Notes (Signed)
Office Visit    Patient Name: Bianca Acosta Date of Encounter: 10/28/2019  Primary Care Provider:  Marval Regal, NP Primary Cardiologist:  Kathlyn Sacramento, MD  Chief Complaint    44 year old female with a history of hypertension, tobacco abuse, obesity, sleep apnea, PSVT, chest pain with coronary calcium score of 0, thyroid nodule, and brain aneurysm status post craniotomy in 2016, who presents for follow-up lower ext edema and wt gain.  Past Medical History    Past Medical History:  Diagnosis Date  . Brain aneurysm 06/29/2015   a. s/p craniotomy.  . Chest pain    a. 09/2019 Cardiac CT: Ca2+ score = Zero.  . Chicken pox   . Headache   . History of echocardiogram    a. 12/2018 Echo: EF 60-65%. DD. Mild LAE.  Marland Kitchen Hypertension   . PSVT (paroxysmal supraventricular tachycardia) ([place])    a. 08/2019 Zio: Avg HR 84 (NSR). 4 beats WCT (likely SVT w/ aberrancy). 4 SVT episodes, longes 9.9 secs. Rare PACs/PVCs (<1%). Some triggered events = SVT and/or PVCs.  . Sleep apnea 10/2018  . Thyroid nodule greater than or equal to 1.5 cm in diameter incidentally noted on imaging study    a. 09/2019 CTA Chest: Ill-defined 2.5cm L thyroid nodule - rec f/u U/S.   Past Surgical History:  Procedure Laterality Date  . CRANIOTOMY Right 06/29/2015   Procedure: CRANIOTOMY INTRACRANIAL ANEURYSM CLIPPING;  Surgeon: Kevan Ny Ditty, MD;  Location: [facility];  Service: Neurosurgery;  Laterality: Right;  . IR GENERIC HISTORICAL  05/25/2016   IR ANGIO INTRA EXTRACRAN SEL INTERNAL CAROTID BILAT MOD SED 05/25/2016 Consuella Lose, MD [facility]  . IR GENERIC HISTORICAL  06/29/2015   IR ANGIO INTRA EXTRACRAN SEL INTERNAL CAROTID BILAT MOD SED 06/29/2015 Consuella Lose, MD [facility]  . IR GENERIC HISTORICAL  06/29/2015   IR ANGIO VERTEBRAL SEL VERTEBRAL UNI L MOD SED 06/29/2015 Consuella Lose, MD [facility]  . IR GENERIC HISTORICAL  06/29/2015   IR 3D INDEPENDENT WKST 06/29/2015  Consuella Lose, MD [facility]  . UTERINE FIBROID SURGERY  2020   Allergies  Allergies  Allergen Reactions  . Amoxicillin   . Augmentin [Amoxicillin-Pot Clavulanate] Hives  . Ciprofloxacin Itching    Patient states she starts itching from inside or internal.  . Clavulanic Acid     History of Present Illness    44 year old female with the above complex past medical history including hypertension, tobacco abuse, obesity, sleep apnea, PSVT, chest pain, thyroid nodule, and brain aneurysm status post craniotomy in 2016.  She has had difficult to control hypertension dating back to her 59s.  Echo in May 2020 showed normal LV function with diastolic dysfunction mild left atrial enlargement.  January 2021, she had an episode of chest pain and also more frequent palpitations.  Cardiac CT for calcium scoring was performed with a calcium score of 0.  Event monitoring showed 4 beats of wide-complex tachycardia felt to be SVT with aberrancy along with 4 episodes of SVT, the longest lasting 9.9 seconds.  Some triggered events were associated with SVT and/or PVCs.  She was seen back in clinic on February 26, at which time consideration was given to EP referral due to ongoing symptomatic palpitations/brief runs of SVT.  She also complained of constipation and abdominal bloating and due to persistence of those symptoms, she later presented to the emergency department.  There, she was found to be hypokalemic requiring potassium supplementation.  She was also  found to be hyperthyroid with a TSH of 0.27 and a free T4 of 1.25.  She was given a dose of methimazole and atenolol and advised to follow-up with endocrinology.  She presented back to the ED on February 28 with complaints of shortness of breath and epigastric pain.  D-dimer was mildly elevated.  CTA of the chest was negative for PE but did show a 2.5 cm left thyroid nodule, which was previously known.  She was discharged home.  After contact from our office,  in the setting of ongoing hypokalemia with a potassium ranging from 3-3.1 on repeated labs, chlorthalidone therapy was discontinued.  Losartan 25 mg daily was started.  Further, patient reported that she had been taking atenolol and carvedilol after her initial ER visit.  Atenolol was discontinued.  In the setting of newly diagnosed hyperthyroidism, EP referral for evaluation of SVT was also canceled.  Since coming off of diuretic therapy, she has noticed increased swelling in her hands and ankles along with increasing abdominal girth and 14 pound weight gain in the past 12 days.  Blood pressures have been trending 130s to 150s at home.  She has not had any significant palpitations and denies chest pain, dyspnea, PND, orthopnea, dizziness, syncope, or early satiety.  Home Medications    Prior to Admission medications   Medication Sig Start Date End Date Taking? Authorizing Provider  albuterol (PROVENTIL HFA;VENTOLIN HFA) 108 (90 Base) MCG/ACT inhaler Inhale 2 puffs into the lungs every 6 (six) hours as needed for wheezing or shortness of breath. 08/29/18   Jodelle Green, FNP  carvedilol (COREG) 12.5 MG tablet Take 1 tablet (12.5 mg total) by mouth 2 (two) times daily. 10/12/19   Wellington Hampshire, MD  cholecalciferol (VITAMIN D3) 25 MCG (1000 UT) tablet Take 1 tablet (1,000 Units total) by mouth daily. Patient not taking: Reported on 10/27/2019 09/26/18   Jodelle Green, FNP  losartan (COZAAR) 25 MG tablet Take 1 tablet (25 mg total) by mouth daily. 10/16/19 01/14/20  Marrianne Mood D, PA-C  methimazole (TAPAZOLE) 10 MG tablet Take 1 tablet (10 mg total) by mouth daily. 10/09/19   Carrie Mew, MD  potassium chloride (KLOR-CON) 10 MEQ tablet Take 1 tablet (10 mEq total) by mouth 2 (two) times daily for 5 days. 10/16/19 10/27/19  Marrianne Mood D, PA-C  Probiotic Product (FORTIFY PROBIOTIC WOMENS EX ST) CPDR Take 1 capsule by mouth daily. 09/17/18   Jodelle Green, FNP  spironolactone (ALDACTONE) 25 MG  tablet Take 1 tablet (25 mg total) by mouth daily. 10/01/19 12/30/19  Marrianne Mood D, PA-C    Review of Systems    Increasing swelling in her hands and ankles along with increasing abdominal girth.  Weight is up 14 pounds in the past 12 days.  She denies chest pain, palpitations, dyspnea, PND, orthopnea, dizziness, syncope, or early satiety.  All other systems reviewed and are otherwise negative except as noted above.  Physical Exam    VS:  BP (!) 150/98 (BP Location: Left Arm, Patient Position: Sitting, Cuff Size: Normal)   Pulse 71   Ht 5\' 3"  (1.6 m)   Wt 256 lb 6 oz (116.3 kg)   LMP 10/14/2019 (Exact Date)   SpO2 99%   BMI 45.41 kg/m  , BMI Body mass index is 45.41 kg/m. GEN: Obese, in no acute distress. HEENT: normal.  Neck: Supple, obese, difficult to gauge JVP.  No carotid bruits, or masses. Cardiac: RRR, no murmurs, rubs, or gallops. No clubbing,  cyanosis, 1+ bilateral lower extremity edema to the mid calves.  Radials/PT 2+ and equal bilaterally.  Respiratory:  Respirations regular and unlabored, clear to auscultation bilaterally. GI: Soft, nontender, nondistended, BS + x 4. MS: no deformity or atrophy. Skin: warm and dry, no rash. Neuro:  Strength and sensation are intact. Psych: Normal affect.  Accessory Clinical Findings    ECG personally reviewed by me today -regular sinus rhythm, 71, inferolateral T wave inversion- no acute changes.  Lab Results  Component Value Date   WBC 6.4 10/11/2019   HGB 13.8 10/11/2019   HCT 41.0 10/11/2019   MCV 84.2 10/11/2019   PLT 266 10/11/2019   Lab Results  Component Value Date   CREATININE 0.95 10/15/2019   BUN 18 10/15/2019   NA 140 10/15/2019   K 3.1 (L) 10/15/2019   CL 102 10/15/2019   CO2 29 10/15/2019   Lab Results  Component Value Date   ALT 20 10/11/2019   AST 20 10/11/2019   ALKPHOS 51 10/11/2019   BILITOT 0.7 10/11/2019   Lab Results  Component Value Date   CHOL 134 05/16/2018   HDL 49 (L) 05/16/2018     LDLCALC 69 05/16/2018   TRIG 81 05/16/2018   CHOLHDL 2.7 05/16/2018    Lab Results  Component Value Date   HGBA1C 6.0 (H) 10/09/2019    Assessment & Plan    1.  Lower extremity edema/weight gain: Patient's weight is up 14 pounds in the past 12 days in the setting of discontinuation of diuretic therapy.  Looking back at her medication history, she has been on some form of a diuretic for several years, initially triamterene-HCTZ and subsequently chlorthalidone.  Chlorthalidone was discontinued on March 4 secondary to ongoing hypokalemia with a potassium of 3.1 despite oral supplementation.  It is since then that she has noticed weight gain and swelling, and she is mildly edematous today.  We discussed options for management and given hypokalemia on chlorthalidone, and previous good tolerance of triamterene-HCTZ, I will resume triamterene-HCTZ 37.5/25 mg daily.  I will follow-up a basic metabolic panel today and plan to repeat in 1 week.  We will have to watch for hyperkalemia in the setting of concomitant spironolactone usage.  She would like to hold her losartan for right now as she is resuming her diuretic and I think that is reasonable however, I do note that triamterene HCTZ was not particular effective in managing her blood pressure in the past and therefore, I suspect she will require resumption of losartan or titration of carvedilol in the near future.  2.  Essential hypertension: See #1.  Resuming triamterene-HCTZ.  She will hold her losartan for now but may need to resume in the future.  Otherwise continue current dose of carvedilol and spironolactone.  Following a basic metabolic panel today and in 1 week to evaluate potassium levels.  3.  Hypokalemia: Follow-up basic metabolic panel today.  We will have to watch for hyperkalemia in the setting of resumption of triamterene in combination with spironolactone.  Can hopefully come off of potassium supplementation.  4.  Hyperthyroidism: She  has endocrine follow-up tomorrow.  Currently on methimazole and carvedilol.  Heart rate controlled.  5.  PSVT: Quiescent on carvedilol therapy currently.  Management of hyperthyroidism as above.  6.  History of chest pain: Coronary calcium score of 0 by CT earlier this year.  No recurrent chest pain.  7.  Obstructive sleep apnea: Trying to use CPAP.  8.  Disposition: Follow-up basic  metabolic panel today and in 1 week.  She will continue to follow blood pressures at home.  I did advise that she should contact us if systolics are persistently greater than 130, at which time we would add losartan 25 mg daily back to her regimen.  Otherwise, follow-up in clinic in 1 month.   Murray Hodgkins, NP 10/28/2019, 10:13 AM

## 2019-10-29 ENCOUNTER — Other Ambulatory Visit: Payer: Self-pay

## 2019-10-29 ENCOUNTER — Encounter: Payer: Self-pay | Admitting: Internal Medicine

## 2019-10-29 ENCOUNTER — Telehealth: Payer: Self-pay | Admitting: Internal Medicine

## 2019-10-29 ENCOUNTER — Ambulatory Visit (INDEPENDENT_AMBULATORY_CARE_PROVIDER_SITE_OTHER): Payer: BC Managed Care – PPO | Admitting: Internal Medicine

## 2019-10-29 ENCOUNTER — Telehealth: Payer: Self-pay

## 2019-10-29 VITALS — BP 122/82 | HR 80 | Temp 99.0°F | Ht 63.0 in | Wt 252.2 lb

## 2019-10-29 VITALS — BP 137/74 | HR 88 | Temp 97.1°F | Ht 63.0 in | Wt 251.3 lb

## 2019-10-29 DIAGNOSIS — J4520 Mild intermittent asthma, uncomplicated: Secondary | ICD-10-CM

## 2019-10-29 DIAGNOSIS — E0590 Thyrotoxicosis, unspecified without thyrotoxic crisis or storm: Secondary | ICD-10-CM | POA: Diagnosis not present

## 2019-10-29 DIAGNOSIS — F17210 Nicotine dependence, cigarettes, uncomplicated: Secondary | ICD-10-CM

## 2019-10-29 DIAGNOSIS — E041 Nontoxic single thyroid nodule: Secondary | ICD-10-CM | POA: Diagnosis not present

## 2019-10-29 LAB — BASIC METABOLIC PANEL
BUN/Creatinine Ratio: 13 (ref 9–23)
BUN: 12 mg/dL (ref 6–24)
CO2: 22 mmol/L (ref 20–29)
Calcium: 9.1 mg/dL (ref 8.7–10.2)
Chloride: 106 mmol/L (ref 96–106)
Creatinine, Ser: 0.92 mg/dL (ref 0.57–1.00)
GFR calc Af Amer: 88 mL/min/{1.73_m2} (ref >59)
GFR calc non Af Amer: 77 mL/min/{1.73_m2} (ref >59)
Glucose: 96 mg/dL (ref 65–99)
Potassium: 4.2 mmol/L (ref 3.5–5.2)
Sodium: 138 mmol/L (ref 134–144)

## 2019-10-29 LAB — TSH: TSH: 0.71 u[IU]/mL (ref 0.35–4.50)

## 2019-10-29 LAB — T4, FREE: Free T4: 1.18 ng/dL (ref 0.60–1.60)

## 2019-10-29 MED ORDER — ADVAIR HFA 230-21 MCG/ACT IN AERO: 2.0000 | INHALATION_SPRAY | Freq: Two times a day (BID) | RESPIRATORY_TRACT | 12 refills | Status: DC

## 2019-10-29 NOTE — Progress Notes (Signed)
Name: Bianca Acosta MRN: [id number] DOB: 06-22-1976     CONSULTATION DATE: 2.14.2020 REFERRING MD : Fletcher Anon  CHIEF COMPLAINT: follow up OSA  STUDIES:   08/2018  CXR independently reviewed by Me No acute findings No penumonia No edema No effusions  Sleep Study 09/2018 Mild AHI 10   Chief complaint Follow-up sleep apnea Follow-up tobacco abuse  HISTORY OF PRESENT ILLNESS:  Recent diagnosis of sleep apnea with AHI of 10 Started on CPAP however patient has been noncompliant with CPAP over the last several months  Patient states that she rips off the masks and is not comfortable with it She has tried the mask fitting referral services in [place] and says that this is the best mask for her however she will need to restart using her CPAP machine  Patient with a newly diagnosis of hypothyroidism This has caused her A. fib with RVR with pulmonary edema Patient sees cardiology  Patient actively smoking 1 pack a day for last 25 years   No acute infections at this time   Smoking Assessment and Cessation Counseling   Upon further questioning, Patient smokes 1 PPD  I have advised patient to quit/stop smoking as soon as possible due to high risk for multiple medical problems  Patient  is NOT willing to quit smoking  I have advised patient that we can assist and have options of Nicotine replacement therapy. I also advised patient on behavioral therapy and can provide oral medication therapy in conjunction with the other therapies  Follow up next Office visit  for assessment of smoking cessation  Smoking cessation counseling advised for 4 minutes         PAST MEDICAL HISTORY :   has a past medical history of Brain aneurysm (06/29/2015), Chest pain, Chicken pox, Headache, History of echocardiogram, Hypertension, PSVT (paroxysmal supraventricular tachycardia) ([place]), Sleep apnea (10/2018), and Thyroid nodule greater than or equal to 1.5 cm in diameter  incidentally noted on imaging study.  has a past surgical history that includes Craniotomy (Right, 06/29/2015); ir generic historical (05/25/2016); ir generic historical (06/29/2015); ir generic historical (06/29/2015); ir generic historical (06/29/2015); and Uterine fibroid surgery (2020). Prior to Admission medications   Medication Sig Start Date End Date Taking? Authorizing Provider  albuterol (PROVENTIL HFA;VENTOLIN HFA) 108 (90 Base) MCG/ACT inhaler Inhale 2 puffs into the lungs every 6 (six) hours as needed for wheezing or shortness of breath. 08/29/18  Yes Guse, Jacquelynn Cree, FNP  bisacodyl (DULCOLAX) 5 MG EC tablet Take 1 tablet (5 mg total) by mouth daily as needed for moderate constipation. 09/26/18  Yes Guse, Jacquelynn Cree, FNP  chlorthalidone (HYGROTON) 25 MG tablet Take 1 tablet (25 mg total) by mouth daily. 09/12/18 12/11/18 Yes Wellington Hampshire, MD  cholecalciferol (VITAMIN D3) 25 MCG (1000 UT) tablet Take 1 tablet (1,000 Units total) by mouth daily. 09/26/18  Yes Guse, Jacquelynn Cree, FNP  nebivolol (BYSTOLIC) 10 MG tablet Take 1 tablet (10 mg total) by mouth daily. 09/26/18  Yes Guse, Jacquelynn Cree, FNP  omeprazole (PRILOSEC) 20 MG capsule Take 1 capsule (20 mg total) by mouth daily. 09/17/18  Yes Guse, Jacquelynn Cree, FNP  potassium chloride SA (K-DUR,KLOR-CON) 20 MEQ tablet Take 1 tablet (20 mEq total) by mouth daily. 09/23/18  Yes Wellington Hampshire, MD  Probiotic Product (FORTIFY PROBIOTIC WOMENS EX ST) CPDR Take 1 capsule by mouth daily. 09/17/18  Yes Guse, Jacquelynn Cree, FNP  spironolactone (ALDACTONE) 25 MG tablet Take 1 tablet (25 mg total) by mouth daily.  09/12/18 12/11/18 Yes Wellington Hampshire, MD   Allergies  Allergen Reactions  . Amoxicillin   . Augmentin [Amoxicillin-Pot Clavulanate] Hives  . Ciprofloxacin Itching    Patient states she starts itching from inside or internal.  . Clavulanic Acid     Review of Systems:  Gen:  Denies  fever, sweats, chills weight loss  HEENT: Denies blurred vision, double  vision, ear pain, eye pain, hearing loss, nose bleeds, sore throat Cardiac:  No dizziness, chest pain or heaviness, chest tightness,edema, No JVD Resp:   No cough, -sputum production, +shortness of breath,+wheezing, -hemoptysis,  Gi: Denies swallowing difficulty, stomach pain, nausea or vomiting, diarrhea, constipation, bowel incontinence Gu:  Denies bladder incontinence, burning urine Ext:   Denies Joint pain, stiffness or swelling Skin: Denies  skin rash, easy bruising or bleeding or hives Endoc:  Denies polyuria, polydipsia , polyphagia or weight change Psych:   Denies depression, insomnia or hallucinations  Other:  All other systems negative  BP 137/74 (BP Location: Left Arm, Cuff Size: Large)   Pulse 88   Temp (!) 97.1 F (36.2 C) (Temporal)   Ht 5\' 3"  (1.6 m)   Wt 251 lb 4.8 oz (114 kg)   LMP 10/14/2019 (Exact Date)   SpO2 99%   BMI 44.52 kg/m    Physical Examination:   General Appearance: No distress  Neuro:without focal findings,  speech normal,  HEENT: PERRLA, EOM intact.   Pulmonary: normal breath sounds, No wheezing.  CardiovascularNormal S1,S2.  No m/r/g.   Abdomen: Benign, Soft, non-tender. Renal:  No costovertebral tenderness  GU:  Not performed at this time. Endoc: No evident thyromegaly Skin:   warm, no rashes, no ecchymosis  Extremities: normal, no cyanosis, clubbing. PSYCHIATRIC: Mood, affect within normal limits.   ALL OTHER ROS ARE NEGATIVE    ASSESSMENT / PLAN:   Patient with sleep apnea however is noncompliant with CPAP Patient's main issue is the mask and she has tried several masks I have explained to her the risks and benefits of noncompliance of CPAP therapy Patient is at high risk for arrhythmias Patient is at high risk for progressive heart failure Patient is willing to restart her CPAP therapy   Reactive airways disease with intermittent wheezing Patient has increased wheezing over the last several months due to her smoking She has  been using her albuterol more than needed Patient will need to start Advair HFA to assess for wheezing  Smoking cessation strongly advised   Obesity -recommend significant weight loss -recommend changing diet  Deconditioned state -Recommend increased daily activity and exercise  New onset A. fib with hyper thyroidism Continue therapy   Follow-up cardiology as scheduled  Total time 34 minutes   COVID-19 EDUCATION: The signs and symptoms of COVID-19 were discussed with the patient and how to seek care for testing.  The importance of social distancing was discussed today. Hand Washing Techniques and avoid touching face was advised.  MEDICATION ADJUSTMENTS/LABS AND TESTS ORDERED: Start Advair HFA Continue albuterol as needed Please stop smoking Please restart CPAP therapy Follow-up with endocrinology for hyperthyroidism Follow-up with cardiology   CURRENT MEDICATIONS REVIEWED AT LENGTH WITH PATIENT TODAY  Follow-up 6 months   Evony Rezek Patricia Pesa, M.D.  [facility]  Medical Director [place] Director [facility] Cardio-Pulmonary Department

## 2019-10-29 NOTE — Progress Notes (Signed)
Name: Bianca Acosta  MRN/ DOB: [id number], [birth year]    Age/ Sex: 44 y.o., female    PCP: Marval Regal, NP   Reason for Endocrinology Evaluation: Hyperthyroidism     Date of Initial Endocrinology Evaluation: 10/29/2019     HPI: [name] is a 44 y.o. female with a past medical history of HTN, brain aneurysm, paroxysmal SVT and OSA. The patient presented for initial endocrinology clinic visit on 10/29/2019 for consultative assistance with her hyperthyroidism    Patient has been noted with hyperthyroidism in 09/2019 during evaluation of resistant hypertension.  Her TSH was 0.270 uIU/mL , with an elevated free T4 at 1.25 ng/dL patient was also noted to have an incidental left thyroid nodule on CT scan which was ordered for evaluation of shortness of breath.   Methimazole was started 09/2019  Of note the patient did not have any contrast prior to her thyroid checked, she did have a CT angio 2 days after her last TFT draw.   She was having weight loss at the time of initial diagnosis , as well as occasional loose stools, she has chronic palpitations as well.   But since being on methimazole , she has noted weight gain ~ 16 lbs in 2 weeks .   No biotin use  She feels tight at the neck sometimes, as well as difficulty inhaling. No dysphagia   No history of amiodarone use.  No FH of thyroid disease   Sees an ophthalmologist- no evidence of graves' disease   HISTORY:  Past Medical History:  Past Medical History:  Diagnosis Date  . Brain aneurysm 06/29/2015   a. s/p craniotomy.  . Chest pain    a. 09/2019 Cardiac CT: Ca2+ score = Zero.  . Chicken pox   . Headache   . History of echocardiogram    a. 12/2018 Echo: EF 60-65%. DD. Mild LAE.  Marland Kitchen Hypertension   . PSVT (paroxysmal supraventricular tachycardia) ([place])    a. 08/2019 Zio: Avg HR 84 (NSR). 4 beats WCT (likely SVT w/ aberrancy). 4 SVT episodes, longes 9.9 secs. Rare PACs/PVCs (<1%). Some triggered  events = SVT and/or PVCs.  . Sleep apnea 10/2018  . Thyroid nodule greater than or equal to 1.5 cm in diameter incidentally noted on imaging study    a. 09/2019 CTA Chest: Ill-defined 2.5cm L thyroid nodule - rec f/u U/S.   Past Surgical History:  Past Surgical History:  Procedure Laterality Date  . CRANIOTOMY Right 06/29/2015   Procedure: CRANIOTOMY INTRACRANIAL ANEURYSM CLIPPING;  Surgeon: Kevan Ny Ditty, MD;  Location: [facility];  Service: Neurosurgery;  Laterality: Right;  . IR GENERIC HISTORICAL  05/25/2016   IR ANGIO INTRA EXTRACRAN SEL INTERNAL CAROTID BILAT MOD SED 05/25/2016 Consuella Lose, MD [facility]  . IR GENERIC HISTORICAL  06/29/2015   IR ANGIO INTRA EXTRACRAN SEL INTERNAL CAROTID BILAT MOD SED 06/29/2015 Consuella Lose, MD [facility]  . IR GENERIC HISTORICAL  06/29/2015   IR ANGIO VERTEBRAL SEL VERTEBRAL UNI L MOD SED 06/29/2015 Consuella Lose, MD [facility]  . IR GENERIC HISTORICAL  06/29/2015   IR 3D INDEPENDENT WKST 06/29/2015 Consuella Lose, MD [facility]  . UTERINE FIBROID SURGERY  2020      Social History:  reports that she has been smoking. She has a 11.00 pack-year smoking history. She has never used smokeless tobacco. She reports current alcohol use. She reports that she does not use drugs.  Family History: family history includes  Hypertension in her father, maternal aunt, maternal grandfather, maternal grandmother, maternal uncle, mother, paternal aunt, paternal grandfather, paternal grandmother, and paternal uncle.   HOME MEDICATIONS: Allergies as of 10/29/2019      Reactions   Amoxicillin    Augmentin [amoxicillin-pot Clavulanate] Hives   Ciprofloxacin Itching   Patient states she starts itching from inside or internal.   Clavulanic Acid       Medication List       Accurate as of October 29, 2019  8:10 AM. If you have any questions, ask your nurse or doctor.        STOP taking these medications   omeprazole 20 MG  capsule Commonly known as: PRILOSEC Stopped by: Dorita Sciara, MD     TAKE these medications   albuterol 108 (90 Base) MCG/ACT inhaler Commonly known as: VENTOLIN HFA Inhale 2 puffs into the lungs every 6 (six) hours as needed for wheezing or shortness of breath.   carvedilol 12.5 MG tablet Commonly known as: COREG Take 1 tablet (12.5 mg total) by mouth 2 (two) times daily.   cholecalciferol 25 MCG (1000 UNIT) tablet Commonly known as: VITAMIN D3 Take 1 tablet (1,000 Units total) by mouth daily.   Fortify Probiotic Womens Ex St Cpdr Take 1 capsule by mouth daily.   methimazole 10 MG tablet Commonly known as: TAPAZOLE Take 1 tablet (10 mg total) by mouth daily.   polyethylene glycol powder 17 GM/SCOOP powder Commonly known as: GLYCOLAX/MIRALAX Take 255 g by mouth daily.   potassium chloride 10 MEQ tablet Commonly known as: KLOR-CON Take 1 tablet (10 mEq total) by mouth 2 (two) times daily for 5 days.   spironolactone 25 MG tablet Commonly known as: ALDACTONE Take 1 tablet (25 mg total) by mouth daily.   triamterene-hydrochlorothiazide 37.5-25 MG capsule Commonly known as: DYAZIDE Take 1 each (1 capsule total) by mouth daily.         REVIEW OF SYSTEMS: A comprehensive ROS was conducted with the patient and is negative except as per HPI and below:  ROS     OBJECTIVE:  VS: BP 122/82 (BP Location: Left Arm, Patient Position: Sitting, Cuff Size: Large)   Pulse 80   Temp 99 F (37.2 C)   Ht 5\' 3"  (1.6 m)   Wt 252 lb 3.2 oz (114.4 kg)   LMP 10/14/2019 (Exact Date)   SpO2 98%   BMI 44.68 kg/m    Wt Readings from Last 3 Encounters:  10/29/19 252 lb 3.2 oz (114.4 kg)  10/28/19 256 lb 6 oz (116.3 kg)  10/26/19 256 lb (116.1 kg)     EXAM: General: Pt appears well and is in NAD  Hydration: Well-hydrated with moist mucous membranes and good skin turgor  Eyes: External eye exam normal without stare, lid lag or exophthalmos. Pt with disconjugate eye  movement due to brain aneurysm  Neck: General: Supple without adenopathy. Thyroid: Thyroid size normal.  No goiter or nodules appreciated. No thyroid bruit.  Lungs: Clear with good BS bilat with no rales, rhonchi, or wheezes  Heart: Auscultation: RRR.  Abdomen: Normoactive bowel sounds, soft, nontender, without masses or organomegaly palpable  Extremities:  BL LE: No pretibial edema normal ROM and strength.  Skin: Hair: Texture and amount normal with gender appropriate distribution Skin Inspection: No rashes, acanthosis nigricans/skin tags. No lipohypertrophy Skin Palpation: Skin temperature, texture, and thickness normal to palpation  Neuro: Cranial nerves: II - XII grossly intact  Motor: Normal strength throughout DTRs: 2+ and symmetric in  UE without delay in relaxation phase  Mental Status: Judgment, insight: Intact Orientation: Oriented to time, place, and person Mood and affect: No depression, anxiety, or agitation     DATA REVIEWED:  Results for IOANNA, GOUGE (MRN [id number]) as of 10/30/2019 12:10  Ref. Range 10/29/2019 08:13  TSH Latest Ref Range: 0.35 - 4.50 uIU/mL 0.71  T4,Free(Direct) Latest Ref Range: 0.60 - 1.60 ng/dL 1.18       Results for VASUDHA, MARINACCIO (MRN [id number]) as of 10/29/2019 07:24  Ref. Range 10/09/2019 15:45  TSH Latest Ref Range: 0.350 - 4.500 uIU/mL 0.270 (L)  T4,Free(Direct) Latest Ref Range: 0.61 - 1.12 ng/dL 1.25 (H)   CT angio 10/11/2019  Ill-defined 2.5 cm left thyroid nodule. Further evaluation with ultrasound on a nonemergent basis recommended.   ASSESSMENT/PLAN/RECOMMENDATIONS:    1. Hyperthyroidism:   -  She was initially clinically hyperthyroid but since being on methimazole, her symptoms have improved -  She is tolerating the methimazole well - we discussed D/D of graves' disease vs toxic nodule (s) and less likely thyroiditis.  - We discussed that Graves' Disease is a result of an autoimmune condition involving the  thyroid.    - We discussed with pt the benefits of methimazole in the Tx of hyperthyroidism, as well as the possible side effects/complications of anti-thyroid drug Tx (specifically detailing the rare, but serious side effect of agranulocytosis). She was informed of need for regular thyroid function monitoring while on methimazole to ensure appropriate dosage without over-treatment. As well, we discussed the possible side effects of methimazole including the chance of rash, the small chance of liver irritation/juandice and the <=1 in 300-400 chance of sudden onset agranulocytosis.  We discussed importance of going to ED promptly (and stopping methimazole) if shewere to develop significant fever with severe sore throat of other evidence of acute infection.      We extensively discussed the various treatment options for hyperthyroidism  including ablation therapy with radioactive iodine versus antithyroid drug treatment versus surgical therapy.  We recommended to the patient that we felt, at this time, that thionamide therapy would be most optimal.  We discussed the various possible benefits versus side effects of the various therapies.    Medications : Continue Methimazole 10 mg   2. Thyroid nodule:   -This was an incidental finding on CT angio in February 2021, a 2.5 cm left thyroid nodule was noted, will proceed with thyroid ultrasound. - She has some non-specific breathing symptoms that I am not sure at this time, if it has anything to do with her thyroid nodule.    F/u in 3 months     Signed electronically by: Mack Guise, MD  [facility]  [facility] [address] [place], [place] [address] Phone: [phone] FAX: [phone]   CC: Marval Regal, NP [id number] [place] [address] Phone: [phone] Fax: [phone]   Return to Endocrinology clinic as below: Future Appointments  Date Time Provider  [place]  10/29/2019 10:45 AM Flora Lipps, MD [facility] None  11/04/2019  9:00 AM [facility]  11/12/2019 10:30 AM [facility] [place] 2 [facility]  12/01/2019 11:15 AM Virgel Manifold, MD [facility] None  12/01/2019  1:45 PM Flora Lipps, MD [facility] None  12/01/2019  2:30 PM Marrianne Mood D, PA-C [facility]  02/02/2020 10:10 AM Nannie Starzyk, Melanie Crazier, MD [facility] None

## 2019-10-29 NOTE — Patient Instructions (Signed)
We recommend that you follow these hyperthyroidism instructions at home:  1) Take Methimazole 10 mg once a day- until you hear otherwise from [place]   If you develop severe sore throat with high fevers OR develop unexplained yellowing of your skin, eyes, under your tongue, severe abdominal pain with nausea or vomiting --> then please get evaluated immediately.

## 2019-10-29 NOTE — Telephone Encounter (Signed)
-----   Message from Theora Gianotti, NP sent at 10/29/2019  7:47 AM EDT ----- Renal fxn/lytes wnl.  Ok to stop potassium supplementation.  F/u bmet as planned next week - preferably Mon/Tues.

## 2019-10-29 NOTE — Telephone Encounter (Signed)
Call to patient to make her aware of lab results and medication changes.   No answer, left detailed message.   Advised pt to call for any further questions or concerns.

## 2019-10-29 NOTE — Telephone Encounter (Signed)
Patient thought she had a call from me, but I did not call patient. She did state while on phone that her advair was too expensive, Dr. Mortimer Fries advised to have patient call her insurance company for a drug formulary and let us know which inhalers are covered.   Patient agreed and will call us back when she has the drug formulary

## 2019-10-29 NOTE — Telephone Encounter (Signed)
Long, Velna Hatchet M routed conversation to [employer] Triage 15 minutes ago (4:14 PM)  Nicolena, Antolik [phone]  Randolm Idol 16 minutes ago (4:14 PM)   Pt called to let us know that her insurance would pay for the Rx Fluticasone. Cb#: [phone]   Incoming call   Called and spoke with patient let her know message was sent to Dr. Mortimer Fries once we hear back we will give her a call with recommendations  Patient states Fluticasone was covered by her insurance.  Dr. Mortimer Fries please advise

## 2019-10-29 NOTE — Telephone Encounter (Signed)
Closing this encounter as one is already open on this topic

## 2019-10-29 NOTE — Patient Instructions (Signed)
START ADVAIR HFA  PLEASE STOP SMOKING  ALBUTEROL AS NEEDED  PLEASE RESTART CPAP

## 2019-10-30 ENCOUNTER — Other Ambulatory Visit: Payer: Self-pay | Admitting: Internal Medicine

## 2019-10-30 ENCOUNTER — Telehealth: Payer: Self-pay | Admitting: Internal Medicine

## 2019-10-30 ENCOUNTER — Encounter: Payer: Self-pay | Admitting: Internal Medicine

## 2019-10-30 MED ORDER — METHIMAZOLE 10 MG PO TABS: 10.0000 mg | ORAL_TABLET | Freq: Every day | ORAL | 6 refills | Status: DC

## 2019-10-30 NOTE — Telephone Encounter (Signed)
I called and spoke with the patient and she states that the pharmacy told her that the Advair was $125 and that her insurance would pay for the generic. I advised her that we sent the generic into the pharmacy. She states that she was headed to the pharmacy and if she has anymore problems she will call back.

## 2019-11-02 ENCOUNTER — Other Ambulatory Visit
Admission: RE | Admit: 2019-11-02 | Discharge: 2019-11-02 | Disposition: A | Discharge status: Home / Self Care | Payer: BC Managed Care – PPO | Source: Ambulatory Visit | Attending: Nurse Practitioner | Admitting: Nurse Practitioner

## 2019-11-02 DIAGNOSIS — E876 Hypokalemia: Secondary | ICD-10-CM | POA: Insufficient documentation

## 2019-11-02 LAB — BASIC METABOLIC PANEL
Anion gap: 10 (ref 5–15)
BUN: 16 mg/dL (ref 6–20)
CO2: 26 mmol/L (ref 22–32)
Calcium: 9.3 mg/dL (ref 8.9–10.3)
Chloride: 103 mmol/L (ref 98–111)
Creatinine, Ser: 0.92 mg/dL (ref 0.44–1.00)
GFR calc Af Amer: >60 mL/min (ref >60)
GFR calc non Af Amer: >60 mL/min (ref >60)
Glucose, Bld: 111 mg/dL — ABNORMAL HIGH (ref 70–99)
Potassium: 3.8 mmol/L (ref 3.5–5.1)
Sodium: 139 mmol/L (ref 135–145)

## 2019-11-02 NOTE — Telephone Encounter (Signed)
FLOVENT 220 mcg 2 puffs twice daily Rinse mouth after each use

## 2019-11-02 NOTE — Telephone Encounter (Signed)
Tried calling the pt and she did not answer- LMTCB °

## 2019-11-02 NOTE — Progress Notes (Signed)
I forwarded this to scheduling to be scheduled.

## 2019-11-03 ENCOUNTER — Other Ambulatory Visit: Payer: Self-pay

## 2019-11-03 DIAGNOSIS — I10 Essential (primary) hypertension: Secondary | ICD-10-CM

## 2019-11-03 LAB — TRAB (TSH RECEPTOR BINDING ANTIBODY): TRAB: <1 IU/L (ref <=2.00)

## 2019-11-03 NOTE — Telephone Encounter (Signed)
This is the patient that we discussed.  Please clarify

## 2019-11-03 NOTE — Telephone Encounter (Signed)
LMTCB x2  

## 2019-11-03 NOTE — Progress Notes (Signed)
Sending this back to you.

## 2019-11-04 ENCOUNTER — Ambulatory Visit
Admission: RE | Admit: 2019-11-04 | Discharge: 2019-11-04 | Disposition: A | Discharge status: Home / Self Care | Payer: BC Managed Care – PPO | Source: Ambulatory Visit | Attending: Gastroenterology | Admitting: Gastroenterology

## 2019-11-04 ENCOUNTER — Other Ambulatory Visit: Payer: Self-pay

## 2019-11-04 ENCOUNTER — Ambulatory Visit: Payer: BC Managed Care – PPO | Admitting: Physician Assistant

## 2019-11-04 DIAGNOSIS — R1013 Epigastric pain: Secondary | ICD-10-CM | POA: Diagnosis not present

## 2019-11-04 DIAGNOSIS — R932 Abnormal findings on diagnostic imaging of liver and biliary tract: Secondary | ICD-10-CM | POA: Diagnosis not present

## 2019-11-04 DIAGNOSIS — K824 Cholesterolosis of gallbladder: Secondary | ICD-10-CM | POA: Diagnosis not present

## 2019-11-04 DIAGNOSIS — R935 Abnormal findings on diagnostic imaging of other abdominal regions, including retroperitoneum: Secondary | ICD-10-CM | POA: Diagnosis not present

## 2019-11-04 NOTE — Telephone Encounter (Signed)
LMTCB x 3 

## 2019-11-06 ENCOUNTER — Telehealth: Payer: Self-pay | Admitting: Internal Medicine

## 2019-11-06 LAB — H. PYLORI BREATH TEST: H pylori Breath Test: NEGATIVE

## 2019-11-06 MED ORDER — FLOVENT HFA 220 MCG/ACT IN AERO: 2.0000 | INHALATION_SPRAY | Freq: Two times a day (BID) | RESPIRATORY_TRACT | 5 refills | Status: DC

## 2019-11-06 NOTE — Telephone Encounter (Signed)
Pt calling back regarding alternative for advair that we have been trying to reach her about multiple times- see phone note 10/29/2019- Per Dr Mortimer Fries- try flovent 220 2 puffs bid. I spoke with the pt and notified her of this and she verbalized understanding. New rx for flovent was sent to pharm.

## 2019-11-06 NOTE — Telephone Encounter (Signed)
Attempted to reach the pt again and still no answer, msg left to call back x 4 and will close encounter per protocol.

## 2019-11-09 ENCOUNTER — Ambulatory Visit
Admission: RE | Admit: 2019-11-09 | Discharge: 2019-11-09 | Disposition: A | Discharge status: Home / Self Care | Payer: BC Managed Care – PPO | Source: Ambulatory Visit | Attending: Internal Medicine | Admitting: Internal Medicine

## 2019-11-09 DIAGNOSIS — E041 Nontoxic single thyroid nodule: Secondary | ICD-10-CM | POA: Diagnosis not present

## 2019-11-09 DIAGNOSIS — E0590 Thyrotoxicosis, unspecified without thyrotoxic crisis or storm: Secondary | ICD-10-CM

## 2019-11-09 NOTE — Telephone Encounter (Signed)
Dr. Nicki Reaper and I discussed her case. I will see Bianca Acosta as a new patient. Please put her on my schedule.

## 2019-11-10 NOTE — Telephone Encounter (Signed)
Called and scheduled pt for 4/7 as a phone visit

## 2019-11-10 NOTE — Telephone Encounter (Signed)
Please advise 

## 2019-11-12 ENCOUNTER — Other Ambulatory Visit
Admission: RE | Admit: 2019-11-12 | Discharge: 2019-11-12 | Disposition: A | Discharge status: Home / Self Care | Payer: BC Managed Care – PPO | Source: Ambulatory Visit | Attending: Nurse Practitioner | Admitting: Nurse Practitioner

## 2019-11-12 ENCOUNTER — Ambulatory Visit (INDEPENDENT_AMBULATORY_CARE_PROVIDER_SITE_OTHER): Payer: BC Managed Care – PPO

## 2019-11-12 ENCOUNTER — Other Ambulatory Visit: Payer: Self-pay

## 2019-11-12 DIAGNOSIS — I5043 Acute on chronic combined systolic (congestive) and diastolic (congestive) heart failure: Secondary | ICD-10-CM | POA: Diagnosis not present

## 2019-11-12 DIAGNOSIS — I10 Essential (primary) hypertension: Secondary | ICD-10-CM | POA: Diagnosis not present

## 2019-11-12 LAB — BASIC METABOLIC PANEL
Anion gap: 10 (ref 5–15)
BUN: 16 mg/dL (ref 6–20)
CO2: 27 mmol/L (ref 22–32)
Calcium: 9.3 mg/dL (ref 8.9–10.3)
Chloride: 102 mmol/L (ref 98–111)
Creatinine, Ser: 1.04 mg/dL — ABNORMAL HIGH (ref 0.44–1.00)
GFR calc Af Amer: >60 mL/min (ref >60)
GFR calc non Af Amer: >60 mL/min (ref >60)
Glucose, Bld: 127 mg/dL — ABNORMAL HIGH (ref 70–99)
Potassium: 3.9 mmol/L (ref 3.5–5.1)
Sodium: 139 mmol/L (ref 135–145)

## 2019-11-16 ENCOUNTER — Telehealth: Payer: Self-pay | Admitting: Internal Medicine

## 2019-11-16 ENCOUNTER — Telehealth: Payer: Self-pay

## 2019-11-16 NOTE — Telephone Encounter (Signed)
Attempted to call patient. LMTCB 11/16/2019

## 2019-11-16 NOTE — Telephone Encounter (Signed)
-----   Message from Arvil Chaco, PA-C sent at 11/14/2019  9:05 PM EDT ----- Please let Ms. Metro know that her echo results were consistent with a normal study. She had above normal pump function at 60-65%. The walls of her heart are moving normally. She has normal heart pressures. Her valves are all functioning appropriately. Overall, excellent echo results!

## 2019-11-16 NOTE — Telephone Encounter (Signed)
Discussed thyroid ultrasound results with the pt on 11/16/2019 at 1230 as below     Thyroid ultrasound  Nodule # 1:  Location: Right; Inferior  Maximum size: 4.4 cm; Other 2 dimensions: 3.4 x 2.5 cm  Composition: solid/almost completely solid (2)  Echogenicity: isoechoic (1)  Shape: not taller-than-wide (0)  Margins: smooth (0)  Echogenic foci: none (0)  ACR TI-RADS total points: 3.  ACR TI-RADS risk category: TR3 (3 points).  ACR TI-RADS recommendations:  **Given size (>/= 2.5 cm) and appearance, fine needle aspiration of this mildly suspicious nodule should be considered based on TI-RADS Criteria.    I have recommended proceeding with  Thyroid uptake and scan prior to FNA, which require holding the methimazole for ~ 5-7 days prior to the uptake and scan. I also explained to the pt that uptake and scan is a 2 day process.   Pt would like to think about this. If she declined uptake and scan, will proceed will FNA.     Abby Nena Jordan, MD  [facility]  [facility] [address] [place], [place] [address] Phone: [phone] FAX: [phone]

## 2019-11-17 NOTE — Telephone Encounter (Signed)
Patient returning call  States a call back tomorrow, 11/18/19, would be better since she will be off work

## 2019-11-18 ENCOUNTER — Telehealth: Payer: BC Managed Care – PPO | Admitting: Nurse Practitioner

## 2019-11-18 ENCOUNTER — Encounter: Payer: Self-pay | Admitting: Internal Medicine

## 2019-11-18 ENCOUNTER — Encounter: Payer: Self-pay | Admitting: Nurse Practitioner

## 2019-11-18 ENCOUNTER — Other Ambulatory Visit: Payer: Self-pay

## 2019-11-18 DIAGNOSIS — E041 Nontoxic single thyroid nodule: Secondary | ICD-10-CM

## 2019-11-18 DIAGNOSIS — I10 Essential (primary) hypertension: Secondary | ICD-10-CM

## 2019-11-18 DIAGNOSIS — G4733 Obstructive sleep apnea (adult) (pediatric): Secondary | ICD-10-CM | POA: Diagnosis not present

## 2019-11-18 DIAGNOSIS — E0590 Thyrotoxicosis, unspecified without thyrotoxic crisis or storm: Secondary | ICD-10-CM

## 2019-11-18 DIAGNOSIS — Z6841 Body Mass Index (BMI) 40.0 and over, adult: Secondary | ICD-10-CM

## 2019-11-18 NOTE — Telephone Encounter (Signed)
Call to patient to review echo results.    Pt verbalized understanding and has no further questions at this time.    Advised pt to call for any further questions or concerns.  No further orders.   

## 2019-11-18 NOTE — Patient Instructions (Addendum)
It was nice to see you again today.  Your BP is  improving on current therapy. Continue to check home readings a few times a week and write those down. Bring in the readings in 4 mo. Please get an eye exam if you have not had one in the last year.   Weight: Continue  working on [employer]. Look at this Bianca Acosta for healthy options that are low in salt.   Sleep apnea- Keep trying to use CPAP. If can't get your mask right- contact your company and make an appt to go into the office and have them fit you- many different mask options and surely one will work for you.   Hyperthyroidism with Thyroid nodule- planning on getting a biopsy in the near future. Follow up your thyroid specialist.   DASH Eating Plan DASH stands for "Dietary Approaches to Stop Hypertension." The DASH eating plan is a healthy eating plan that has been shown to reduce high blood pressure (hypertension). It may also reduce your risk for type 2 diabetes, heart disease, and stroke. The DASH eating plan may also help with weight loss. What are tips for following this plan?  General guidelines  Avoid eating more than 2,300 mg (milligrams) of salt (sodium) a day. If you have hypertension, you may need to reduce your sodium intake to 1,500 mg a day.  Limit alcohol intake to no more than 1 drink a day for nonpregnant women and 2 drinks a day for men. One drink equals 12 oz of beer, 5 oz of wine, or 1 oz of hard liquor.  Work with your health care provider to maintain a healthy body weight or to lose weight. Ask what an ideal weight is for you.  Get at least 30 minutes of exercise that causes your heart to beat faster (aerobic exercise) most days of the week. Activities may include walking, swimming, or biking.  Work with your health care provider or diet and nutrition specialist (dietitian) to adjust your eating plan to your individual calorie needs. Reading food labels   Check food labels for the amount of sodium per  serving. Choose foods with less than 5 percent of the Daily Value of sodium. Generally, foods with less than 300 mg of sodium per serving fit into this eating plan.  To find whole grains, look for the word "whole" as the first word in the ingredient list. Shopping  Buy products labeled as "low-sodium" or "no salt added."  Buy fresh foods. Avoid canned foods and premade or frozen meals. Cooking  Avoid adding salt when cooking. Use salt-free seasonings or herbs instead of table salt or sea salt. Check with your health care provider or pharmacist before using salt substitutes.  Do not fry foods. Cook foods using healthy methods such as baking, boiling, grilling, and broiling instead.  Cook with heart-healthy oils, such as olive, canola, soybean, or sunflower oil. Meal planning  Eat a balanced diet that includes: ? 5 or more servings of fruits and vegetables each day. At each meal, try to fill half of your plate with fruits and vegetables. ? Up to 6-8 servings of whole grains each day. ? Less than 6 oz of lean meat, poultry, or fish each day. A 3-oz serving of meat is about the same size as a deck of cards. One egg equals 1 oz. ? 2 servings of low-fat dairy each day. ? A serving of nuts, seeds, or beans 5 times each week. ? Heart-healthy fats. Healthy fats  called Omega-3 fatty acids are found in foods such as flaxseeds and coldwater fish, like sardines, salmon, and mackerel.  Limit how much you eat of the following: ? Canned or prepackaged foods. ? Food that is high in trans fat, such as fried foods. ? Food that is high in saturated fat, such as fatty meat. ? Sweets, desserts, sugary drinks, and other foods with added sugar. ? Full-fat dairy products.  Do not salt foods before eating.  Try to eat at least 2 vegetarian meals each week.  Eat more home-cooked food and less restaurant, buffet, and fast food.  When eating at a restaurant, ask that your food be prepared with less salt or  no salt, if possible. What foods are recommended? The items listed may not be a complete list. Talk with your dietitian about what dietary choices are best for you. Grains Whole-grain or whole-wheat bread. Whole-grain or whole-wheat pasta. Brown rice. Bianca Acosta. Bulgur. Whole-grain and low-sodium cereals. Pita bread. Low-fat, low-sodium crackers. Whole-wheat flour tortillas. Vegetables Fresh or frozen vegetables (raw, steamed, roasted, or grilled). Low-sodium or reduced-sodium tomato and vegetable juice. Low-sodium or reduced-sodium tomato sauce and tomato paste. Low-sodium or reduced-sodium canned vegetables. Fruits All fresh, dried, or frozen fruit. Canned fruit in natural juice (without added sugar). Meat and other protein foods Skinless chicken or [place]. Ground chicken or [place]. Pork with fat trimmed off. Fish and seafood. Egg whites. Dried beans, peas, or lentils. Unsalted nuts, nut butters, and seeds. Unsalted canned beans. Lean cuts of beef with fat trimmed off. Low-sodium, lean deli meat. Dairy Low-fat (1%) or fat-free (skim) milk. Fat-free, low-fat, or reduced-fat cheeses. Nonfat, low-sodium ricotta or cottage cheese. Low-fat or nonfat yogurt. Low-fat, low-sodium cheese. Fats and oils Soft margarine without trans fats. Vegetable oil. Low-fat, reduced-fat, or light mayonnaise and salad dressings (reduced-sodium). Canola, safflower, olive, soybean, and sunflower oils. Avocado. Seasoning and other foods Herbs. Spices. Seasoning mixes without salt. Unsalted popcorn and pretzels. Fat-free sweets. What foods are not recommended? The items listed may not be a complete list. Talk with your dietitian about what dietary choices are best for you. Grains Baked goods made with fat, such as croissants, muffins, or some breads. Dry pasta or rice meal packs. Vegetables Creamed or fried vegetables. Vegetables in a cheese sauce. Regular canned vegetables (not low-sodium or reduced-sodium).  Regular canned tomato sauce and paste (not low-sodium or reduced-sodium). Regular tomato and vegetable juice (not low-sodium or reduced-sodium). Bianca Acosta. Olives. Fruits Canned fruit in a light or heavy syrup. Fried fruit. Fruit in cream or butter sauce. Meat and other protein foods Fatty cuts of meat. Ribs. Fried meat. Berniece Salines. Sausage. Bologna and other processed lunch meats. Salami. Fatback. Hotdogs. Bratwurst. Salted nuts and seeds. Canned beans with added salt. Canned or smoked fish. Whole eggs or egg yolks. Chicken or [place] with skin. Dairy Whole or 2% milk, cream, and half-and-half. Whole or full-fat cream cheese. Whole-fat or sweetened yogurt. Full-fat cheese. Nondairy creamers. Whipped toppings. Processed cheese and cheese spreads. Fats and oils Butter. Stick margarine. Lard. Shortening. Ghee. Bacon fat. Tropical oils, such as coconut, palm kernel, or palm oil. Seasoning and other foods Salted popcorn and pretzels. Onion salt, garlic salt, seasoned salt, table salt, and sea salt. Worcestershire sauce. Tartar sauce. Barbecue sauce. Teriyaki sauce. Soy sauce, including reduced-sodium. Steak sauce. Canned and packaged gravies. Fish sauce. Oyster sauce. Cocktail sauce. Horseradish that you find on the shelf. Ketchup. Mustard. Meat flavorings and tenderizers. Bouillon cubes. Hot sauce and Tabasco sauce. Premade or packaged marinades.  Premade or packaged taco seasonings. Relishes. Regular salad dressings. Where to find more information:  National Heart, Lung, and [place]: https://wilson-eaton.com/  American Heart Association: www.heart.org Summary  The DASH eating plan is a healthy eating plan that has been shown to reduce high blood pressure (hypertension). It may also reduce your risk for type 2 diabetes, heart disease, and stroke.  With the DASH eating plan, you should limit salt (sodium) intake to 2,300 mg a day. If you have hypertension, you may need to reduce your sodium intake to 1,500 mg  a day.  When on the DASH eating plan, aim to eat more fresh fruits and vegetables, whole grains, lean proteins, low-fat dairy, and heart-healthy fats.  Work with your health care provider or diet and nutrition specialist (dietitian) to adjust your eating plan to your individual calorie needs. This information is not intended to replace advice given to you by your health care provider. Make sure you discuss any questions you have with your health care provider. Document Revised: 07/12/2017 Document Reviewed: 07/23/2016 Elsevier Patient Education  2020 [employer].

## 2019-11-18 NOTE — Progress Notes (Signed)
Virtual Visit via Video Note  I connected with@ on 11/18/19 at 10:00 AM EDT by a video enabled telemedicine application and verified that I am speaking with the correct person using two identifiers. This visit type was conducted due to national recommendations for restrictions regarding the COVID-19 Pandemic (e.g. social distancing).  This format is felt to be most appropriate for this patient at this time.   I discussed the limitations of evaluation and management by telemedicine and the availability of in person appointments. The patient expressed understanding and agreed to proceed.  Only the patient and myself were on today's video visit. The patient was at home and I was in my office at the time of today's visit.   History of Present Illness:  This 44 year old patient connects via video follow-up of HTN.   HTN: Presents carvedilol 12.5 mg twice daily, triamterene/HCTZ  37.5- 25 mg once daily, and spironolactone 25 mg once daily.  Blood pressure at home running 112/78.  She has had no chest pain, shortness of breath, DOE, dizziness, lightheadedness. No edema. Wt is down from 200 lbs to 242 lb with water weight loss once she got back on her diuretics. She saw Cardiology last month. Echo in May 2020 showed normal  LV function with diastolic dysfunction with mild left atrial enlargement. Cardiac CT showed a calcium score of zero.    BP Readings from Last 3 Encounters:  10/29/19 137/74  10/29/19 122/82  10/28/19 (!) 150/98   HYPERTHYROIDISM: She has a nodule and is followed by ENDOCRINOLOGY and planing on having a Bx.   Lab Results  Component Value Date   TSH 0.71 10/29/2019    TOBACCO ABUSE: Smoking 1 pack per every 2 days and weaning down. She is wanting to quit.   Obesity: Trying to eat a healthy diet.  She is working on weight reduction so that she may get off CPAP.  Wt Readings from Last 3 Encounters:  10/29/19 251 lb 4.8 oz (114 kg)  10/29/19 252 lb 3.2 oz (114.4 kg)  10/28/19  256 lb 6 oz (116.3 kg)    Observations/Objective:  Gen: Awake, alert, no acute distress Resp: Breathing is even and non-labored Psych: calm/pleasant demeanor Neuro: Alert and Oriented x 3, + facial symmetry, speech is clear.  Assessment and Plan:  She was advised:   HTN: Your BP is  improving on current therapy. Continue to check home readings a few times a week and write those down. Bring in the readings in 4 mo. Please get an eye exam if you have not had one in the last year.   Weight: Continue  working on [employer]. Look at this [place] for healthy options that are low in salt.   Sleep apnea- Keep trying to use CPAP. If can't get your mask right- contact your company and make an appt to go into the office and have them fit you- many different mask options and surely one will work for you.   Hyperthyroidism with Thyroid nodule- planning on getting a biopsy in the near future. Follow up your thyroid specialist.    Follow Up Instructions:  I discussed the assessment and treatment plan with the patient. The patient was provided an opportunity to ask questions and all were answered. The patient agreed with the plan and demonstrated an understanding of the instructions.   The patient was advised to call back or seek an in-person evaluation if the symptoms worsen or if the condition fails to improve as anticipated.  Denice Paradise, NP

## 2019-11-18 NOTE — Assessment & Plan Note (Signed)
On carvedilol 12.5 mg twice daily, triamterene/HCTZ  37.5- 25 mg once daily, and spironolactone 25 mg once daily.  Blood pressure at home running 112/78.  She has had no chest pain, shortness of breath, DOE, dizziness, lightheadedness. No edema.She thinks this is the best regimen for her. Is followed by CARDIOLOGY.

## 2019-11-20 ENCOUNTER — Telehealth: Payer: Self-pay | Admitting: Nurse Practitioner

## 2019-11-20 NOTE — Telephone Encounter (Signed)
Called left message for pt to call back and schedule follow up in 4 months.

## 2019-11-24 ENCOUNTER — Other Ambulatory Visit: Payer: BC Managed Care – PPO

## 2019-11-26 ENCOUNTER — Other Ambulatory Visit (HOSPITAL_COMMUNITY)
Admission: RE | Admit: 2019-11-26 | Discharge: 2019-11-26 | Disposition: A | Discharge status: Home / Self Care | Payer: BC Managed Care – PPO | Source: Ambulatory Visit | Attending: Internal Medicine | Admitting: Internal Medicine

## 2019-11-26 ENCOUNTER — Ambulatory Visit
Admission: RE | Admit: 2019-11-26 | Discharge: 2019-11-26 | Disposition: A | Discharge status: Home / Self Care | Payer: BC Managed Care – PPO | Source: Ambulatory Visit | Attending: Internal Medicine | Admitting: Internal Medicine

## 2019-11-26 DIAGNOSIS — E041 Nontoxic single thyroid nodule: Secondary | ICD-10-CM

## 2019-11-26 DIAGNOSIS — D34 Benign neoplasm of thyroid gland: Secondary | ICD-10-CM | POA: Diagnosis not present

## 2019-11-27 LAB — CYTOLOGY - NON PAP

## 2019-11-30 ENCOUNTER — Encounter: Payer: Self-pay | Admitting: Gastroenterology

## 2019-12-01 ENCOUNTER — Telehealth (INDEPENDENT_AMBULATORY_CARE_PROVIDER_SITE_OTHER): Payer: BC Managed Care – PPO | Admitting: Physician Assistant

## 2019-12-01 ENCOUNTER — Ambulatory Visit: Payer: BC Managed Care – PPO | Admitting: Internal Medicine

## 2019-12-01 ENCOUNTER — Telehealth: Payer: Self-pay | Admitting: Physician Assistant

## 2019-12-01 ENCOUNTER — Encounter: Payer: Self-pay | Admitting: Gastroenterology

## 2019-12-01 ENCOUNTER — Other Ambulatory Visit: Payer: Self-pay

## 2019-12-01 ENCOUNTER — Encounter: Payer: Self-pay | Admitting: Physician Assistant

## 2019-12-01 ENCOUNTER — Telehealth (INDEPENDENT_AMBULATORY_CARE_PROVIDER_SITE_OTHER): Payer: BC Managed Care – PPO | Admitting: Gastroenterology

## 2019-12-01 VITALS — BP 122/80 | HR 72 | Ht 63.0 in | Wt 242.0 lb

## 2019-12-01 DIAGNOSIS — K5900 Constipation, unspecified: Secondary | ICD-10-CM

## 2019-12-01 DIAGNOSIS — I471 Supraventricular tachycardia, unspecified: Secondary | ICD-10-CM

## 2019-12-01 DIAGNOSIS — G4730 Sleep apnea, unspecified: Secondary | ICD-10-CM

## 2019-12-01 DIAGNOSIS — Z720 Tobacco use: Secondary | ICD-10-CM | POA: Diagnosis not present

## 2019-12-01 DIAGNOSIS — Z8639 Personal history of other endocrine, nutritional and metabolic disease: Secondary | ICD-10-CM

## 2019-12-01 DIAGNOSIS — I1A0 Resistant hypertension: Secondary | ICD-10-CM

## 2019-12-01 DIAGNOSIS — R12 Heartburn: Secondary | ICD-10-CM

## 2019-12-01 DIAGNOSIS — R600 Localized edema: Secondary | ICD-10-CM

## 2019-12-01 DIAGNOSIS — E0590 Thyrotoxicosis, unspecified without thyrotoxic crisis or storm: Secondary | ICD-10-CM | POA: Diagnosis not present

## 2019-12-01 DIAGNOSIS — I10 Essential (primary) hypertension: Secondary | ICD-10-CM

## 2019-12-01 DIAGNOSIS — R0789 Other chest pain: Secondary | ICD-10-CM

## 2019-12-01 NOTE — Patient Instructions (Signed)
Low-FODMAP Eating Plan  FODMAPs (fermentable oligosaccharides, disaccharides, monosaccharides, and polyols) are sugars that are hard for some people to digest. A low-FODMAP eating plan may help some people who have bowel (intestinal) diseases to manage their symptoms. This meal plan can be complicated to follow. Work with a diet and nutrition specialist (dietitian) to make a low-FODMAP eating plan that is right for you. A dietitian can make sure that you get enough nutrition from this diet. What are tips for following this plan? Reading food labels  Check labels for hidden FODMAPs such as: ? High-fructose syrup. ? Honey. ? Agave. ? Natural fruit flavors. ? Onion or garlic powder.  Choose low-FODMAP foods that contain 3-4 grams of fiber per serving.  Check food labels for serving sizes. Eat only one serving at a time to make sure FODMAP levels stay low. Meal planning  Follow a low-FODMAP eating plan for up to 6 weeks, or as told by your health care provider or dietitian.  To follow the eating plan: 1. Eliminate high-FODMAP foods from your diet completely. 2. Gradually reintroduce high-FODMAP foods into your diet one at a time. Most people should wait a few days after introducing one high-FODMAP food before they introduce the next high-FODMAP food. Your dietitian can recommend how quickly you may reintroduce foods. 3. Keep a daily record of what you eat and drink, and make note of any symptoms that you have after eating. 4. Review your daily record with a dietitian regularly. Your dietitian can help you identify which foods you can eat and which foods you should avoid. General tips  Drink enough fluid each day to keep your urine pale yellow.  Avoid processed foods. These often have added sugar and may be high in FODMAPs.  Avoid most dairy products, whole grains, and sweeteners.  Work with a dietitian to make sure you get enough fiber in your diet. Recommended  foods Grains  Gluten-free grains, such as rice, oats, buckwheat, quinoa, corn, polenta, and millet. Gluten-free pasta, bread, or cereal. Rice noodles. Corn tortillas. Vegetables  Eggplant, zucchini, cucumber, peppers, green beans, Brussels sprouts, bean sprouts, lettuce, arugula, kale, Swiss chard, spinach, collard greens, bok choy, summer squash, potato, and tomato. Limited amounts of corn, carrot, and sweet potato. Green parts of scallions. Fruits  Bananas, oranges, lemons, limes, blueberries, raspberries, strawberries, grapes, cantaloupe, honeydew melon, kiwi, papaya, passion fruit, and pineapple. Limited amounts of dried cranberries, banana chips, and shredded coconut. Dairy  Lactose-free milk, yogurt, and kefir. Lactose-free cottage cheese and ice cream. Non-dairy milks, such as almond, coconut, hemp, and rice milk. Yogurts made of non-dairy milks. Limited amounts of goat cheese, brie, mozzarella, parmesan, swiss, and other hard cheeses. Meats and other protein foods  Unseasoned beef, pork, poultry, or fish. Eggs. Bacon. Tofu (firm) and tempeh. Limited amounts of nuts and seeds, such as almonds, walnuts, brazil nuts, pecans, peanuts, pumpkin seeds, chia seeds, and sunflower seeds. Fats and oils  Butter-free spreads. Vegetable oils, such as olive, canola, and sunflower oil. Seasoning and other foods  Artificial sweeteners with names that do not end in "ol" such as aspartame, saccharine, and stevia. Maple syrup, white table sugar, raw sugar, brown sugar, and molasses. Fresh basil, coriander, parsley, rosemary, and thyme. Beverages  Water and mineral water. Sugar-sweetened soft drinks. Small amounts of orange juice or cranberry juice. Black and green tea. Most dry wines. Coffee. This may not be a complete list of low-FODMAP foods. Talk with your dietitian for more information. Foods to avoid Grains  Wheat,   including kamut, durum, and semolina. Barley and bulgur. Couscous. Wheat-based  cereals. Wheat noodles, bread, crackers, and pastries. Vegetables  Chicory root, artichoke, asparagus, cabbage, snow peas, sugar snap peas, mushrooms, and cauliflower. Onions, garlic, leeks, and the white part of scallions. Fruits  Fresh, dried, and juiced forms of apple, pear, watermelon, peach, plum, cherries, apricots, blackberries, boysenberries, figs, nectarines, and mango. Avocado. Dairy  Milk, yogurt, ice cream, and soft cheese. Cream and sour cream. Milk-based sauces. Custard. Meats and other protein foods  Fried or fatty meat. Sausage. Cashews and pistachios. Soybeans, baked beans, black beans, chickpeas, kidney beans, fava beans, navy beans, lentils, and split peas. Seasoning and other foods  Any sugar-free gum or candy. Foods that contain artificial sweeteners such as sorbitol, mannitol, isomalt, or xylitol. Foods that contain honey, high-fructose corn syrup, or agave. Bouillon, vegetable stock, beef stock, and chicken stock. Garlic and onion powder. Condiments made with onion, such as hummus, chutney, pickles, relish, salad dressing, and salsa. Tomato paste. Beverages  Chicory-based drinks. Coffee substitutes. Chamomile tea. Fennel tea. Sweet or fortified wines such as port or sherry. Diet soft drinks made with isomalt, mannitol, maltitol, sorbitol, or xylitol. Apple, pear, and mango juice. Juices with high-fructose corn syrup. This may not be a complete list of high-FODMAP foods. Talk with your dietitian to discuss what dietary choices are best for you.  Summary  A low-FODMAP eating plan is a short-term diet that eliminates FODMAPs from your diet to help ease symptoms of certain bowel diseases.  The eating plan usually lasts up to 6 weeks. After that, high-FODMAP foods are restarted gradually, one at a time, so you can find out which may be causing symptoms.  A low-FODMAP eating plan can be complicated. It is best to work with a dietitian who has experience with this type of  plan. This information is not intended to replace advice given to you by your health care provider. Make sure you discuss any questions you have with your health care provider. Document Revised: 07/12/2017 Document Reviewed: 03/26/2017 Elsevier Patient Education  2020 Elsevier Inc.  

## 2019-12-01 NOTE — Patient Instructions (Signed)
Medication Instructions:   1. Your physician recommends that you continue on your current medications as directed. Please refer to the Current Medication list given to you today.  *If you need a refill on your cardiac medications before your next appointment, please call your pharmacy*   Lab Work:  1. Your physician recommends that you return for lab work at the medical mall. You will need to be fasting.  No appt is needed. Hours are M-F 7AM- 6 PM.  If you have labs (blood work) drawn today and your tests are completely normal, you will receive your results only by: Marland Kitchen MyChart Message (if you have MyChart) OR . A paper copy in the mail If you have any lab test that is abnormal or we need to change your treatment, we will call you to review the results.   Testing/Procedures:  1. None Ordered   Follow-Up: At [facility], you and your health needs are our priority.  As part of our continuing mission to provide you with exceptional heart care, we have created designated Provider Care Teams.  These Care Teams include your primary Cardiologist (physician) and Advanced Practice Providers (APPs -  Physician Assistants and Nurse Practitioners) who all work together to provide you with the care you need, when you need it.  We recommend signing up for the patient portal called "MyChart".  Sign up information is provided on this After Visit Summary.  MyChart is used to connect with patients for Virtual Visits (Telemedicine).  Patients are able to view lab/test results, encounter notes, upcoming appointments, etc.  Non-urgent messages can be sent to your provider as well.   To learn more about what you can do with MyChart, go to [email or web address].    Your next appointment:   6 month(s)  The format for your next appointment:   Either In Person or Virtual  Provider:    You may see Kathlyn Sacramento, MD or one of the following Advanced Practice Providers on your designated Care Team:     Murray Hodgkins, NP  Christell Faith, PA-C  Marrianne Mood, PA-C

## 2019-12-01 NOTE — Telephone Encounter (Signed)
  Patient Consent for Virtual Visit         Bianca Acosta has provided verbal consent on 12/01/2019 for a virtual visit (video or telephone).   CONSENT FOR VIRTUAL VISIT FOR:  [place]  By participating in this virtual visit I agree to the following:  I hereby voluntarily request, consent and authorize [facility] and its employed or contracted physicians, physician assistants, nurse practitioners or other licensed health care professionals (the Practitioner), to provide me with telemedicine health care services (the "Services") as deemed necessary by the treating Practitioner. I acknowledge and consent to receive the Services by the Practitioner via telemedicine. I understand that the telemedicine visit will involve communicating with the Practitioner through live audiovisual communication technology and the disclosure of certain medical information by electronic transmission. I acknowledge that I have been given the opportunity to request an in-person assessment or other available alternative prior to the telemedicine visit and am voluntarily participating in the telemedicine visit.  I understand that I have the right to withhold or withdraw my consent to the use of telemedicine in the course of my care at any time, without affecting my right to future care or treatment, and that the Practitioner or I may terminate the telemedicine visit at any time. I understand that I have the right to inspect all information obtained and/or recorded in the course of the telemedicine visit and may receive copies of available information for a reasonable fee.  I understand that some of the potential risks of receiving the Services via telemedicine include:  Marland Kitchen Delay or interruption in medical evaluation due to technological equipment failure or disruption; . Information transmitted may not be sufficient (e.g. poor resolution of images) to allow for appropriate medical decision making by the  Practitioner; and/or  . In rare instances, security protocols could fail, causing a breach of personal health information.  Furthermore, I acknowledge that it is my responsibility to provide information about my medical history, conditions and care that is complete and accurate to the best of my ability. I acknowledge that Practitioner's advice, recommendations, and/or decision may be based on factors not within their control, such as incomplete or inaccurate data provided by me or distortions of diagnostic images or specimens that may result from electronic transmissions. I understand that the practice of medicine is not an exact science and that Practitioner makes no warranties or guarantees regarding treatment outcomes. I acknowledge that a copy of this consent can be made available to me via my patient portal ([facility]), or I can request a printed copy by calling the office of [facility].    I understand that my insurance will be billed for this visit.   I have read or had this consent read to me. . I understand the contents of this consent, which adequately explains the benefits and risks of the Services being provided via telemedicine.  . I have been provided ample opportunity to ask questions regarding this consent and the Services and have had my questions answered to my satisfaction. . I give my informed consent for the services to be provided through the use of telemedicine in my medical care

## 2019-12-01 NOTE — Progress Notes (Signed)
Virtual Visit via Video Note   This visit type was conducted due to national recommendations for restrictions regarding the COVID-19 Pandemic (e.g. social distancing) in an effort to limit this patient's exposure and mitigate transmission in our community.  Due to her co-morbid illnesses, this patient is at least at moderate risk for complications without adequate follow up.  This format is felt to be most appropriate for this patient at this time.  All issues noted in this document were discussed and addressed.  A limited physical exam was performed with this format.  Please refer to the patient's chart for her consent to telehealth for [facility].   Date:  12/01/2019   ID:  Bianca Acosta, DOB 1976-08-06, MRN [id number]  Patient Location: Home Provider Location: Office  PCP:  Marval Regal, NP  Cardiologist:  Kathlyn Sacramento, MD  Electrophysiologist:  None   Evaluation Performed:  Follow-Up Visit  Chief Complaint:  44 y.o. female with history of resistant hypertension, tobacco use, obesity, sleep apnea, SVT, and brain aneurysm, and here today for follow-up of blood pressure and echo.  Chief Complaint  Patient presents with  . Other    1 month follow up post ECHO. Meds reviewed verbally with patient.      History of Present Illness:    Bianca Acosta is a 44 y.o. female with PMH as above. She was seen in January 2020, at which time she was on Bystolic and triamterene hydrochlorothiazide. She does not tolerate amlodipine well, as it made her feel sick on her stomach; therefore, she was switched from hydrochlorothiazide to chlorthalidone with addition of spironolactone 50 mg once daily after labs showed low potassium. Her EKG was noted T wave changes, suggestive of ischemia and prolonged QT interval. Echo showed nl EF. There wasmoderate LVH.  In early January 2021, she reported increased anxiety, CP, and increased awareness of her heart. She wassmoking 6-7  cigarettes per day though noted she was willing to try to quit.She was started on Coreg for her racing HR and elevated BP in clinic.   2 week Zio-XT was  placed and showed 4 beat run of WCT, likely SVT with aberrancy. In addition, it noted 4 brief runs of SVT, the losngest of which was 9.9 seconds. She had rare PACs/PVCs at less than 1% burden. Some of her triggered events corresponded to SVT/PVCs. She was referred to EP; however, in the setting of later discovered thyroid issues, this referral was later discontinued (at a later date).  Cardiac CT was performed 2/2 atypical CP and family history of cardiac dz with risk factors including tobacco use.  CAC score 0.   She was found to have hyperthyroidism and started on medication with endocrinology following and bx of L thyroid nodule thyroid benign.   In the setting of low potassium, Chlorthalidone was discontinued and Losartan 25mg  started. Atenolol was discontinued  She was last seen 10/28/19 by Murray Hodgkins, NP. She reported significant increase in her swelling of her hands and ankles since chlorthalidone was discontinued, along with increased abdominal girth and 14 lbs weight gain in the past 12 days. BP was trending 130s to 150s at home.   She presents today to clinic and notes that she is doing overall well, especially since the change to triamterene / HCTZ 37-25 mg once daily. She has been started on inhalers. She notes random chest discomfort on rare occasions when walking up the stairs. She wonders if this is related to gas versus 2/2  her Carvedilol. No racing HR or palpitations. No recent falls or dizziness. She is intending to start her CPAP soon and is excited she may no longer need the CPAP if she loses weight ( goal weight ~210lbs). She has not yet established a workout routine, as she is fatigued when getting home. She does note that her weight is dropping with her weight 242lbs (goal ~210lbs).  She is eating very healthy with low  salt and lots of raw food and salads, and because of this high amount of fiber, she is working with GI to attempt to control her gas 2/2 fiber intake if possible.   She was told that some of her sx may be 2/2 her thyroid. She had a bx of her thyroid nodule with results showing it was benign. She was told that she may be able to come off of thyroid treatment in the future. She currently wonders if additional work will be done on her thyroid, as they mentioned removal of part of her thyroid at one time. She reports increased hydration and volume intake recently 2/2 recommendation after her renal function bumped on most recent labs; however, she denies volume overload. She requests repeat labs today to monitor her renal function, given at the time of her bump in renal function, she did feel a twinge of her kidneys.   Home BP / HR documented to review: BP 113/76, 113/80 HR 70s  The patient does not have symptoms concerning for COVID-19 infection (fever, chills, cough, or new shortness of breath).   Past Medical History:  Diagnosis Date  . Brain aneurysm 06/29/2015   a. s/p craniotomy.  . Chest pain    a. 09/2019 Cardiac CT: Ca2+ score = Zero.  . Chicken pox   . Headache   . History of echocardiogram    a. 12/2018 Echo: EF 60-65%. DD. Mild LAE.  Marland Kitchen Hypertension   . PSVT (paroxysmal supraventricular tachycardia) ([place])    a. 08/2019 Zio: Avg HR 84 (NSR). 4 beats WCT (likely SVT w/ aberrancy). 4 SVT episodes, longes 9.9 secs. Rare PACs/PVCs (<1%). Some triggered events = SVT and/or PVCs.  . Sleep apnea 10/2018  . Thyroid nodule greater than or equal to 1.5 cm in diameter incidentally noted on imaging study    a. 09/2019 CTA Chest: Ill-defined 2.5cm L thyroid nodule - rec f/u U/S.   Past Surgical History:  Procedure Laterality Date  . CRANIOTOMY Right 06/29/2015   Procedure: CRANIOTOMY INTRACRANIAL ANEURYSM CLIPPING;  Surgeon: Kevan Ny Ditty, MD;  Location: [facility];  Service: Neurosurgery;   Laterality: Right;  . IR GENERIC HISTORICAL  05/25/2016   IR ANGIO INTRA EXTRACRAN SEL INTERNAL CAROTID BILAT MOD SED 05/25/2016 Consuella Lose, MD [facility]  . IR GENERIC HISTORICAL  06/29/2015   IR ANGIO INTRA EXTRACRAN SEL INTERNAL CAROTID BILAT MOD SED 06/29/2015 Consuella Lose, MD [facility]  . IR GENERIC HISTORICAL  06/29/2015   IR ANGIO VERTEBRAL SEL VERTEBRAL UNI L MOD SED 06/29/2015 Consuella Lose, MD [facility]  . IR GENERIC HISTORICAL  06/29/2015   IR 3D INDEPENDENT WKST 06/29/2015 Consuella Lose, MD [facility]  . UTERINE FIBROID SURGERY  2020     Current Meds  Medication Sig  . albuterol (PROVENTIL HFA;VENTOLIN HFA) 108 (90 Base) MCG/ACT inhaler Inhale 2 puffs into the lungs every 6 (six) hours as needed for wheezing or shortness of breath.  . carvedilol (COREG) 12.5 MG tablet Take 1 tablet (12.5 mg total) by mouth 2 (two) times daily.  Marland Kitchen  cholecalciferol (VITAMIN D3) 25 MCG (1000 UT) tablet Take 1 tablet (1,000 Units total) by mouth daily.  . fluticasone (FLOVENT HFA) 220 MCG/ACT inhaler Inhale 2 puffs into the lungs in the morning and at bedtime.  . fluticasone-salmeterol (ADVAIR HFA) 230-21 MCG/ACT inhaler Inhale 2 puffs into the lungs 2 (two) times daily.  . methimazole (TAPAZOLE) 10 MG tablet Take 1 tablet (10 mg total) by mouth daily.  . Probiotic Product (FORTIFY PROBIOTIC WOMENS EX ST) CPDR Take 1 capsule by mouth daily.  Marland Kitchen spironolactone (ALDACTONE) 25 MG tablet Take 1 tablet (25 mg total) by mouth daily.  Marland Kitchen triamterene-hydrochlorothiazide (DYAZIDE) 37.5-25 MG capsule Take 1 each (1 capsule total) by mouth daily.     Allergies:   Amoxicillin, Augmentin [amoxicillin-pot clavulanate], Ciprofloxacin, and Clavulanic acid   Social History   Tobacco Use  . Smoking status: Current Every Day Smoker    Packs/day: 0.50    Years: 20.00    Pack years: 10.00    Start date: 57  . Smokeless tobacco: Never Used  . Tobacco comment: 10/29/19- 4 cigs a  day  Substance Use Topics  . Alcohol use: Yes    Comment: ocassionally  . Drug use: No     Family Hx: The patient's family history includes Hypertension in her father, maternal aunt, maternal grandfather, maternal grandmother, maternal uncle, mother, paternal aunt, paternal grandfather, paternal grandmother, and paternal uncle.  ROS:   Please see the history of present illness.    She denies palpitations, dyspnea, pnd, orthopnea, n, v, dizziness, syncope, edema, weight gain, or early satiety. She reports inconsistent occurrence of chest discomfort with stairs.  All other systems reviewed and are negative.  Objective:    Vital Signs:  BP 122/80   Pulse 72   Ht 5\' 3"  (1.6 m)   Wt 242 lb (109.8 kg)   SpO2 99%   BMI 42.87 kg/m    VITAL SIGNS:  reviewed  Accessory clinical findings reviewed:    EKG:  No ECG reviewed.  Previous vitals reviewed today:    Temp Readings from Last 3 Encounters:  10/29/19 (!) 97.1 F (36.2 C) (Temporal)  10/29/19 99 F (37.2 C)  10/11/19 98.7 F (37.1 C) (Oral)   BP Readings from Last 3 Encounters:  12/01/19 122/80  10/29/19 137/74  10/29/19 122/82   Pulse Readings from Last 3 Encounters:  12/01/19 72  10/29/19 88  10/29/19 80    Wt Readings from Last 3 Encounters:  12/01/19 242 lb (109.8 kg)  10/29/19 251 lb 4.8 oz (114 kg)  10/29/19 252 lb 3.2 oz (114.4 kg)     Labs reviewed today:    [place] present? No  Lab Results  Component Value Date   WBC 6.4 10/11/2019   HGB 13.8 10/11/2019   HCT 41.0 10/11/2019   MCV 84.2 10/11/2019   PLT 266 10/11/2019   Lab Results  Component Value Date   CREATININE 1.04 (H) 11/12/2019   BUN 16 11/12/2019   NA 139 11/12/2019   K 3.9 11/12/2019   CL 102 11/12/2019   CO2 27 11/12/2019   Lab Results  Component Value Date   ALT 20 10/11/2019   AST 20 10/11/2019   ALKPHOS 51 10/11/2019   BILITOT 0.7 10/11/2019   Lab Results  Component Value Date   CHOL 134 05/16/2018   HDL  49 (L) 05/16/2018   LDLCALC 69 05/16/2018   TRIG 81 05/16/2018   CHOLHDL 2.7 05/16/2018    Lab Results  Component Value  Date   HGBA1C 6.0 (H) 10/09/2019   Lab Results  Component Value Date   TSH 0.71 10/29/2019     Prior CV Studies reviewed today:    The following studies were reviewed today:  Echo 12/2018 FINDINGS  Left Ventricle: The left ventricle has normal systolic function, with an  ejection fraction of 60-65%. The cavity size was normal. There is  moderately increased left ventricular wall thickness. Left ventricular  diastolic Doppler parameters are consistent  with pseudonormalization. No evidence of left ventricular regional wall  motion abnormalities..  Right Ventricle: The right ventricle has normal systolic function. The  cavity was normal. There is no increase in right ventricular wall  thickness. Right ventricular systolic pressure could not be assessed.  Left Atrium: Left atrial size was mildly dilated.  Right Atrium: Right atrial size was normal in size. Right atrial pressure  is estimated at 3 mmHg.  Interatrial Septum: The interatrial septum was not well visualized.  Pericardium: There is no evidence of pericardial effusion.  Mitral Valve: The mitral valve is normal in structure. Mitral valve  regurgitation is trivial by color flow Doppler.  Tricuspid Valve: The tricuspid valve is normal in structure. Tricuspid  valve regurgitation was not visualized by color flow Doppler.  Aortic Valve: The aortic valve is tricuspid Aortic valve regurgitation was  not visualized by color flow Doppler. There is no evidence of aortic valve  stenosis.  Pulmonic Valve: The pulmonic valve was grossly normal. Pulmonic valve  regurgitation is not visualized by color flow Doppler. No evidence of  pulmonic stenosis.  Aorta: The aortic root is normal in size and structure.  Pulmonary Artery: The pulmonary artery is not well seen.  Venous: The inferior vena cava measures 1.50  cm, is normal in size with  greater than 50% respiratory variability.   Zio 08/27/19 FINAL: Normal sinus rhythm with an average heart rate of 84 bpm. 4 beat run of wide-complex tachycardia likely SVT with aberrancy 4 short episodes of supraventricular tachycardia. The longest lasted 9.9 seconds. Rare PACs and PVCs with a burden of less than 1%. Some triggered events correlated with SVTs and PVCs.  CT CAC Score 09/28/19 FINDINGS: Non-cardiac: See separate report from [facility]. Ascending Aorta: Normal caliber. Pericardium: Normal. Coronary arteries: Normal origins. IMPRESSION: Coronary calcium score of 0. Electronically Signed By: Eleonore Chiquito On: 09/28/2019 19:11  ASSESSMENT & PLAN:    Lower extremity edema, resolved --No further sx. Weight increase after stopping diuretic / chlorthalidone 2/2 hypokalemia. LEE improved with restart of previously (prior) triamterene-HCTZ.  Continue. Will recheck BMET, per patient preference, and given her report of kidney pain, as well as given she has stopped her K supplementation. We reviewed diet and fluid intake. She is continuing to hold her losartan.   Essential HTN --Continues on triamterene-HCTZ and holding Losartan. Reports BP at home is well controlled. On review of today's BP, her DBP is still elevated with preference to defer increased dose Coreg 2/2 CP thought possibly due to this medication as well as desire to avoid additional medications (restart of Losartan). As previously noted, we may need to revisit these options in the future (increased BB, restarted Losartan), given triamterene - HCTZ was not adequate to control her BP in the past. Will defer for now. Will obtain a BMET as above.   Paroxysmal SVT, SVT with aberrancy / WCT PACs/PVCs --No symptoms reported. Continue Coreg. Thyroid / endocrinology follow-up recommended.   Hyperthyroid --Now receiving tx. S/p bx and benign. Improved sx with  control of thyroid.  Continue medications. Further management per endocrinology.   Hypokalemia, resolved --At last visit / with last labs, instructed to discontinue K supplementation with most recent labs. Will recheck K.   Atypical Chest pain --Continue to follow. Occurs only with exertion, specifically climbing stairs. Previous CAC score 0 by CT earlier this year and echo with EF hyperdynamic. Low suspicion for cardiac etiology. Will continue to monitor. She has reported this CP in the past. Could consider escalation of antianginal therapy in the future.   OSA --She will start using her CPAP. Daily use encouraged.   COVID-19 Education: The signs and symptoms of COVID-19 were discussed with the patient and how to seek care for testing (follow up with PCP or arrange E-visit).  The importance of social distancing was discussed today.  Time:   Today, I have spent 30 minutes with the patient with telehealth technology discussing the above problems.     Medication Adjustments/Labs and Tests Ordered: Current medicines are reviewed at length with the patient today.  Concerns regarding medicines are outlined above.   Medication changes: None Labs ordered: BMET  Studies / Imaging ordered: None Future considerations: ?? Increase Coreg or restart Losartan depending on BP at RTC, antianginal therapy? Disposition: In Person in 6 month(s)  Signed, Magda Bernheim  12/01/2019  [facility]

## 2019-12-01 NOTE — Progress Notes (Signed)
Bianca Antigua, MD [address]  [place], [place] [address]  Main: [phone]  Fax: [phone]   Primary Care Physician: Marval Regal, NP  Virtual Visit via Video Note  I connected with patient on 12/01/19 at 11:15 AM EDT by video (using [email or web address]) and verified that I am speaking with the correct person using two identifiers.   I discussed the limitations, risks, security and privacy concerns of performing an evaluation and management service by video and the availability of in person appointments. I also discussed with the patient that there may be a patient responsible charge related to this service. The patient expressed understanding and agreed to proceed.  Location of Patient: Home Location of Provider: Home Persons involved: Patient and provider only (Nursing staff checked in patient via phone but were not physically involved in the video interaction - see their notes)   History of Present Illness: Chief Complaint  Patient presents with  . abnormal CT    Patient is having no symptoms     HPI: Bianca Acosta is a 44 y.o. female here for follow-up of heartburn, epigastric pain and abnormal CT.   H. pylori testing was negative.  Omeprazole was prescribed and patient states her symptoms self resolved and she has not taken the medication.  She was also having constipation issues and MiraLAX was recommended.  However, patient states since they have been working on her thyroid abnormalities, her symptoms have self resolved and she does not have constipation anymore.  She does experience abdominal bloating when she eats raw vegetables but not when she eats cooked vegetables.  No nausea or vomiting or weight loss.  No blood in stool.  No altered bowel habits.   Had a recent CT scan that incidentally showed "possible tiny gallbladder stone.  Indeterminate soft tissue in the superior spleen."  We obtained a follow-up ultrasound that reported the area in  question in the spleen to be a small accessory spleen.  A 3 mm apparent gallbladder polyp was also reported.   Current Outpatient Medications  Medication Sig Dispense Refill  . albuterol (PROVENTIL HFA;VENTOLIN HFA) 108 (90 Base) MCG/ACT inhaler Inhale 2 puffs into the lungs every 6 (six) hours as needed for wheezing or shortness of breath. 1 Inhaler 0  . carvedilol (COREG) 12.5 MG tablet Take 1 tablet (12.5 mg total) by mouth 2 (two) times daily. 180 tablet 0  . cholecalciferol (VITAMIN D3) 25 MCG (1000 UT) tablet Take 1 tablet (1,000 Units total) by mouth daily. 90 tablet 3  . fluticasone (FLOVENT HFA) 220 MCG/ACT inhaler Inhale 2 puffs into the lungs in the morning and at bedtime. 1 Inhaler 5  . fluticasone-salmeterol (ADVAIR HFA) 230-21 MCG/ACT inhaler Inhale 2 puffs into the lungs 2 (two) times daily. 1 Inhaler 12  . methimazole (TAPAZOLE) 10 MG tablet Take 1 tablet (10 mg total) by mouth daily. 30 tablet 6  . Probiotic Product (FORTIFY PROBIOTIC WOMENS EX ST) CPDR Take 1 capsule by mouth daily. 30 capsule 2  . spironolactone (ALDACTONE) 25 MG tablet Take 1 tablet (25 mg total) by mouth daily. 90 tablet 3  . triamterene-hydrochlorothiazide (DYAZIDE) 37.5-25 MG capsule Take 1 each (1 capsule total) by mouth daily. 90 capsule 3   No current facility-administered medications for this visit.    Allergies as of 12/01/2019 - Review Complete 11/30/2019  Allergen Reaction Noted  . Amoxicillin  11/05/2018  . Augmentin [amoxicillin-pot clavulanate] Hives 06/15/2015  . Ciprofloxacin Itching 01/05/2015  .  Clavulanic acid  11/05/2018    Review of Systems:    All systems reviewed and negative except where noted in HPI.   Observations/Objective:  Labs: CMP     Component Value Date/Time   NA 139 11/12/2019 1131   NA 138 10/28/2019 1047   NA 142 08/25/2013 1837   K 3.9 11/12/2019 1131   K 2.9 (L) 08/25/2013 1837   CL 102 11/12/2019 1131   CL 106 08/25/2013 1837   CO2 27 11/12/2019 1131    CO2 30 08/25/2013 1837   GLUCOSE 127 (H) 11/12/2019 1131   GLUCOSE 107 (H) 08/25/2013 1837   BUN 16 11/12/2019 1131   BUN 12 10/28/2019 1047   BUN 10 08/25/2013 1837   CREATININE 1.04 (H) 11/12/2019 1131   CREATININE 0.85 05/16/2018 0927   CALCIUM 9.3 11/12/2019 1131   CALCIUM 9.0 08/25/2013 1837   PROT 7.3 10/11/2019 1501   PROT 7.8 10/31/2012 1122   ALBUMIN 3.9 10/11/2019 1501   ALBUMIN 3.8 10/31/2012 1122   AST 20 10/11/2019 1501   AST 23 10/31/2012 1122   ALT 20 10/11/2019 1501   ALT 29 10/31/2012 1122   ALKPHOS 51 10/11/2019 1501   ALKPHOS 81 10/31/2012 1122   BILITOT 0.7 10/11/2019 1501   BILITOT 0.4 10/31/2012 1122   GFRNONAA >60 11/12/2019 1131   GFRNONAA >60 08/25/2013 1837   GFRAA >60 11/12/2019 1131   GFRAA >60 08/25/2013 1837   Lab Results  Component Value Date   WBC 6.4 10/11/2019   HGB 13.8 10/11/2019   HCT 41.0 10/11/2019   MCV 84.2 10/11/2019   PLT 266 10/11/2019    Imaging Studies: US Abdomen Complete  Result Date: 11/04/2019 CLINICAL DATA:  Questionable upper abdominal lesions on recent chest CT which included upper abdomen EXAM: ABDOMEN ULTRASOUND COMPLETE COMPARISON:  CT angiogram chest including portions of upper abdomen October 11, 2019. Abdominal ultrasound October 31, 2012 FINDINGS: Gallbladder: Within the gallbladder, there is a 3 mm echogenic focus which does not move or shadow, a likely small polyp. No echogenic foci are seen in the gallbladder which move and shadow as is expected with cholelithiasis. There is no gallbladder wall thickening or pericholecystic fluid. No sonographic Murphy sign noted by sonographer. Common bile duct: Diameter: 3 mm. No intrahepatic, common hepatic, common bile duct dilatation. Liver: No focal lesion identified. Within normal limits in parenchymal echogenicity. Portal vein is patent on color Doppler imaging with normal direction of blood flow towards the liver. IVC: No abnormality visualized. Pancreas: No pancreatic  mass or inflammatory focus. Spleen: Size and appearance within normal limits. There is an ovoid structure immediately adjacent to the spleen which has echogenicity comparable to the splenic measuring 2.3 x 1.4 x 2.2 cm. Right Kidney: Length: 10.2 cm. Echogenicity within normal limits. No mass or hydronephrosis visualized. Left Kidney: Length: 10.3 cm. Echogenicity within normal limits. No mass or hydronephrosis visualized. Abdominal aorta: No aneurysm visualized. Other findings: No evident ascites. IMPRESSION: 1. 3 mm apparent gallbladder polyp. Per consensus guidelines, a polyp of this small size does not warrant additional imaging surveillance. No gallstones, gallbladder wall thickening, or pericholecystic fluid. 2. The area question on recent CT appears by ultrasound to represent a small accessory spleen immediately adjacent to the spleen. Spleen otherwise appears normal. 3.  Study otherwise unremarkable. Electronically Signed   By: Lowella Grip III M.D.   On: 11/04/2019 14:47   [place] FNA BX THYROID 1ST LESION AFIRMA  Result Date: 11/26/2019 INDICATION: Indeterminate thyroid nodule Left inferior thyroid  nodule 4.4 cm EXAM: ULTRASOUND GUIDED FINE NEEDLE ASPIRATION OF INDETERMINATE THYROID NODULE COMPARISON:  US Thyroid 11/09/19 MEDICATIONS: 5 cc 1% lidocaine COMPLICATIONS: None immediate. TECHNIQUE: Informed written consent was obtained from the patient after a discussion of the risks, benefits and alternatives to treatment. Questions regarding the procedure were encouraged and answered. A timeout was performed prior to the initiation of the procedure. Pre-procedural ultrasound scanning demonstrated unchanged size and appearance of the indeterminate nodule within the left thyroid The procedure was planned. The neck was prepped in the usual sterile fashion, and a sterile drape was applied covering the operative field. A timeout was performed prior to the initiation of the procedure. Local anesthesia was  provided with 1% lidocaine. Under direct ultrasound guidance, 5 FNA biopsies were performed of the left inferior thyroid nodule with a 25 gauge needle. 2 of these samples were obtained for [facility] as per ordering MD Multiple ultrasound images were saved for procedural documentation purposes. The samples were prepared and submitted to pathology. Limited post procedural scanning was negative for hematoma or additional complication. Dressings were placed. The patient tolerated the above procedures procedure well without immediate postprocedural complication. FINDINGS: Nodule reference number based on prior diagnostic ultrasound: 1 Maximum size: 4.4 cm Location: Left; Inferior ACR TI-RADS risk category: TR3 (3 points) Reason for biopsy: meets ACR TI-RADS criteria Ultrasound imaging confirms appropriate placement of the needles within the thyroid nodule. IMPRESSION: Technically successful ultrasound guided fine needle aspiration of left inferior thyroid nodule Read by Lavonia Drafts [facility] Electronically Signed   By: Lucrezia Europe M.D.   On: 11/26/2019 16:37   ECHOCARDIOGRAM COMPLETE  Result Date: 11/12/2019    ECHOCARDIOGRAM REPORT   Patient Name:   Bianca Acosta Date of Exam: 11/12/2019 Medical Rec #:  [id number]          Height:       63.0 in Accession #:    [id number]         Weight:       251.3 lb Date of Birth:  09-17-1975         BSA:          2.131 m Patient Age:    19 years           BP:           140/80 mmHg Patient Gender: F                  HR:           60 bpm. Exam Location:  [facility] Procedure: 2D Echo, Cardiac Doppler and Color Doppler Indications:    R00.2 Palpitations,; I47.1 SVT  History:        Patient has prior history of Echocardiogram examinations, most                 recent 01/06/2019. Signs/Symptoms:palpitations and Hypertensive                 Heart Disease; Risk Factors:Sleep Apnea, Current Smoker and                 Hypertension. Patient has had symptoms of supraventricular                  tachycardia. She states she will feel palpitations when she                 walks up stairs, this has happened intermittently in the last  few months.  Sonographer:    Salvadore Dom RVT, RDCS (AE), RDMS Referring Phys: [id number] Arvil Chaco  Sonographer Comments: Patient is morbidly obese and suboptimal subcostal window. Image acquisition challenging due to patient body habitus. IMPRESSIONS  1. Left ventricular ejection fraction, by estimation, is 60 to 65%. The left ventricle has normal function. The left ventricle has no regional wall motion abnormalities. Left ventricular diastolic parameters were normal.  2. Right ventricular systolic function is normal. The right ventricular size is normal. There is normal pulmonary artery systolic pressure.  3. The mitral valve is normal in structure. No evidence of mitral valve regurgitation. No evidence of mitral stenosis.  4. The aortic valve is normal in structure. Aortic valve regurgitation is not visualized. No aortic stenosis is present.  5. The inferior vena cava is normal in size with greater than 50% respiratory variability, suggesting right atrial pressure of 3 mmHg. Comparison(s): EF 60-65%. FINDINGS  Left Ventricle: Left ventricular ejection fraction, by estimation, is 60 to 65%. The left ventricle has normal function. The left ventricle has no regional wall motion abnormalities. The left ventricular internal cavity size was normal in size. There is  no left ventricular hypertrophy. Left ventricular diastolic parameters were normal. Right Ventricle: The right ventricular size is normal. No increase in right ventricular wall thickness. Right ventricular systolic function is normal. There is normal pulmonary artery systolic pressure. The tricuspid regurgitant velocity is 1.35 m/s, and  with an assumed right atrial pressure of 3 mmHg, the estimated right ventricular systolic pressure is 123456 mmHg. Left Atrium: Left atrial size was normal in size.  Right Atrium: Right atrial size was normal in size. Pericardium: There is no evidence of pericardial effusion. Mitral Valve: The mitral valve is normal in structure. Normal mobility of the mitral valve leaflets. No evidence of mitral valve regurgitation. No evidence of mitral valve stenosis. Tricuspid Valve: The tricuspid valve is normal in structure. Tricuspid valve regurgitation is not demonstrated. No evidence of tricuspid stenosis. Aortic Valve: The aortic valve is normal in structure. Aortic valve regurgitation is not visualized. No aortic stenosis is present. Aortic valve mean gradient measures 6.0 mmHg. Aortic valve peak gradient measures 9.9 mmHg. Aortic valve area, by VTI measures 4.22 cm. Pulmonic Valve: The pulmonic valve was normal in structure. Pulmonic valve regurgitation is not visualized. No evidence of pulmonic stenosis. Aorta: The aortic root is normal in size and structure. Venous: The inferior vena cava is normal in size with greater than 50% respiratory variability, suggesting right atrial pressure of 3 mmHg. IAS/Shunts: No atrial level shunt detected by color flow Doppler.  LEFT VENTRICLE PLAX 2D LVIDd:         3.80 cm     Diastology LVIDs:         2.20 cm     LV e' lateral:   8.81 cm/s LV PW:         1.40 cm     LV E/e' lateral: 6.9 LV IVS:        1.10 cm     LV e' medial:    5.22 cm/s LVOT diam:     2.30 cm     LV E/e' medial:  11.7 LV SV:         139 LV SV Index:   65 LVOT Area:     4.15 cm  LV Volumes (MOD) LV vol d, MOD A2C: 90.8 ml LV vol d, MOD A4C: 84.8 ml LV vol s, MOD A2C: 28.3 ml LV vol  s, MOD A4C: 23.2 ml LV SV MOD A2C:     62.5 ml LV SV MOD A4C:     84.8 ml LV SV MOD BP:      60.5 ml RIGHT VENTRICLE RV S prime:     12.00 cm/s TAPSE (M-mode): 3.2 cm LEFT ATRIUM             Index       RIGHT ATRIUM           Index LA diam:        3.90 cm 1.83 cm/m  RA Area:     14.00 cm LA Vol (A2C):   70.3 ml 32.99 ml/m RA Volume:   34.50 ml  16.19 ml/m LA Vol (A4C):   53.5 ml 25.11 ml/m LA  Biplane Vol: 62.2 ml 29.19 ml/m  AORTIC VALVE                    PULMONIC VALVE AV Area (Vmax):    3.76 cm     PV Vmax:       0.68 m/s AV Area (Vmean):   3.87 cm     PV Peak grad:  1.8 mmHg AV Area (VTI):     4.22 cm AV Vmax:           157.00 cm/s AV Vmean:          117.000 cm/s AV VTI:            0.330 m AV Peak Grad:      9.9 mmHg AV Mean Grad:      6.0 mmHg LVOT Vmax:         142.00 cm/s LVOT Vmean:        109.000 cm/s LVOT VTI:          0.335 m LVOT/AV VTI ratio: 1.02  AORTA Ao Root diam: 3.20 cm Ao Asc diam:  3.10 cm MITRAL VALVE               TRICUSPID VALVE MV Area (PHT): 3.08 cm    TR Peak grad:   7.3 mmHg MV Decel Time: 246 msec    TR Vmax:        135.00 cm/s MR Peak grad: 58.7 mmHg MR Vmax:      383.00 cm/s  SHUNTS MV E velocity: 61.00 cm/s  Systemic VTI:  0.34 m MV A velocity: 69.40 cm/s  Systemic Diam: 2.30 cm MV E/A ratio:  0.88 Kate Sable MD Electronically signed by Kate Sable MD Signature Date/Time: 11/12/2019/3:16:26 PM    Final    US THYROID  Addendum Date: 11/18/2019   ADDENDUM REPORT: 11/18/2019 18:40 ADDENDUM: In the body of the report, it should state that this nodule is located in the left inferior thyroid gland, not the right inferior thyroid gland. The impression is correct as stated. There is a 4.4 cm left-sided thyroid nodule that meets criteria for fine-needle aspiration. Electronically Signed   By: Constance Holster M.D.   On: 11/18/2019 18:40   Result Date: 11/18/2019 CLINICAL DATA:  Thyroid nodule seen on CT. EXAM: THYROID ULTRASOUND TECHNIQUE: Ultrasound examination of the thyroid gland and adjacent soft tissues was performed. COMPARISON:  CT dated 10/11/2019 FINDINGS: Parenchymal Echotexture: Mildly heterogenous Isthmus: 0.4 cm Right lobe: 5.9 x 1.6 x 2.1 cm Left lobe: 6.4 x 2.7 x 3.9 cm _________________________________________________________ Estimated total number of nodules >/= 1 cm: 1 Number of spongiform nodules >/=  2 cm not described below (TR1): 0 Number  of  mixed cystic and solid nodules >/= 1.5 cm not described below ([place]): 0 _________________________________________________________ Nodule # 1: Location: Right; Inferior Maximum size: 4.4 cm; Other 2 dimensions: 3.4 x 2.5 cm Composition: solid/almost completely solid (2) Echogenicity: isoechoic (1) Shape: not taller-than-wide (0) Margins: smooth (0) Echogenic foci: none (0) ACR TI-RADS total points: 3. ACR TI-RADS risk category: TR3 (3 points). ACR TI-RADS recommendations: **Given size (>/= 2.5 cm) and appearance, fine needle aspiration of this mildly suspicious nodule should be considered based on TI-RADS criteria. _________________________________________________________ There are no concerning lymph nodes identified on this study. IMPRESSION: 1. Mildly heterogeneous, enlarged thyroid gland as detailed above. 2. Dominant 4.4 cm left-sided thyroid nodule. Fine-needle aspiration is recommended for this thyroid nodule. The above is in keeping with the ACR TI-RADS recommendations - J Am Coll Radiol 2017;14:587-595. Electronically Signed: By: Constance Holster M.D. On: 11/09/2019 19:21    Assessment and Plan:   Bianca Acosta is a 44 y.o. y/o female here for follow-up of heartburn and epigastric pain  Assessment and Plan: Symptoms resolved with lifestyle modifications and patient never had to take PPI.  Continue to hold  Constipation self resolving as well  Continue acid reflux lifestyle modifications  Due to abdominal bloating only after eating raw vegetables, FODMAP diet discussed and handout sent for the same  Finding of small gallbladder polyp on ultrasound discussed.  As per radiologist and guidelines, the small polyps do not typically need further follow-up.  We discussed the options of repeat imaging in a few years vs no further follow-up with the patient in detail.  Based on this extensive discussion patient would like to have repeat imaging in 3 years  Benign accessory spleen finding  also discussed which does not need further follow-up  Follow Up Instructions: Follow-up in 1 year   I discussed the assessment and treatment plan with the patient. The patient was provided an opportunity to ask questions and all were answered. The patient agreed with the plan and demonstrated an understanding of the instructions.   The patient was advised to call back or seek an in-person evaluation if the symptoms worsen or if the condition fails to improve as anticipated.  I provided 15 minutes of face-to-face time via video software during this encounter. Additional time was spent in reviewing patient's chart, placing orders etc.   Virgel Manifold, MD  Speech recognition software was used to dictate this note.

## 2019-12-03 ENCOUNTER — Encounter: Payer: Self-pay | Admitting: Internal Medicine

## 2019-12-08 ENCOUNTER — Ambulatory Visit: Payer: BC Managed Care – PPO | Admitting: Gastroenterology

## 2020-01-01 ENCOUNTER — Ambulatory Visit: Payer: BC Managed Care – PPO | Admitting: Physician Assistant

## 2020-01-05 ENCOUNTER — Telehealth: Payer: Self-pay | Admitting: Nurse Practitioner

## 2020-01-05 ENCOUNTER — Other Ambulatory Visit: Payer: Self-pay

## 2020-01-05 DIAGNOSIS — R059 Cough, unspecified: Secondary | ICD-10-CM

## 2020-01-05 DIAGNOSIS — R062 Wheezing: Secondary | ICD-10-CM

## 2020-01-05 MED ORDER — ALBUTEROL SULFATE HFA 108 (90 BASE) MCG/ACT IN AERS: 2.0000 | INHALATION_SPRAY | Freq: Four times a day (QID) | RESPIRATORY_TRACT | 3 refills | Status: DC | PRN

## 2020-01-05 NOTE — Progress Notes (Signed)
Albuterol inhaler refill sent to pharmacy

## 2020-01-05 NOTE — Telephone Encounter (Signed)
Refill sent to pharmacy.   

## 2020-01-05 NOTE — Telephone Encounter (Signed)
Pt called needed a refill on albuterol (PROVENTIL HFA;VENTOLIN HFA) 108 (90 Base) MCG/ACT inhaler

## 2020-01-27 ENCOUNTER — Other Ambulatory Visit: Payer: Self-pay

## 2020-01-29 ENCOUNTER — Encounter: Payer: Self-pay | Admitting: Nurse Practitioner

## 2020-01-29 ENCOUNTER — Telehealth (INDEPENDENT_AMBULATORY_CARE_PROVIDER_SITE_OTHER): Payer: BC Managed Care – PPO | Admitting: Nurse Practitioner

## 2020-01-29 VITALS — BP 125/83 | HR 90 | Ht 62.99 in | Wt 240.0 lb

## 2020-01-29 DIAGNOSIS — G4733 Obstructive sleep apnea (adult) (pediatric): Secondary | ICD-10-CM | POA: Diagnosis not present

## 2020-01-29 DIAGNOSIS — J069 Acute upper respiratory infection, unspecified: Secondary | ICD-10-CM | POA: Diagnosis not present

## 2020-01-29 DIAGNOSIS — F17210 Nicotine dependence, cigarettes, uncomplicated: Secondary | ICD-10-CM | POA: Diagnosis not present

## 2020-01-29 MED ORDER — PREDNISONE 10 MG (21) PO TBPK: ORAL_TABLET | ORAL | 0 refills | Status: DC

## 2020-01-29 MED ORDER — CETIRIZINE HCL 10 MG PO TABS: 10.0000 mg | ORAL_TABLET | Freq: Every day | ORAL | 0 refills | Status: DC

## 2020-01-29 MED ORDER — BENZONATATE 100 MG PO CAPS: 100.0000 mg | ORAL_CAPSULE | Freq: Three times a day (TID) | ORAL | 0 refills | Status: AC | PRN

## 2020-01-29 NOTE — Progress Notes (Signed)
Virtual Visit via telephone Note  This visit type was conducted due to national recommendations for restrictions regarding the COVID-19 pandemic (e.g. social distancing).  This format is felt to be most appropriate for this patient at this time.  All issues noted in this document were discussed and addressed.  No physical exam was performed (except for noted visual exam findings with Video Visits).   I connected with@ on 01/31/20 at  2:30 PM EDT by a video enabled telemedicine application or telephone and verified that I am speaking with the correct person using two identifiers. Location patient: home Location provider: work  Persons participating in the virtual visit: patient, provider  I discussed the limitations, risks, security and privacy concerns of performing an evaluation and management service by telephone and the availability of in person appointments. I also discussed with the patient that there may be a patient responsible charge related to this service. The patient expressed understanding and agreed to proceed.  The patient did not have access to video capability.  We continued and completed visit with audio only.  Reason for visit: URI  HPI: Onset 7 days ago of a cough with mild wheezing and it affected her the most at night when she is laying down. No chest  tightness but she is using her inhaler twice a day for wheezing. No chest pain or any SOB. She has no allergy symptoms and has noted no sore throat,  nasal congestion, HA, fever/chills, body aches, or GI symptoms. She feels well except for the cough. She ordered Mucinex, honey and lemon with hot tea- helps. It loosens up mucous and then she coughs up clear or white in color. No green or yellow.  No Covid vaccines. Smoke 1/2 ppd for 23 years and touch of asthma and mild COPD. She uses CPAP nightly.   ROS: See pertinent positives and negatives per HPI.  Past Medical History:  Diagnosis Date  . Brain aneurysm 06/29/2015   a.  s/p craniotomy.  . Chest pain    a. 09/2019 Cardiac CT: Ca2+ score = Zero.  . Chicken pox   . Headache   . History of echocardiogram    a. 12/2018 Echo: EF 60-65%. DD. Mild LAE.  Marland Kitchen Hypertension   . PSVT (paroxysmal supraventricular tachycardia) ([place])    a. 08/2019 [facility]io: Avg HR 84 (NSR). 4 beats WCT (likely SVT w/ aberrancy). 4 SVT episodes, longes 9.9 secs. Rare PACs/PVCs (<1%). Some triggered events = SVT and/or PVCs.  . Sleep apnea 10/2018  . Thyroid nodule greater than or equal to 1.5 cm in diameter incidentally noted on imaging study    a. 09/2019 CTA Chest: Ill-defined 2.5cm L thyroid nodule - rec f/u U/S.    Past Surgical History:  Procedure Laterality Date  . CRANIOTOMY Right 06/29/2015   Procedure: CRANIOTOMY INTRACRANIAL ANEURYSM CLIPPING;  Surgeon: Kevan Ny Ditty, MD;  Location: [facility];  Service: Neurosurgery;  Laterality: Right;  . IR GENERIC HISTORICAL  05/25/2016   IR ANGIO INTRA EXTRACRAN SEL INTERNAL CAROTID BILAT MOD SED 05/25/2016 Consuella Lose, MD [facility]  . IR GENERIC HISTORICAL  06/29/2015   IR ANGIO INTRA EXTRACRAN SEL INTERNAL CAROTID BILAT MOD SED 06/29/2015 Consuella Lose, MD [facility]  . IR GENERIC HISTORICAL  06/29/2015   IR ANGIO VERTEBRAL SEL VERTEBRAL UNI L MOD SED 06/29/2015 Consuella Lose, MD [facility]  . IR GENERIC HISTORICAL  06/29/2015   IR 3D INDEPENDENT WKST 06/29/2015 Consuella Lose, MD [facility]  . UTERINE FIBROID SURGERY  2020    Family History  Problem Relation Age of Onset  . Hypertension Mother   . Hypertension Father   . Hypertension Maternal Aunt   . Hypertension Maternal Uncle   . Hypertension Paternal Aunt   . Hypertension Paternal Uncle   . Hypertension Maternal Grandmother   . Hypertension Maternal Grandfather   . Hypertension Paternal Grandmother   . Hypertension Paternal Grandfather      Current Outpatient Medications:  .  albuterol (VENTOLIN HFA) 108 (90 Base) MCG/ACT inhaler, Inhale  2 puffs into the lungs every 6 (six) hours as needed for wheezing or shortness of breath., Disp: 18 g, Rfl: 3 .  carvedilol (COREG) 12.5 MG tablet, Take 1 tablet (12.5 mg total) by mouth 2 (two) times daily., Disp: 180 tablet, Rfl: 0 .  chlorthalidone (HYGROTON) 25 MG tablet, Take 25 mg by mouth daily., Disp: , Rfl:  .  cholecalciferol (VITAMIN D3) 25 MCG (1000 UT) tablet, Take 1 tablet (1,000 Units total) by mouth daily., Disp: 90 tablet, Rfl: 3 .  fluticasone-salmeterol (ADVAIR HFA) 230-21 MCG/ACT inhaler, Inhale 2 puffs into the lungs 2 (two) times daily., Disp: 1 Inhaler, Rfl: 12 .  methimazole (TAPAZOLE) 10 MG tablet, Take 1 tablet (10 mg total) by mouth daily., Disp: 30 tablet, Rfl: 6 .  Probiotic Product (FORTIFY PROBIOTIC WOMENS EX ST) CPDR, Take 1 capsule by mouth daily., Disp: 30 capsule, Rfl: 2 .  triamterene-hydrochlorothiazide (DYAZIDE) 37.5-25 MG capsule, Take 1 each (1 capsule total) by mouth daily., Disp: 90 capsule, Rfl: 3 .  benzonatate (TESSALON PERLES) 100 MG capsule, Take 1 capsule (100 mg total) by mouth 3 (three) times daily as needed for up to 7 days for cough., Disp: 21 capsule, Rfl: 0 .  cetirizine (ZYRTEC) 10 MG tablet, Take 1 tablet (10 mg total) by mouth daily., Disp: 30 tablet, Rfl: 0 .  predniSONE (STERAPRED UNI-PAK 21 TAB) 10 MG (21) TBPK tablet, Take 6 tablets on the first day and then decrease by 1 tablet daily until off. You will take 6-5-4-3-2-1off, Disp: 21 tablet, Rfl: 0 .  spironolactone (ALDACTONE) 25 MG tablet, Take 1 tablet (25 mg total) by mouth daily., Disp: 90 tablet, Rfl: 3  EXAM:  VITALS: 240 lb  GENERAL: She sounds like herself and is alert, oriented, and in no acute distress  LUNGS: No signs of respiratory distress, breathing rate appears normal, no obvious gross SOB, gasping or wheezing. Positive coughing noted during the visit.   PSYCH/NEURO: Pleasant and cooperative,sounds cheerful. Speech and thought processing grossly intact.  ASSESSMENT  AND PLAN:  Discussed the following assessment and plan:  Upper respiratory tract infection, unspecified type  Cigarette nicotine dependence without complication  OSA (obstructive sleep apnea)  Cigarette nicotine dependence without complication Advised to quit smoking and given smoking cessation steps to review.  She is thinking about this.  1 week history of cough, clear sputum, no fevers or chills chest pain or shortness of breath.  Slight wheezing.  She has had no Covid vaccines.  Recommended to get Covid testing.  Quarantine until results known. he was given the phone number to call for Covid testing.  Recommend starting on an antihistamine Zyrtec for possible postnasal drainage stimulating cough when she lays down.  We discussed using Tessalon Perles during the daytime to suppress cough, Mucinex DM green label at bedtime.  Because she has a history of mild COPD, and bronchitis, she was given a prednisone taper and advised to continue using her albuterol inhaler as directed.  Patient instructed to seek in-person care if her symptoms worsen, she develops or she develops new symptoms such as fever, shortness of breath, headache, body aches, or her cough does not improve.  She voices understanding.  She is to rest, push fluids, monitor for fever. Continue use of CPAP. She is to self isolate while she is waiting for the Covid test results.  She smokes 1/2 pack per day and that is down from 1 ppd- doing better. She was advised to keep with the  titration. Will send her smoking cessation information sheets.     Please arrange office visit in August to perform your annual physical exam in-person. You do not need the pap test until next year.   I discussed the assessment and treatment plan with the patient. The patient was provided an opportunity to ask questions and all were answered. The patient agreed with the plan and demonstrated an understanding of the instructions.   The patient was advised  to call back or seek an in-person evaluation if the symptoms worsen or if the condition fails to improve as anticipated.  I provided 22 minutes of non-face-to-face time during this encounter.

## 2020-01-29 NOTE — Patient Instructions (Addendum)
Recommend that you get  tested for Covid. Self isolate while waiting for the Covid test results. Since you have a cough, you must wear a mask when you are out of the house. Limit activity to medical reasons until you know your Covid is negative.  Begin an antihistamine Zyrtec for possible postnasal drainage stimulating cough when you lay down. You may use your nasal spray- Nasonex as well. If you need refills- let me know.    Use the Tessalon Perles during the daytime to suppress cough, Mucinex DM green label at bedtime.   Begin a prednisone taper and advised to continue using her albuterol inhaler as directed.    Please rest, push fluids, and you are smoking less now- 1/2 pack per day and that is good. Keep going.  Use your CPAP. See advice below.    Please seek in-person care if your symptoms worsen, or she develops new symptoms such as fever, shortness of breath, headache, body aches, or your cough does not improve.    Please arrange office visit in August to perform your annual physical exam in-person. You do not need the pap test until next year.     Upper Respiratory Infection, Adult An upper respiratory infection (URI) is a common viral infection of the nose, throat, and upper air passages that lead to the lungs. The most common type of URI is the common cold. URIs usually get better on their own, without medical treatment. What are the causes? A URI is caused by a virus. You may catch a virus by:  Breathing in droplets from an infected person's cough or sneeze.  Touching something that has been exposed to the virus (contaminated) and then touching your mouth, nose, or eyes. What increases the risk? You are more likely to get a URI if:  You are very young or very old.  It is autumn or winter.  You have close contact with others, such as at a daycare, school, or health care facility.  You smoke.  You have long-term (chronic) heart or lung disease.  You have a weakened  disease-fighting (immune) system.  You have nasal allergies or asthma.  You are experiencing a lot of stress.  You work in an area that has poor air circulation.  You have poor nutrition. What are the signs or symptoms? A URI usually involves some of the following symptoms:  Runny or stuffy (congested) nose.  Sneezing.  Cough.  Sore throat.  Headache.  Fatigue.  Fever.  Loss of appetite.  Pain in your forehead, behind your eyes, and over your cheekbones (sinus pain).  Muscle aches.  Redness or irritation of the eyes.  Pressure in the ears or face. How is this diagnosed? This condition may be diagnosed based on your medical history and symptoms, and a physical exam. Your health care provider may use a cotton swab to take a mucus sample from your nose (nasal swab). This sample can be tested to determine what virus is causing the illness. How is this treated? URIs usually get better on their own within 7-10 days. You can take steps at home to relieve your symptoms. Medicines cannot cure URIs, but your health care provider may recommend certain medicines to help relieve symptoms, such as:  Over-the-counter cold medicines.  Cough suppressants. Coughing is a type of defense against infection that helps to clear the respiratory system, so take these medicines only as recommended by your health care provider.  Fever-reducing medicines. Follow these instructions at home: Activity  Rest as needed.  If you have a fever, stay home from work or school until your fever is gone or until your health care provider says you are no longer contagious. Your health care provider may have you wear a face mask to prevent your infection from spreading. Relieving symptoms  Gargle with a salt-water mixture 3-4 times a day or as needed. To make a salt-water mixture, completely dissolve -1 tsp of salt in 1 cup of warm water.  Use a cool-mist humidifier to add moisture to the air. This can  help you breathe more easily. Eating and drinking   Drink enough fluid to keep your urine pale yellow.  Eat soups and other clear broths. General instructions   Take over-the-counter and prescription medicines only as told by your health care provider. These include cold medicines, fever reducers, and cough suppressants.  Do not use any products that contain nicotine or tobacco, such as cigarettes and e-cigarettes. If you need help quitting, ask your health care provider.  Stay away from secondhand smoke.  Stay up to date on all immunizations, including the yearly (annual) flu vaccine.  Keep all follow-up visits as told by your health care provider. This is important. How to prevent the spread of infection to others   URIs can be passed from person to person (are contagious). To prevent the infection from spreading: ? Wash your hands often with soap and water. If soap and water are not available, use hand sanitizer. ? Avoid touching your mouth, face, eyes, or nose. ? Cough or sneeze into a tissue or your sleeve or elbow instead of into your hand or into the air. Contact a health care provider if:  You are getting worse instead of better.  You have a fever or chills.  Your mucus is brown or red.  You have yellow or brown discharge coming from your nose.  You have pain in your face, especially when you bend forward.  You have swollen neck glands.  You have pain while swallowing.  You have white areas in the back of your throat. Get help right away if:  You have shortness of breath that gets worse.  You have severe or persistent: ? Headache. ? Ear pain. ? Sinus pain. ? Chest pain.  You have chronic lung disease along with any of the following: ? Wheezing. ? Prolonged cough. ? Coughing up blood. ? A change in your usual mucus.  You have a stiff neck.  You have changes in your: ? Vision. ? Hearing. ? Thinking. ? Mood. Summary  An upper respiratory  infection (URI) is a common infection of the nose, throat, and upper air passages that lead to the lungs.  A URI is caused by a virus.  URIs usually get better on their own within 7-10 days.  Medicines cannot cure URIs, but your health care provider may recommend certain medicines to help relieve symptoms. This information is not intended to replace advice given to you by your health care provider. Make sure you discuss any questions you have with your health care provider. Document Revised: 08/07/2018 Document Reviewed: 03/15/2017 Elsevier Patient Education  2020 [employer].  Steps to Quit Smoking Smoking tobacco is the leading cause of preventable death. It can affect almost every organ in the body. Smoking puts you and people around you at risk for many serious, long-lasting (chronic) diseases. Quitting smoking can be hard, but it is one of the best things that you can do for your health. It  is never too late to quit. How do I get ready to quit? When you decide to quit smoking, make a plan to help you succeed. Before you quit:  Pick a date to quit. Set a date within the next 2 weeks to give you time to prepare.  Write down the reasons why you are quitting. Keep this list in places where you will see it often.  Tell your family, friends, and co-workers that you are quitting. Their support is important.  Talk with your doctor about the choices that may help you quit.  Find out if your health insurance will pay for these treatments.  Know the people, places, things, and activities that make you want to smoke (triggers). Avoid them. What first steps can I take to quit smoking?  Throw away all cigarettes at home, at work, and in your car.  Throw away the things that you use when you smoke, such as ashtrays and lighters.  Clean your car. Make sure to empty the ashtray.  Clean your home, including curtains and carpets. What can I do to help me quit smoking? Talk with your doctor  about taking medicines and seeing a counselor at the same time. You are more likely to succeed when you do both.  If you are pregnant or breastfeeding, talk with your doctor about counseling or other ways to quit smoking. Do not take medicine to help you quit smoking unless your doctor tells you to do so. To quit smoking: Quit right away  Quit smoking totally, instead of slowly cutting back on how much you smoke over a period of time.  Go to counseling. You are more likely to quit if you go to counseling sessions regularly. Take medicine You may take medicines to help you quit. Some medicines need a prescription, and some you can buy over-the-counter. Some medicines may contain a drug called nicotine to replace the nicotine in cigarettes. Medicines may:  Help you to stop having the desire to smoke (cravings).  Help to stop the problems that come when you stop smoking (withdrawal symptoms). Your doctor may ask you to use:  Nicotine patches, gum, or lozenges.  Nicotine inhalers or sprays.  Non-nicotine medicine that is taken by mouth. Find resources Find resources and other ways to help you quit smoking and remain smoke-free after you quit. These resources are most helpful when you use them often. They include:  Online chats with a Social worker.  Phone quitlines.  Printed Furniture conservator/restorer.  Support groups or group counseling.  Text messaging programs.  Mobile phone apps. Use apps on your mobile phone or tablet that can help you stick to your quit plan. There are many free apps for mobile phones and tablets as well as websites. Examples include Quit Guide from the [employer] and smokefree.gov  What things can I do to make it easier to quit?   Talk to your family and friends. Ask them to support and encourage you.  Call a phone quitline (1-800-QUIT-NOW), reach out to support groups, or work with a Social worker.  Ask people who smoke to not smoke around you.  Avoid places that make you  want to smoke, such as: ? Bars. ? Parties. ? Smoke-break areas at work.  Spend time with people who do not smoke.  Lower the stress in your life. Stress can make you want to smoke. Try these things to help your stress: ? Getting regular exercise. ? Doing deep-breathing exercises. ? Doing yoga. ? Meditating. ? Doing a  body scan. To do this, close your eyes, focus on one area of your body at a time from head to toe. Notice which parts of your body are tense. Try to relax the muscles in those areas. How will I feel when I quit smoking? Day 1 to 3 weeks Within the first 24 hours, you may start to have some problems that come from quitting tobacco. These problems are very bad 2-3 days after you quit, but they do not often last for more than 2-3 weeks. You may get these symptoms:  Mood swings.  Feeling restless, nervous, angry, or annoyed.  Trouble concentrating.  Dizziness.  Strong desire for high-sugar foods and nicotine.  Weight gain.  Trouble pooping (constipation).  Feeling like you may vomit (nausea).  Coughing or a sore throat.  Changes in how the medicines that you take for other issues work in your body.  Depression.  Trouble sleeping (insomnia). Week 3 and afterward After the first 2-3 weeks of quitting, you may start to notice more positive results, such as:  Better sense of smell and taste.  Less coughing and sore throat.  Slower heart rate.  Lower blood pressure.  Clearer skin.  Better breathing.  Fewer sick days. Quitting smoking can be hard. Do not give up if you fail the first time. Some people need to try a few times before they succeed. Do your best to stick to your quit plan, and talk with your doctor if you have any questions or concerns. Summary  Smoking tobacco is the leading cause of preventable death. Quitting smoking can be hard, but it is one of the best things that you can do for your health.  When you decide to quit smoking, make a  plan to help you succeed.  Quit smoking right away, not slowly over a period of time.  When you start quitting, seek help from your doctor, family, or friends. This information is not intended to replace advice given to you by your health care provider. Make sure you discuss any questions you have with your health care provider. Document Revised: 04/24/2019 Document Reviewed: 10/18/2018 Elsevier Patient Education  [employer].

## 2020-01-31 ENCOUNTER — Encounter: Payer: Self-pay | Admitting: Nurse Practitioner

## 2020-01-31 DIAGNOSIS — F17210 Nicotine dependence, cigarettes, uncomplicated: Secondary | ICD-10-CM | POA: Insufficient documentation

## 2020-01-31 NOTE — Assessment & Plan Note (Signed)
She forgets to use her CPAP often.  Patient advised to use the CPAP every day and rationale explained.

## 2020-01-31 NOTE — Assessment & Plan Note (Signed)
Advised to quit smoking and given smoking cessation steps to review.  She is thinking about this.

## 2020-01-31 NOTE — Assessment & Plan Note (Signed)
Telephone visit: 1 week history of cough, clear sputum, no fevers or chills or chest pain.  Slight wheezing.  Cough is worse at night.  She denies history of allergies.  Does not use nose spray. Add Zyrtec for possible postnasal drainage stimulating cough when she lays down.  Tessalon Perles during the daytime to suppress cough, Mucinex DM green label at bedtime.  Because she has a history of mild COPD, and bronchitis,  she was given a prednisone taper and advised to continue using her albuterol inhaler as directed.  Patient instructed to seek in-person care if her symptoms worsen.  Smoking cessation discussed.  Given handout in my chart.

## 2020-02-02 ENCOUNTER — Ambulatory Visit: Payer: BC Managed Care – PPO | Admitting: Internal Medicine

## 2020-02-05 ENCOUNTER — Ambulatory Visit: Payer: BC Managed Care – PPO | Admitting: Internal Medicine

## 2020-02-20 ENCOUNTER — Other Ambulatory Visit: Payer: Self-pay | Admitting: Nurse Practitioner

## 2020-04-22 ENCOUNTER — Other Ambulatory Visit (INDEPENDENT_AMBULATORY_CARE_PROVIDER_SITE_OTHER): Payer: BC Managed Care – PPO

## 2020-04-22 ENCOUNTER — Encounter: Payer: Self-pay | Admitting: Internal Medicine

## 2020-04-22 ENCOUNTER — Other Ambulatory Visit: Payer: Self-pay | Admitting: Internal Medicine

## 2020-04-22 ENCOUNTER — Other Ambulatory Visit: Payer: Self-pay

## 2020-04-22 ENCOUNTER — Ambulatory Visit (INDEPENDENT_AMBULATORY_CARE_PROVIDER_SITE_OTHER): Payer: BC Managed Care – PPO | Admitting: Internal Medicine

## 2020-04-22 VITALS — BP 130/82 | HR 96 | Ht 62.99 in | Wt 254.4 lb

## 2020-04-22 DIAGNOSIS — E041 Nontoxic single thyroid nodule: Secondary | ICD-10-CM

## 2020-04-22 DIAGNOSIS — E0590 Thyrotoxicosis, unspecified without thyrotoxic crisis or storm: Secondary | ICD-10-CM

## 2020-04-22 LAB — T4, FREE: Free T4: 0.74 ng/dL (ref 0.60–1.60)

## 2020-04-22 LAB — TSH: TSH: 4.58 u[IU]/mL — ABNORMAL HIGH (ref 0.35–4.50)

## 2020-04-22 MED ORDER — METHIMAZOLE 5 MG PO TABS: 5.0000 mg | ORAL_TABLET | Freq: Every day | ORAL | 1 refills | Status: DC

## 2020-04-22 NOTE — Progress Notes (Signed)
Name: JENNAYA POGUE  MRN/ DOB: [id number], 05/18/1976    Age/ Sex: 44 y.o., female     PCP: Marval Regal, NP   Reason for Endocrinology Evaluation: Hyperthyroidism     Initial Endocrinology Clinic Visit: 10/29/2019    PATIENT IDENTIFIER: [name] is a 44 y.o., female with a past medical history of HTN, brain aneurysm, paroxysmal SVT and OSA. She has followed with [facility] since 10/29/2019 for consultative assistance with management of her hyperthyroidism.   HISTORICAL SUMMARY:   Patient has been noted with hyperthyroidism in 09/2019 during evaluation of resistant hypertension.  Her TSH was 0.270 uIU/mL , with an elevated free T4 at 1.25 ng/dL patient was also noted to have an incidental left thyroid nodule on CT scan which was ordered for evaluation of shortness of breath.   Methimazole was started 09/2019   TRAB was undetectable    A thyroid ultrasound 11/09/2019 showed a left inferior 4.4 cm nodule meeting FNA criteria which showed benign follicular nodule (Bethesda category II) on 11/26/2019 as the pt opted to hold off on uptake and scan at the time.     No history of amiodarone use.  No FH of thyroid disease    SUBJECTIVE:    Today (04/22/2020):  Ms. Holtrop is here for a follow up on toxic thyroid nodule.    Weight gain has been noted    Denies abdominal pain or vomiting Denies fever  Has noted local neck enlargement,   ROS:  As per HPI.   HISTORY:  Past Medical History:  Past Medical History:  Diagnosis Date  . Brain aneurysm 06/29/2015   a. s/p craniotomy.  . Chest pain    a. 09/2019 Cardiac CT: Ca2+ score = Zero.  . Chicken pox   . Headache   . History of echocardiogram    a. 12/2018 Echo: EF 60-65%. DD. Mild LAE.  Marland Kitchen Hypertension   . PSVT (paroxysmal supraventricular tachycardia) ([place])    a. 08/2019 Zio: Avg HR 84 (NSR). 4 beats WCT (likely SVT w/ aberrancy). 4 SVT episodes, longes 9.9 secs. Rare PACs/PVCs  (<1%). Some triggered events = SVT and/or PVCs.  . Sleep apnea 10/2018  . Thyroid nodule greater than or equal to 1.5 cm in diameter incidentally noted on imaging study    a. 09/2019 CTA Chest: Ill-defined 2.5cm L thyroid nodule - rec f/u U/S.   Past Surgical History:  Past Surgical History:  Procedure Laterality Date  . CRANIOTOMY Right 06/29/2015   Procedure: CRANIOTOMY INTRACRANIAL ANEURYSM CLIPPING;  Surgeon: Kevan Ny Ditty, MD;  Location: [facility];  Service: Neurosurgery;  Laterality: Right;  . IR GENERIC HISTORICAL  05/25/2016   IR ANGIO INTRA EXTRACRAN SEL INTERNAL CAROTID BILAT MOD SED 05/25/2016 Consuella Lose, MD [facility]  . IR GENERIC HISTORICAL  06/29/2015   IR ANGIO INTRA EXTRACRAN SEL INTERNAL CAROTID BILAT MOD SED 06/29/2015 Consuella Lose, MD [facility]  . IR GENERIC HISTORICAL  06/29/2015   IR ANGIO VERTEBRAL SEL VERTEBRAL UNI L MOD SED 06/29/2015 Consuella Lose, MD [facility]  . IR GENERIC HISTORICAL  06/29/2015   IR 3D INDEPENDENT WKST 06/29/2015 Consuella Lose, MD [facility]  . UTERINE FIBROID SURGERY  2020    Social History:  reports that she has been smoking. She started smoking about 24 years ago. She has a 10.00 pack-year smoking history. She has never used smokeless tobacco. She reports current alcohol use. She reports that she does not use drugs. Family  History:  Family History  Problem Relation Age of Onset  . Hypertension Mother   . Hypertension Father   . Hypertension Maternal Aunt   . Hypertension Maternal Uncle   . Hypertension Paternal Aunt   . Hypertension Paternal Uncle   . Hypertension Maternal Grandmother   . Hypertension Maternal Grandfather   . Hypertension Paternal Grandmother   . Hypertension Paternal Grandfather      HOME MEDICATIONS: Allergies as of 04/22/2020      Reactions   Amoxicillin    Augmentin [amoxicillin-pot Clavulanate] Hives   Ciprofloxacin Itching   Patient states she starts itching  from inside or internal.   Clavulanic Acid       Medication List       Accurate as of April 22, 2020 11:03 AM. If you have any questions, ask your nurse or doctor.        STOP taking these medications   predniSONE 10 MG (21) Tbpk tablet Commonly known as: STERAPRED UNI-PAK 21 TAB Stopped by: Dorita Sciara, MD     TAKE these medications   Advair HFA 230-21 MCG/ACT inhaler Generic drug: fluticasone-salmeterol Inhale 2 puffs into the lungs 2 (two) times daily.   albuterol 108 (90 Base) MCG/ACT inhaler Commonly known as: VENTOLIN HFA Inhale 2 puffs into the lungs every 6 (six) hours as needed for wheezing or shortness of breath.   carvedilol 12.5 MG tablet Commonly known as: COREG Take 1 tablet (12.5 mg total) by mouth 2 (two) times daily.   cetirizine 10 MG tablet Commonly known as: ZYRTEC TAKE 1 TABLET BY MOUTH EVERY DAY   chlorthalidone 25 MG tablet Commonly known as: HYGROTON Take 25 mg by mouth daily.   cholecalciferol 25 MCG (1000 UNIT) tablet Commonly known as: VITAMIN D3 Take 1 tablet (1,000 Units total) by mouth daily.   Fortify Probiotic Womens Ex St Cpdr Take 1 capsule by mouth daily.   methimazole 10 MG tablet Commonly known as: TAPAZOLE Take 1 tablet (10 mg total) by mouth daily.   spironolactone 25 MG tablet Commonly known as: ALDACTONE Take 1 tablet (25 mg total) by mouth daily.   triamterene-hydrochlorothiazide 37.5-25 MG capsule Commonly known as: DYAZIDE Take 1 each (1 capsule total) by mouth daily.         OBJECTIVE:   PHYSICAL EXAM: VS: BP 130/82 (BP Location: Left Arm, Patient Position: Sitting, Cuff Size: Large)   Pulse 96   Ht 5' 2.99" (1.6 m)   Wt 254 lb 6.4 oz (115.4 kg)   SpO2 99%   BMI 45.08 kg/m    EXAM: General: Pt appears well and is in NAD  Neck: General: Supple without adenopathy. Thyroid: Left thyroid nodule appreciated   Lungs: Clear with good BS bilat with no rales, rhonchi, or wheezes  Heart:  Auscultation: RRR.  Abdomen: Normoactive bowel sounds, soft, nontender, without masses or organomegaly palpable  Extremities:  BL LE: No pretibial edema normal ROM and strength.  Mental Status: Judgment, insight: Intact Orientation: Oriented to time, place, and person Mood and affect: No depression, anxiety, or agitation     DATA REVIEWED:  Results for SHAQUIRA, MOROZ (MRN [id number]) as of 04/22/2020 12:58  Ref. Range 04/22/2020 11:45  TSH Latest Ref Range: 0.35 - 4.50 uIU/mL 4.58 (H)  T4,Free(Direct) Latest Ref Range: 0.60 - 1.60 ng/dL 0.74     Thyroid Ultrasound 11/09/2019    Nodule # 1:  Location: Left; Inferior  Maximum size: 4.4 cm; Other 2 dimensions: 3.4 x 2.5 cm  Composition: solid/almost completely solid (2)  Echogenicity: isoechoic (1)  Shape: not taller-than-wide (0)  Margins: smooth (0)  Echogenic foci: none (0)  ACR TI-RADS total points: 3.  ACR TI-RADS risk category: TR3 (3 points).  ACR TI-RADS recommendations:  Given size (>/= 2.5 cm) and appearance, fine needle aspiration of this mildly suspicious nodule should be considered based on TI-RADS criteria.  _________________________________________________________  There are no concerning lymph nodes identified on this study.  IMPRESSION: 1. Mildly heterogeneous, enlarged thyroid gland as detailed above. 2. Dominant 4.4 cm left-sided thyroid nodule. Fine-needle aspiration is recommended for this thyroid nodule.   ASSESSMENT / PLAN / RECOMMENDATIONS:   1. Hyperthyroidism   - Most likely cause is toxic left thyroid nodule but Graves' disease is another differential diagnosis.  - Repeat TFT's show elevated TSH, will reduce methimazole as below  - Pt is interested in conception at some point, she had questions about safety of methimazole during pregnancy. Discussed the need to switch to PTU during first trimester, we also discussed alternative therapy such as RAI ablation (  given size of nodule > 4 cm I would not recommend this) vs lobectomy ( recommended if nodule is toxic)   Medications  Stop Methimazole 10 mg daily  Start Methimazole 5 mg daily      2. Left thyroid nodule   - Slight local neck symptoms - S/P benign FNA in 11/2019 - Pt is leaning for lobectomy if needed. She understands the need for thyroid uptake and scan first. She would like to consider this after the holidays.  - She understands with lobectomy she may end up on LT-4 replacement for life.  - If no surgery, she will need a f/u ultrasound in 10/2020   F/U in 4 months    Signed electronically by: Mack Guise, MD  [facility]  [facility] [address] [place], [place] [address] Phone: [phone] FAX: [phone]      CC: Marval Regal, NP [address] [place] [address] Phone: [phone]  Fax: [phone]   Return to Endocrinology clinic as below: Future Appointments  Date Time Provider [place]  04/22/2020 11:10 AM Stein Windhorst, Melanie Crazier, MD [id number] None

## 2020-07-14 ENCOUNTER — Encounter: Payer: Self-pay | Admitting: Internal Medicine

## 2020-07-14 ENCOUNTER — Other Ambulatory Visit: Payer: Self-pay

## 2020-07-14 ENCOUNTER — Ambulatory Visit (INDEPENDENT_AMBULATORY_CARE_PROVIDER_SITE_OTHER): Payer: BC Managed Care – PPO | Admitting: Internal Medicine

## 2020-07-14 VITALS — BP 144/84 | HR 99 | Temp 97.8°F | Ht 63.0 in | Wt 263.0 lb

## 2020-07-14 DIAGNOSIS — J441 Chronic obstructive pulmonary disease with (acute) exacerbation: Secondary | ICD-10-CM

## 2020-07-14 DIAGNOSIS — F17210 Nicotine dependence, cigarettes, uncomplicated: Secondary | ICD-10-CM | POA: Diagnosis not present

## 2020-07-14 MED ORDER — FLUTICASONE PROPIONATE HFA 220 MCG/ACT IN AERO: 2.0000 | INHALATION_SPRAY | Freq: Two times a day (BID) | RESPIRATORY_TRACT | 2 refills | Status: DC

## 2020-07-14 MED ORDER — PREDNISONE 20 MG PO TABS: 20.0000 mg | ORAL_TABLET | Freq: Every day | ORAL | 1 refills | Status: DC

## 2020-07-14 NOTE — Progress Notes (Signed)
Name: Bianca Acosta MRN: [id number] DOB: 01-21-1976     CONSULTATION DATE: 2.14.2020 REFERRING MD : Fletcher Anon  CHIEF COMPLAINT: follow up OSA  STUDIES:   08/2018  CXR independently reviewed by Me No acute findings No penumonia No edema No effusions  Sleep Study 09/2018 Mild AHI 10   Chief complaint Follow-up sleep apnea Follow-up tobacco abuse   HISTORY OF PRESENT ILLNESS:  Has a diagnosis of sleep apnea with AHI of 10  Intolerant of CPAP  Patient is noncompliant   She is not comfortable with the mask and rips it off  She has tried the mask fitting referral services and has not helped      Patient actively smoking 1 pack a day for last 25 years  Patient is wheezing bilaterally Patient is noncompliant with Advair as she cannot afford it No signs of infection however still smokes   Smoking Assessment and Cessation Counseling   Upon further questioning, Patient smokes 1 ppd  I have advised patient to quit/stop smoking as soon as possible due to high risk for multiple medical problems  Patient  is NOT willing to quit smoking  I have advised patient that we can assist and have options of Nicotine replacement therapy. I also advised patient on behavioral therapy and can provide oral medication therapy in conjunction with the other therapies  Follow up next Office visit  for assessment of smoking cessation  Smoking cessation counseling advised for 4 minutes         PAST MEDICAL HISTORY :   has a past medical history of Brain aneurysm (06/29/2015), Chest pain, Chicken pox, Headache, History of echocardiogram, Hypertension, PSVT (paroxysmal supraventricular tachycardia) ([place]), Sleep apnea (10/2018), and Thyroid nodule greater than or equal to 1.5 cm in diameter incidentally noted on imaging study.  has a past surgical history that includes Craniotomy (Right, 06/29/2015); ir generic historical (05/25/2016); ir generic historical (06/29/2015); ir generic  historical (06/29/2015); ir generic historical (06/29/2015); and Uterine fibroid surgery (2020). Prior to Admission medications   Medication Sig Start Date End Date Taking? Authorizing Provider  albuterol (PROVENTIL HFA;VENTOLIN HFA) 108 (90 Base) MCG/ACT inhaler Inhale 2 puffs into the lungs every 6 (six) hours as needed for wheezing or shortness of breath. 08/29/18  Yes Guse, Jacquelynn Cree, FNP  bisacodyl (DULCOLAX) 5 MG EC tablet Take 1 tablet (5 mg total) by mouth daily as needed for moderate constipation. 09/26/18  Yes Guse, Jacquelynn Cree, FNP  chlorthalidone (HYGROTON) 25 MG tablet Take 1 tablet (25 mg total) by mouth daily. 09/12/18 12/11/18 Yes Wellington Hampshire, MD  cholecalciferol (VITAMIN D3) 25 MCG (1000 UT) tablet Take 1 tablet (1,000 Units total) by mouth daily. 09/26/18  Yes Guse, Jacquelynn Cree, FNP  nebivolol (BYSTOLIC) 10 MG tablet Take 1 tablet (10 mg total) by mouth daily. 09/26/18  Yes Guse, Jacquelynn Cree, FNP  omeprazole (PRILOSEC) 20 MG capsule Take 1 capsule (20 mg total) by mouth daily. 09/17/18  Yes Guse, Jacquelynn Cree, FNP  potassium chloride SA (K-DUR,KLOR-CON) 20 MEQ tablet Take 1 tablet (20 mEq total) by mouth daily. 09/23/18  Yes Wellington Hampshire, MD  Probiotic Product (FORTIFY PROBIOTIC WOMENS EX ST) CPDR Take 1 capsule by mouth daily. 09/17/18  Yes Guse, Jacquelynn Cree, FNP  spironolactone (ALDACTONE) 25 MG tablet Take 1 tablet (25 mg total) by mouth daily. 09/12/18 12/11/18 Yes Wellington Hampshire, MD   Allergies  Allergen Reactions  . Amoxicillin   . Augmentin [Amoxicillin-Pot Clavulanate] Hives  . Ciprofloxacin Itching  Patient states she starts itching from inside or internal.  . Clavulanic Acid      Review of Systems:  Gen:  Denies  fever, sweats, chills weight loss  HEENT: Denies blurred vision, double vision, ear pain, eye pain, hearing loss, nose bleeds, sore throat Cardiac:  No dizziness, chest pain or heaviness, chest tightness,edema, No JVD Resp:   No cough, -sputum production, +shortness  of breath,+wheezing, -hemoptysis,  Gi: Denies swallowing difficulty, stomach pain, nausea or vomiting, diarrhea, constipation, bowel incontinence Gu:  Denies bladder incontinence, burning urine Ext:   Denies Joint pain, stiffness or swelling Skin: Denies  skin rash, easy bruising or bleeding or hives Endoc:  Denies polyuria, polydipsia , polyphagia or weight change Psych:   Denies depression, insomnia or hallucinations  Other:  All other systems negative   BP (!) 144/84 (BP Location: Left Arm, Cuff Size: Normal)   Pulse 99   Temp 97.8 F (36.6 C) (Temporal)   Ht 5\' 3"  (1.6 m)   Wt 263 lb (119.3 kg)   SpO2 100%   BMI 46.59 kg/m    Physical Examination:   General Appearance: No distress  Neuro:without focal findings,  speech normal,  HEENT: PERRLA, EOM intact.   Pulmonary: normal breath sounds, + wheezing.  CardiovascularNormal S1,S2.  No m/r/g.   Abdomen: Benign, Soft, non-tender. Renal:  No costovertebral tenderness  GU:  Not performed at this time. Endoc: No evident thyromegaly Skin:   warm, no rashes, no ecchymosis  Extremities: normal, no cyanosis, clubbing. PSYCHIATRIC: Mood, affect within normal limits.     ALL OTHER ROS ARE NEGATIVE    ASSESSMENT / PLAN:   Patient with sleep apnea however is noncompliant with CPAP Patient's main issue is the mask and she has tried several masks I have explained to her the risks and benefits of noncompliance of CPAP therapy Patient is at high risk for arrhythmias Patient is at high risk for progressive heart failure Patient is willing to restart her CPAP therapy   Reactive airways disease with intermittent wheezing Will prescribe prednisone therapy 20 mg daily for 10 days Patient cannot afford Advair HFA so we will start with Flovent HFA 220 mics   Smoking cessation strongly advised   Obesity -recommend significant weight loss -recommend changing diet  Deconditioned state -Recommend increased daily activity and  exercise    A. fib with hyper thyroidism Continue therapy   Follow-up cardiology as scheduled     COVID-19 EDUCATION: The signs and symptoms of COVID-19 were discussed with the patient and how to seek care for testing.  The importance of social distancing was discussed today. Hand Washing Techniques and avoid touching face was advised.  MEDICATION ADJUSTMENTS/LABS AND TESTS ORDERED: Start Advair HFA Continue albuterol as needed Please stop smoking Please restart CPAP therapy Follow-up with endocrinology for hyperthyroidism Follow-up with cardiology   CURRENT MEDICATIONS REVIEWED AT LENGTH WITH PATIENT TODAY  Follow-up 6 months  Total time spent 33 minutes  Kay Ricciuti Patricia Pesa, M.D.  [facility]  Medical Director [place] Director [facility] Cardio-Pulmonary Department

## 2020-07-14 NOTE — Patient Instructions (Addendum)
FLOVENT HFA Continue albuterol as needed Please stop smoking Please restart CPAP therapy Follow-up with endocrinology for hyperthyroidism Follow-up with cardiology

## 2020-07-15 ENCOUNTER — Other Ambulatory Visit
Admission: RE | Admit: 2020-07-15 | Discharge: 2020-07-15 | Disposition: A | Discharge status: Home / Self Care | Payer: BC Managed Care – PPO | Source: Ambulatory Visit | Attending: Nurse Practitioner | Admitting: Nurse Practitioner

## 2020-07-15 ENCOUNTER — Encounter: Payer: Self-pay | Admitting: Nurse Practitioner

## 2020-07-15 ENCOUNTER — Ambulatory Visit (INDEPENDENT_AMBULATORY_CARE_PROVIDER_SITE_OTHER): Payer: BC Managed Care – PPO | Admitting: Nurse Practitioner

## 2020-07-15 VITALS — BP 128/68 | HR 96 | Ht 63.0 in | Wt 261.0 lb

## 2020-07-15 DIAGNOSIS — Z8639 Personal history of other endocrine, nutritional and metabolic disease: Secondary | ICD-10-CM | POA: Insufficient documentation

## 2020-07-15 DIAGNOSIS — G4733 Obstructive sleep apnea (adult) (pediatric): Secondary | ICD-10-CM

## 2020-07-15 DIAGNOSIS — Z720 Tobacco use: Secondary | ICD-10-CM

## 2020-07-15 DIAGNOSIS — I10 Essential (primary) hypertension: Secondary | ICD-10-CM

## 2020-07-15 DIAGNOSIS — E0590 Thyrotoxicosis, unspecified without thyrotoxic crisis or storm: Secondary | ICD-10-CM | POA: Diagnosis not present

## 2020-07-15 DIAGNOSIS — I471 Supraventricular tachycardia: Secondary | ICD-10-CM

## 2020-07-15 LAB — BASIC METABOLIC PANEL
Anion gap: 12 (ref 5–15)
BUN: 18 mg/dL (ref 6–20)
CO2: 23 mmol/L (ref 22–32)
Calcium: 9.8 mg/dL (ref 8.9–10.3)
Chloride: 102 mmol/L (ref 98–111)
Creatinine, Ser: 1.05 mg/dL — ABNORMAL HIGH (ref 0.44–1.00)
GFR, Estimated: >60 mL/min (ref >60)
Glucose, Bld: 134 mg/dL — ABNORMAL HIGH (ref 70–99)
Potassium: 4.2 mmol/L (ref 3.5–5.1)
Sodium: 137 mmol/L (ref 135–145)

## 2020-07-15 NOTE — Patient Instructions (Addendum)
Medication Instructions:  Your physician recommends that you continue on your current medications as directed. Please refer to the Current Medication list given to you today.  *If you need a refill on your cardiac medications before your next appointment, please call your pharmacy*   Lab Work: Please go to the [facility] today to have a Basic Metabolic Panel drawn.  If you have labs (blood work) drawn today and your tests are completely normal, you will receive your results only by: Marland Kitchen MyChart Message (if you have MyChart) OR . A paper copy in the mail If you have any lab test that is abnormal or we need to change your treatment, we will call you to review the results.   Testing/Procedures: None ordered.   Follow-Up: At [facility], you and your health needs are our priority.  As part of our continuing mission to provide you with exceptional heart care, we have created designated Provider Care Teams.  These Care Teams include your primary Cardiologist (physician) and Advanced Practice Providers (APPs -  Physician Assistants and Nurse Practitioners) who all work together to provide you with the care you need, when you need it.  We recommend signing up for the patient portal called "MyChart".  Sign up information is provided on this After Visit Summary.  MyChart is used to connect with patients for Virtual Visits (Telemedicine).  Patients are able to view lab/test results, encounter notes, upcoming appointments, etc.  Non-urgent messages can be sent to your provider as well.   To learn more about what you can do with MyChart, go to [email or web address].    Your next appointment:   6 month(s)  The format for your next appointment:   In Person  Provider:   You may see Kathlyn [place], MD or one of the following Advanced Practice Providers on your designated Care Team:    Murray Hodgkins, NP  Christell Faith, PA-C  Marrianne Mood, PA-C  Cadence [place], [place]  Laurann [place],  NP

## 2020-07-15 NOTE — Progress Notes (Signed)
Office Visit    Patient Name: Bianca Acosta Date of Encounter: 07/15/2020  Primary Care Provider:  Marval Regal, NP Primary Cardiologist:  Kathlyn Sacramento, MD  Chief Complaint    44 year old female with a history of hypertension, tobacco abuse, obesity, sleep apnea, PSVT, chest pain with coronary calcium score of zero, thyroid nodule, and brain aneurysm status post craniotomy in 2016, who presents for follow-up related to HTN.  Past Medical History    Past Medical History:  Diagnosis Date  . Brain aneurysm 06/29/2015   a. s/p craniotomy.  . Chest pain    a. 09/2019 Cardiac CT: Ca2+ score = Zero.  . Chicken pox   . Headache   . History of echocardiogram    a. 12/2018 Echo: EF 60-65%. DD. Mild LAE; b. 11/2019 Echo: EF 60-65%, no rwma, nl PASP.   Marland Kitchen Hypertension   . PSVT (paroxysmal supraventricular tachycardia) ([place])    a. 08/2019 Zio: Avg HR 84 (NSR). 4 beats WCT (likely SVT w/ aberrancy). 4 SVT episodes, longes 9.9 secs. Rare PACs/PVCs (<1%). Some triggered events = SVT and/or PVCs.  . Sleep apnea 10/2018   a. Does not tolerate CPAP.  Marland Kitchen Thyroid nodule greater than or equal to 1.5 cm in diameter incidentally noted on imaging study    a. 09/2019 CTA Chest: Ill-defined 2.5cm L thyroid nodule - rec f/u U/S; b. 10/2019 Thyroid U/S: 4.4cm L-sided thyroid nodule. FNA rec; c. 11/2019 FNA benign.  . Tobacco abuse    Past Surgical History:  Procedure Laterality Date  . CRANIOTOMY Right 06/29/2015   Procedure: CRANIOTOMY INTRACRANIAL ANEURYSM CLIPPING;  Surgeon: Kevan Ny Ditty, MD;  Location: [facility];  Service: Neurosurgery;  Laterality: Right;  . IR GENERIC HISTORICAL  05/25/2016   IR ANGIO INTRA EXTRACRAN SEL INTERNAL CAROTID BILAT MOD SED 05/25/2016 Consuella Lose, MD [facility]  . IR GENERIC HISTORICAL  06/29/2015   IR ANGIO INTRA EXTRACRAN SEL INTERNAL CAROTID BILAT MOD SED 06/29/2015 Consuella Lose, MD [facility]  . IR GENERIC HISTORICAL  06/29/2015    IR ANGIO VERTEBRAL SEL VERTEBRAL UNI L MOD SED 06/29/2015 Consuella Lose, MD [facility]  . IR GENERIC HISTORICAL  06/29/2015   IR 3D INDEPENDENT WKST 06/29/2015 Consuella Lose, MD [facility]  . UTERINE FIBROID SURGERY  2020    Allergies  Allergies  Allergen Reactions  . Amoxicillin   . Augmentin [Amoxicillin-Pot Clavulanate] Hives  . Ciprofloxacin Itching    Patient states she starts itching from inside or internal.  . Clavulanic Acid     History of Present Illness    44 year old female with above complex past medical history including hypertension, tobacco abuse, obesity, sleep apnea (CPAP noncompliant), PSVT, chest pain, thyroid nodule, and brain aneurysm status post craniotomy in 2016.  She has had difficult to control hypertension dating back to her 40s.  Echocardiogram in May 2020 showed normal LV function with diastolic dysfunction and mild left atrial enlargement.  In January 2020 when she had an episode of chest pain and more frequent palpitations.  Cardiac CT for calcium scoring was performed with a calcium score of 0.  Event monitoring showed 4 beats of wide-complex tachycardia felt to be SVT with aberrancy along with 4 episodes of SVT, longest lasting 9.9 seconds.  Some triggered events were associated SVT and/or PVCs.  She was subsequently found to be hyperthyroid with a TSH of 0.27 and a free T4 of 1.25 and placed on methimazole and atenolol and referred to endocrine.  She was also incidentally noted to have a left thyroid nodule on CT and this was followed by a thyroid ultrasound showing a 4.4 cm left-sided thyroid nodule.  FNA was performed in April 2021 and was benign.  She has since been followed closely by endocrinology and is considering lobectomy.  Prior to her cardiology visit in March 2021, we had to stop her chlorthalidone due to ongoing hypokalemia despite supplementation.  This resulted in a 14 pound weight gain.  She reported that she had done well with  triamterene HCTZ in the past and this was resumed with plan for close follow-up in the setting of concomitant spironolactone usage (renal function potassium stable November 12, 2019).  At return video visit in April, she reported improvement in edema and blood pressure was stable.  Since her last cardiology visit, she notes that she has done quite well.  Her blood pressures are stable when she checks them at home and she is 128/68 today.  She has not had any recurrence of swelling.  She was recently seen by pulmonology in the setting of sleep apnea and was felt to have mild COPD with wheezing on examination.  She was strongly encouraged to quit smoking and also provided a 10-day course of prednisone.  She has no complaints today.  Work has been stressful, especially in the setting of the holiday season (works at [employer]) but she enjoys her work and has not been experiencing chest pain or dyspnea.  She denies palpitations, PND, orthopnea, dizziness, syncope, edema, or early satiety.  Home Medications    Prior to Admission medications   Medication Sig Start Date End Date Taking? Authorizing Provider  albuterol (VENTOLIN HFA) 108 (90 Base) MCG/ACT inhaler Inhale 2 puffs into the lungs every 6 (six) hours as needed for wheezing or shortness of breath. 01/05/20   Marval Regal, NP  carvedilol (COREG) 12.5 MG tablet Take 1 tablet (12.5 mg total) by mouth 2 (two) times daily. 10/12/19   Wellington Hampshire, MD  cetirizine (ZYRTEC) 10 MG tablet TAKE 1 TABLET BY MOUTH EVERY DAY 02/22/20   Marval Regal, NP  cholecalciferol (VITAMIN D3) 25 MCG (1000 UT) tablet Take 1 tablet (1,000 Units total) by mouth daily. 09/26/18   Guse, Jacquelynn Cree, FNP  fluticasone (FLOVENT HFA) 220 MCG/ACT inhaler Inhale 2 puffs into the lungs 2 (two) times daily. 07/14/20 07/14/21  Flora Lipps, MD  methimazole (TAPAZOLE) 5 MG tablet Take 1 tablet (5 mg total) by mouth daily. 04/22/20   Shamleffer, Melanie Crazier, MD  predniSONE (DELTASONE) 20 MG  tablet Take 1 tablet (20 mg total) by mouth daily with breakfast. 10 days 07/14/20   Flora Lipps, MD  Probiotic Product (FORTIFY PROBIOTIC WOMENS EX ST) CPDR Take 1 capsule by mouth daily. 09/17/18   Jodelle Green, FNP  spironolactone (ALDACTONE) 25 MG tablet Take 1 tablet (25 mg total) by mouth daily. 10/01/19 12/30/19  Marrianne Mood D, PA-C  triamterene-hydrochlorothiazide (DYAZIDE) 37.5-25 MG capsule Take 1 each (1 capsule total) by mouth daily. 10/28/19   Theora Gianotti, NP    Review of Systems    She denies chest pain, palpitations, dyspnea, pnd, orthopnea, n, v, dizziness, syncope, edema, weight gain, or early satiety.  All other systems reviewed and are otherwise negative except as noted above.  Physical Exam    VS:  BP 128/68 (BP Location: Left Arm, Patient Position: Sitting, Cuff Size: Large)   Pulse 96   Ht 5\' 3"  (1.6 m)   Wt  261 lb (118.4 kg)   SpO2 97%   BMI 46.23 kg/m  , BMI Body mass index is 46.23 kg/m. GEN: Obese, in no acute distress. HEENT: normal. Neck: Supple, no JVD, carotid bruits, or masses. Cardiac: RRR, no murmurs, rubs, or gallops. No clubbing, cyanosis, edema.  Radials/PT 2+ and equal bilaterally.  Respiratory:  Respirations regular and unlabored, clear to auscultation bilaterally. GI: Soft, nontender, nondistended, BS + x 4. MS: no deformity or atrophy. Skin: warm and dry, no rash. Neuro:  Strength and sensation are intact. Psych: Normal affect.  Accessory Clinical Findings     Lab Results  Component Value Date   WBC 6.4 10/11/2019   HGB 13.8 10/11/2019   HCT 41.0 10/11/2019   MCV 84.2 10/11/2019   PLT 266 10/11/2019   Lab Results  Component Value Date   CREATININE 1.05 (H) 07/15/2020   BUN 18 07/15/2020   NA 137 07/15/2020   K 4.2 07/15/2020   CL 102 07/15/2020   CO2 23 07/15/2020   Lab Results  Component Value Date   ALT 20 10/11/2019   AST 20 10/11/2019   ALKPHOS 51 10/11/2019   BILITOT 0.7 10/11/2019   Lab Results    Component Value Date   CHOL 134 05/16/2018   HDL 49 (L) 05/16/2018   LDLCALC 69 05/16/2018   TRIG 81 05/16/2018   CHOLHDL 2.7 05/16/2018    Lab Results  Component Value Date   HGBA1C 6.0 (H) 10/09/2019   Lab Results  Component Value Date   TSH 4.58 (H) 04/22/2020    Assessment & Plan    1.  Essential hypertension: This is well controlled on beta-blocker, spironolactone, and triamterene-HCTZ.  I did obtain a basic metabolic panel today which shows stable potassium at 4.2.  Her creatinine is mildly elevated at 1.05 and I have encouraged her to increase water intake.  She tells me that she frequently eats a banana and drinks lots of orange juice and in the setting of being on 2 potassium sparing diuretics, I encouraged her to limit consumption of both of those foods.  2.  Hyperthyroidism: Followed closely by endocrinology.  She is considering lobectomy.  Her methimazole dose was actually reduced recently in the setting of mildly elevated TSH.  3.  Morbid obesity: Patient recognizes that she needs to lose weight.  She is considering a program such as weight watchers.  Encouraged regular exercise as well as tobacco cessation.  4.  Tobacco abuse: Complete cessation advised.  5.  Obstructive sleep apnea: She is not compliant with CPAP but understands how important it will be in preventing complications of sleep apnea in the future.  She is going to try CPAP again.  6.  PSVT: Quiescent on beta-blocker therapy.  7.  Disposition: Follow-up in clinic in 6 months or sooner if necessary.  Murray Hodgkins, NP 07/15/2020, 4:48 PM

## 2020-08-15 ENCOUNTER — Telehealth: Payer: Self-pay | Admitting: Internal Medicine

## 2020-08-15 NOTE — Telephone Encounter (Signed)
Patient is returning phone call. Patient phone number is [phone].

## 2020-08-15 NOTE — Telephone Encounter (Signed)
Spoke to patient and relayed below recommendations. She voiced her understanding and had no further questions.  appt scheduled for 09/02/2019 with Dr. Belia Heman, as this worked best with patients schedule.  She would like to discuss referral to med management, PFT and CXR at upcoming visit.  Nothing further needed at this time.

## 2020-08-15 NOTE — Telephone Encounter (Signed)
08/15/2020  Patient was recently treated for COPD exacerbation on 07/14/2020.  She continues to smoke 1 pack/day.  She is also noncompliant with inhalers due to cost.  Would recommend referral to medication management for help with assistance on maintenance inhalers as long-term use of prednisone as a maintenance medication should be strongly avoided.  Would also recommend that patient stop smoking as it will be quite difficult for [place] to manage her breathing adequately if she continues to smoke a pack per day  Would recommend in person office visit with [facility] or office APP.  This can be in [place] or in the [place] office.  Patient likely would benefit from a chest x-ray at that point in time as she does not have any chest imaging since February/2021 which was a CT chest.  Would also recommend the patient obtain pulmonary function testing as she is an active smoker.  I do not see these on file.  Would also recommend the patient obtain COVID-19 vaccinations given the ongoing Covid epidemic.   We have no other additional recommendations for the patient.  Elisha Headland, FNP

## 2020-08-15 NOTE — Telephone Encounter (Signed)
Lm for patient.  

## 2020-08-15 NOTE — Telephone Encounter (Signed)
Dr. Belia Heman patient last seen 07/14/2020 for COPD.  Patient was given a course of prednisone with 1 refill at last OV. She completed course of prednisone on 07/20/2020. She only completed 8 days of 10 day course.  She stated that prednisone Rx is scheduled to be delivered tomorrow. She is questioning if she should start prednisone tomorrow, as she is still experiencing wheezing and occasional dry cough.   Denied fever, chills or swears.  She cannot afford flovent. She is using albuterol HFA BID.  She has not had covid vaccines.   Arlys John, please advise. Dr. Belia Heman is unavailable.

## 2020-08-25 ENCOUNTER — Other Ambulatory Visit: Payer: Self-pay

## 2020-08-26 ENCOUNTER — Ambulatory Visit (INDEPENDENT_AMBULATORY_CARE_PROVIDER_SITE_OTHER): Payer: BC Managed Care – PPO | Admitting: Internal Medicine

## 2020-08-26 VITALS — BP 118/78 | HR 85 | Resp 18 | Ht 63.0 in | Wt 263.0 lb

## 2020-08-26 DIAGNOSIS — E041 Nontoxic single thyroid nodule: Secondary | ICD-10-CM | POA: Diagnosis not present

## 2020-08-26 DIAGNOSIS — E0590 Thyrotoxicosis, unspecified without thyrotoxic crisis or storm: Secondary | ICD-10-CM

## 2020-08-26 DIAGNOSIS — E559 Vitamin D deficiency, unspecified: Secondary | ICD-10-CM | POA: Diagnosis not present

## 2020-08-26 LAB — T4, FREE: Free T4: 0.83 ng/dL (ref 0.60–1.60)

## 2020-08-26 LAB — TSH: TSH: 3.42 u[IU]/mL (ref 0.35–4.50)

## 2020-08-26 LAB — VITAMIN D 25 HYDROXY (VIT D DEFICIENCY, FRACTURES): VITD: 22.29 ng/mL — ABNORMAL LOW (ref 30.00–100.00)

## 2020-08-26 NOTE — Progress Notes (Signed)
Name: Bianca Acosta  MRN/ DOB: [id number], [birth year]    Age/ Sex: 45 y.o., female     PCP: Marval Regal, NP   Reason for Endocrinology Evaluation: Hyperthyroidism     Initial Endocrinology Clinic Visit: 10/29/2019    PATIENT IDENTIFIER: Bianca Acosta is a 45 y.o., female with a past medical history of HTN, brain aneurysm, paroxysmal SVT and OSA. She has followed with [facility] since 10/29/2019 for consultative assistance with management of her hyperthyroidism.   HISTORICAL SUMMARY:   Patient has been noted with hyperthyroidism in 09/2019 during evaluation of resistant hypertension.  Her TSH was 0.270 uIU/mL , with an elevated free T4 at 1.25 ng/dL patient was also noted to have an incidental left thyroid nodule on CT scan which was ordered for evaluation of shortness of breath.   Methimazole was started 09/2019   TRAB was undetectable    A thyroid ultrasound 11/09/2019 showed a left inferior 4.4 cm nodule meeting FNA criteria which showed benign follicular nodule (Bethesda category II) on 11/26/2019. The pt opted to hold off on uptake and scan at the time.     No history of amiodarone use.  No FH of thyroid disease    SUBJECTIVE:    Today (08/26/2020):  Bianca Acosta is here for a follow up on toxic thyroid nodule.    Weight has been stable    Denies abdominal pain or vomiting Denies fever  Has noted local neck enlargement, and attributes this to nodules   Has hx of vitamin D insufficiency , has not taking any vitamin D      HISTORY:  Past Medical History:  Past Medical History:  Diagnosis Date  . Brain aneurysm 06/29/2015   a. s/p craniotomy.  . Chest pain    a. 09/2019 Cardiac CT: Ca2+ score = Zero.  . Chicken pox   . Headache   . History of echocardiogram    a. 12/2018 Echo: EF 60-65%. DD. Mild LAE; b. 11/2019 Echo: EF 60-65%, no rwma, nl PASP.   Marland Kitchen Hypertension   . PSVT (paroxysmal supraventricular tachycardia) ([place])     a. 08/2019 Zio: Avg HR 84 (NSR). 4 beats WCT (likely SVT w/ aberrancy). 4 SVT episodes, longes 9.9 secs. Rare PACs/PVCs (<1%). Some triggered events = SVT and/or PVCs.  . Sleep apnea 10/2018   a. Does not tolerate CPAP.  Marland Kitchen Thyroid nodule greater than or equal to 1.5 cm in diameter incidentally noted on imaging study    a. 09/2019 CTA Chest: Ill-defined 2.5cm L thyroid nodule - rec f/u U/S; b. 10/2019 Thyroid U/S: 4.4cm L-sided thyroid nodule. FNA rec; c. 11/2019 FNA benign.  . Tobacco abuse    Past Surgical History:  Past Surgical History:  Procedure Laterality Date  . CRANIOTOMY Right 06/29/2015   Procedure: CRANIOTOMY INTRACRANIAL ANEURYSM CLIPPING;  Surgeon: Kevan Ny Ditty, MD;  Location: [facility];  Service: Neurosurgery;  Laterality: Right;  . IR GENERIC HISTORICAL  05/25/2016   IR ANGIO INTRA EXTRACRAN SEL INTERNAL CAROTID BILAT MOD SED 05/25/2016 Consuella Lose, MD [facility]  . IR GENERIC HISTORICAL  06/29/2015   IR ANGIO INTRA EXTRACRAN SEL INTERNAL CAROTID BILAT MOD SED 06/29/2015 Consuella Lose, MD [facility]  . IR GENERIC HISTORICAL  06/29/2015   IR ANGIO VERTEBRAL SEL VERTEBRAL UNI L MOD SED 06/29/2015 Consuella Lose, MD [facility]  . IR GENERIC HISTORICAL  06/29/2015   IR 3D INDEPENDENT WKST 06/29/2015 Consuella Lose, MD [facility]  . UTERINE  FIBROID SURGERY  2020    Social History:  reports that she has been smoking. She started smoking about 25 years ago. She has a 20.00 pack-year smoking history. She has never used smokeless tobacco. She reports current alcohol use. She reports that she does not use drugs. Family History:  Family History  Problem Relation Age of Onset  . Hypertension Mother   . Hypertension Father   . Hypertension Maternal Aunt   . Hypertension Maternal Uncle   . Hypertension Paternal Aunt   . Hypertension Paternal Uncle   . Hypertension Maternal Grandmother   . Hypertension Maternal Grandfather   . Hypertension  Paternal Grandmother   . Hypertension Paternal Grandfather      HOME MEDICATIONS: Allergies as of 08/26/2020      Reactions   Amoxicillin    Augmentin [amoxicillin-pot Clavulanate] Hives   Ciprofloxacin Itching   Patient states she starts itching from inside or internal.   Clavulanic Acid       Medication List       Accurate as of August 26, 2020 10:58 AM. If you have any questions, ask your nurse or doctor.        albuterol 108 (90 Base) MCG/ACT inhaler Commonly known as: VENTOLIN HFA Inhale 2 puffs into the lungs every 6 (six) hours as needed for wheezing or shortness of breath.   carvedilol 12.5 MG tablet Commonly known as: COREG Take 1 tablet (12.5 mg total) by mouth 2 (two) times daily.   cetirizine 10 MG tablet Commonly known as: ZYRTEC TAKE 1 TABLET BY MOUTH EVERY DAY   cholecalciferol 25 MCG (1000 UNIT) tablet Commonly known as: VITAMIN D3 Take 1 tablet (1,000 Units total) by mouth daily.   Fortify Probiotic Womens Ex St Cpdr Take 1 capsule by mouth daily.   methimazole 5 MG tablet Commonly known as: TAPAZOLE Take 1 tablet (5 mg total) by mouth daily.   predniSONE 20 MG tablet Commonly known as: DELTASONE Take 1 tablet (20 mg total) by mouth daily with breakfast. 10 days   spironolactone 25 MG tablet Commonly known as: ALDACTONE Take 1 tablet (25 mg total) by mouth daily.   triamterene-hydrochlorothiazide 37.5-25 MG capsule Commonly known as: DYAZIDE Take 1 each (1 capsule total) by mouth daily.         OBJECTIVE:   PHYSICAL EXAM: VS: Ht 5\' 3"  (1.6 m)   Wt 263 lb (119.3 kg)   LMP 07/21/2020   BMI 46.59 kg/m    EXAM: General: Pt appears well and is in NAD  Neck: General: Supple without adenopathy. Thyroid: Left thyroid nodule appreciated   Lungs: Clear with good BS bilat with no rales, rhonchi, or wheezes  Heart: Auscultation: RRR.  Abdomen: Normoactive bowel sounds, soft, nontender, without masses or organomegaly palpable   Extremities:  BL LE: No pretibial edema normal ROM and strength.  Mental Status: Judgment, insight: Intact Orientation: Oriented to time, place, and person Mood and affect: No depression, anxiety, or agitation     DATA REVIEWED:  Results for Bianca Acosta, Bianca Acosta (MRN [id number]) as of 08/30/2020 06:56  Ref. Range 08/26/2020 11:27  VITD Latest Ref Range: 30.00 - 100.00 ng/mL 22.29 (L)  TSH Latest Ref Range: 0.35 - 4.50 uIU/mL 3.42  T4,Free(Direct) Latest Ref Range: 0.60 - 1.60 ng/dL 0.83    Thyroid Ultrasound 11/09/2019    Nodule # 1:  Location: Left; Inferior  Maximum size: 4.4 cm; Other 2 dimensions: 3.4 x 2.5 cm  Composition: solid/almost completely solid (2)  Echogenicity: isoechoic (  1)  Shape: not taller-than-wide (0)  Margins: smooth (0)  Echogenic foci: none (0)  ACR TI-RADS total points: 3.  ACR TI-RADS risk category: TR3 (3 points).  ACR TI-RADS recommendations:  Given size (>/= 2.5 cm) and appearance, fine needle aspiration of this mildly suspicious nodule should be considered based on TI-RADS criteria.  _________________________________________________________  There are no concerning lymph nodes identified on this study.  IMPRESSION: 1. Mildly heterogeneous, enlarged thyroid gland as detailed above. 2. Dominant 4.4 cm left-sided thyroid nodule. Fine-needle aspiration is recommended for this thyroid nodule.   ASSESSMENT / PLAN / RECOMMENDATIONS:   1. Hyperthyroidism   - Most likely cause is toxic left thyroid nodule but Graves' disease is another differential diagnosis.  - TFT results reviewed and will reduce methimazole as below    Medications   Decrease Methimazole 5 mg, half a tablet daily      2. Left thyroid nodule    - S/P benign FNA in 11/2019 - Will repeat thyroid ultrasound      3. Vitamin D insufficiency:   - Will start OTC Vitamin D3 1000 iu daily    F/U in 4 months    Signed electronically  by: Mack Guise, MD  [facility]  [facility] [address] [place], [name] [address] Phone: [phone] FAX: [phone]      CC: Marval Regal, NP No address on file Phone: None  Fax: None   Return to Endocrinology clinic as below: Future Appointments  Date Time Provider [place]  09/01/2020  9:15 AM Flora Lipps, MD [id number] None  01/17/2021  4:00 PM Wellington Hampshire, MD [id number]

## 2020-08-26 NOTE — Patient Instructions (Addendum)
We recommend that you follow these hyperthyroidism instructions at home:  1) Take Methimazole 5 mg once a day- until you hear otherwise from [place]      A thyroid ultrasound has been ordered

## 2020-08-30 MED ORDER — METHIMAZOLE 5 MG PO TABS: 2.5000 mg | ORAL_TABLET | Freq: Every day | ORAL | 1 refills | Status: DC

## 2020-09-01 ENCOUNTER — Encounter: Payer: Self-pay | Admitting: Internal Medicine

## 2020-09-01 ENCOUNTER — Other Ambulatory Visit: Payer: Self-pay

## 2020-09-01 ENCOUNTER — Ambulatory Visit (INDEPENDENT_AMBULATORY_CARE_PROVIDER_SITE_OTHER): Payer: BC Managed Care – PPO | Admitting: Internal Medicine

## 2020-09-01 VITALS — BP 130/86 | HR 87 | Temp 97.0°F | Ht 63.0 in | Wt 262.8 lb

## 2020-09-01 DIAGNOSIS — F17210 Nicotine dependence, cigarettes, uncomplicated: Secondary | ICD-10-CM

## 2020-09-01 DIAGNOSIS — J441 Chronic obstructive pulmonary disease with (acute) exacerbation: Secondary | ICD-10-CM | POA: Diagnosis not present

## 2020-09-01 DIAGNOSIS — Z720 Tobacco use: Secondary | ICD-10-CM

## 2020-09-01 DIAGNOSIS — G4733 Obstructive sleep apnea (adult) (pediatric): Secondary | ICD-10-CM

## 2020-09-01 NOTE — Patient Instructions (Signed)
Reestablish DME company for OSA supplies and therapy Please stop smoking Follow-up with PCP or endocrinology to assess for diabetes Obtain pulmonary function testing to assess for underlying obstructive lung disease Recommend weight loss

## 2020-09-01 NOTE — Progress Notes (Signed)
Name: Bianca Acosta MRN: [id number] DOB: [birth year]     CONSULTATION DATE: 2.14.2020 REFERRING MD : Fletcher Anon    STUDIES:   08/2018  CXR independently reviewed by Me No acute findings No penumonia No edema No effusions  Sleep Study 09/2018 Mild AHI 10   CC Follow-up wheezing and probable COPD Follow-up OSA Follow-up tobacco abuse  HISTORY OF PRESENT ILLNESS:  Regarding her OSA  has a diagnosis of sleep apnea with AHI of 10  Intolerant of CPAP  Patient is noncompliant  She is not comfortable with the mask and rips it off  She has tried the mask fitting referral services and has not helped    Regarding COPD Patient actively smoking 1 pack a day for last 25 years    Smoking Assessment and Cessation Counseling Upon further questioning, Patient smokes 1 PPD I have advised patient to quit/stop smoking as soon as possible due to high risk for multiple medical problems  Patient is willing to quit smoking   I have advised patient that we can assist and have options of Nicotine replacement therapy. I also advised patient on behavioral therapy and can provide oral medication therapy in conjunction with the other therapies Follow up next Office visit  for assessment of smoking cessation Smoking cessation counseling advised for 4 minutes    Patient with intermittent wheezing and shortness of breath She will need pulmonary function testing to assess her lung function and assess for COPD   Patient has acanthosis nigricans on physical exam in the back of her neck recommend following up with endocrinology to assess for diabetes      PAST MEDICAL HISTORY :   has a past medical history of Brain aneurysm (06/29/2015), Chest pain, Chicken pox, Headache, History of echocardiogram, Hypertension, PSVT (paroxysmal supraventricular tachycardia) ([place]), Sleep apnea (10/2018), Thyroid nodule greater than or equal to 1.5 cm in diameter incidentally noted on imaging study, and  Tobacco abuse.  has a past surgical history that includes Craniotomy (Right, 06/29/2015); ir generic historical (05/25/2016); ir generic historical (06/29/2015); ir generic historical (06/29/2015); ir generic historical (06/29/2015); and Uterine fibroid surgery (2020). Prior to Admission medications   Medication Sig Start Date End Date Taking? Authorizing Provider  albuterol (PROVENTIL HFA;VENTOLIN HFA) 108 (90 Base) MCG/ACT inhaler Inhale 2 puffs into the lungs every 6 (six) hours as needed for wheezing or shortness of breath. 08/29/18  Yes Guse, Jacquelynn Cree, FNP  bisacodyl (DULCOLAX) 5 MG EC tablet Take 1 tablet (5 mg total) by mouth daily as needed for moderate constipation. 09/26/18  Yes Guse, Jacquelynn Cree, FNP  chlorthalidone (HYGROTON) 25 MG tablet Take 1 tablet (25 mg total) by mouth daily. 09/12/18 12/11/18 Yes Wellington Hampshire, MD  cholecalciferol (VITAMIN D3) 25 MCG (1000 UT) tablet Take 1 tablet (1,000 Units total) by mouth daily. 09/26/18  Yes Guse, Jacquelynn Cree, FNP  nebivolol (BYSTOLIC) 10 MG tablet Take 1 tablet (10 mg total) by mouth daily. 09/26/18  Yes Guse, Jacquelynn Cree, FNP  omeprazole (PRILOSEC) 20 MG capsule Take 1 capsule (20 mg total) by mouth daily. 09/17/18  Yes Guse, Jacquelynn Cree, FNP  potassium chloride SA (K-DUR,KLOR-CON) 20 MEQ tablet Take 1 tablet (20 mEq total) by mouth daily. 09/23/18  Yes Wellington Hampshire, MD  Probiotic Product (FORTIFY PROBIOTIC WOMENS EX ST) CPDR Take 1 capsule by mouth daily. 09/17/18  Yes Guse, Jacquelynn Cree, FNP  spironolactone (ALDACTONE) 25 MG tablet Take 1 tablet (25 mg total) by mouth daily. 09/12/18 12/11/18 Yes Kathlyn Sacramento  A, MD   Allergies  Allergen Reactions  . Amoxicillin   . Augmentin [Amoxicillin-Pot Clavulanate] Hives  . Ciprofloxacin Itching    Patient states she starts itching from inside or internal.  . Clavulanic Acid      Review of Systems:  Gen:  Denies  fever, sweats, chills weight loss  HEENT: Denies blurred vision, double vision, ear pain, eye  pain, hearing loss, nose bleeds, sore throat Cardiac:  No dizziness, chest pain or heaviness, chest tightness,edema, No JVD Resp:   No cough, -sputum production, +shortness of breath,+wheezing, -hemoptysis,  Gi: Denies swallowing difficulty, stomach pain, nausea or vomiting, diarrhea, constipation, bowel incontinence Gu:  Denies bladder incontinence, burning urine Ext:   Denies Joint pain, stiffness or swelling Skin: Denies  skin rash, easy bruising or bleeding or hives Endoc:  Denies polyuria, polydipsia , polyphagia or weight change Psych:   Denies depression, insomnia or hallucinations  Other:  All other systems negative   BP 130/86 (BP Location: Left Arm, Patient Position: Sitting, Cuff Size: Large)   Pulse 87   Temp (!) 97 F (36.1 C) (Temporal)   Ht 5\' 3"  (1.6 m)   Wt 262 lb 12.8 oz (119.2 kg)   SpO2 99%   BMI 46.55 kg/m   Physical Examination:   General Appearance: No distress  Neuro:without focal findings,  speech normal,  HEENT: PERRLA, EOM intact.   Pulmonary: normal breath sounds, No wheezing.  CardiovascularNormal S1,S2.  No m/r/g.   Abdomen: Benign, Soft, non-tender. Renal:  No costovertebral tenderness  GU:  Not performed at this time. Endoc: No evident thyromegaly Skin:   warm, no rashes, no ecchymosis  +acanthosis nigricans Extremities: normal, no cyanosis, clubbing. PSYCHIATRIC: Mood, affect within normal limits.   ALL OTHER ROS ARE NEGATIVE     ASSESSMENT / PLAN:   Patient with sleep apnea however is noncompliant with CPAP Patient is to reestablish with DME company and supply company to assess for supplies and to reestablish CPAP therapy  Patient is at high risk for arrhythmias Patient is at high risk for progressive heart failure Patient is willing to restart her CPAP therapy Follow-up cardiology as scheduled   Reactive airways disease with intermittent wheezing Continue inhalers as prescribed Patient will need pulmonary function testing to  assess lung function to assess for obstructive lung disease   Smoking cessation strongly advised   Obesity -recommend significant weight loss -recommend changing diet  Deconditioned state -Recommend increased daily activity and exercise      A. fib with hyper thyroidism Continue therapy follow-up endocrinology Patient with acanthosis nigricans will need assessment for diabetes   Follow-up cardiology as scheduled     COVID-19 EDUCATION: The signs and symptoms of COVID-19 were discussed with the patient and how to seek care for testing.  The importance of social distancing was discussed today. Hand Washing Techniques and avoid touching face was advised.  MEDICATION ADJUSTMENTS/LABS AND TESTS ORDERED: Reestablish DME company for OSA supplies and therapy Please stop smoking Follow-up with PCP or endocrinology to assess for diabetes Obtain pulmonary function testing to assess for underlying obstructive lung disease Recommend weight loss ALBUTEROL AS NEEDED  CURRENT MEDICATIONS REVIEWED AT LENGTH WITH PATIENT TODAY  Follow-up 6 months   Total time spent 35 minutes  Pattye Meda Patricia Pesa, M.D.  [facility]  Medical Director [place] Director [facility] Cardio-Pulmonary Department

## 2020-09-09 ENCOUNTER — Ambulatory Visit: Payer: BC Managed Care – PPO

## 2020-10-17 ENCOUNTER — Other Ambulatory Visit: Payer: Self-pay | Admitting: Internal Medicine

## 2020-10-18 ENCOUNTER — Other Ambulatory Visit: Payer: Self-pay | Admitting: *Deleted

## 2020-10-18 MED ORDER — TRIAMTERENE-HCTZ 37.5-25 MG PO CAPS: 1.0000 | ORAL_CAPSULE | Freq: Every day | ORAL | 0 refills | Status: DC

## 2020-10-20 ENCOUNTER — Other Ambulatory Visit: Payer: Self-pay | Admitting: Cardiovascular Disease

## 2020-10-26 ENCOUNTER — Other Ambulatory Visit: Payer: Self-pay | Admitting: *Deleted

## 2020-10-26 MED ORDER — CARVEDILOL 12.5 MG PO TABS: 12.5000 mg | ORAL_TABLET | Freq: Two times a day (BID) | ORAL | 0 refills | Status: DC

## 2020-12-14 ENCOUNTER — Ambulatory Visit: Payer: BC Managed Care – PPO | Admitting: Obstetrics and Gynecology

## 2020-12-29 ENCOUNTER — Ambulatory Visit: Payer: BC Managed Care – PPO | Admitting: Internal Medicine

## 2020-12-29 NOTE — Progress Notes (Deleted)
Name: Bianca Acosta  MRN/ DOB: [id number], 1975-10-07    Age/ Sex: 45 y.o., female     PCP: Bianca Regal, NP   Reason for Endocrinology Evaluation: Hyperthyroidism     Initial Endocrinology Clinic Visit: 10/29/2019    PATIENT IDENTIFIER: Bianca Acosta is a 45 y.o., female with a past medical history of HTN, brain aneurysm, paroxysmal SVT and OSA. She has followed with [facility] since 10/29/2019 for consultative assistance with management of her hyperthyroidism.   HISTORICAL SUMMARY:   Patient has been noted with hyperthyroidism in 09/2019 during evaluation of resistant hypertension.  Her TSH was 0.270 uIU/mL , with an elevated free T4 at 1.25 ng/dL patient was also noted to have an incidental left thyroid nodule on CT scan which was ordered for evaluation of shortness of breath.   Methimazole was started 09/2019   TRAB was undetectable    A thyroid ultrasound 11/09/2019 showed a left inferior 4.4 cm nodule meeting FNA criteria which showed benign follicular nodule (Bethesda category II) on 11/26/2019. The pt opted to hold off on uptake and scan at the time.     No history of amiodarone use.  No FH of thyroid disease    SUBJECTIVE:    Today (12/29/2020):  Bianca Acosta is here for a follow up on toxic thyroid nodule.    Weight has been stable    Denies abdominal pain or vomiting Denies fever  Has noted local neck enlargement, and attributes this to nodules   Has hx of vitamin D insufficiency , has not taking any vitamin D      HISTORY:  Past Medical History:  Past Medical History:  Diagnosis Date  . Brain aneurysm 06/29/2015   a. s/p craniotomy.  . Chest pain    a. 09/2019 Cardiac CT: Ca2+ score = Zero.  . Chicken pox   . Headache   . History of echocardiogram    a. 12/2018 Echo: EF 60-65%. DD. Mild LAE; b. 11/2019 Echo: EF 60-65%, no rwma, nl PASP.   Marland Kitchen Hypertension   . PSVT (paroxysmal supraventricular tachycardia) ([place])     a. 08/2019 Zio: Avg HR 84 (NSR). 4 beats WCT (likely SVT w/ aberrancy). 4 SVT episodes, longes 9.9 secs. Rare PACs/PVCs (<1%). Some triggered events = SVT and/or PVCs.  . Sleep apnea 10/2018   a. Does not tolerate CPAP.  Marland Kitchen Thyroid nodule greater than or equal to 1.5 cm in diameter incidentally noted on imaging study    a. 09/2019 CTA Chest: Ill-defined 2.5cm L thyroid nodule - rec f/u U/S; b. 10/2019 Thyroid U/S: 4.4cm L-sided thyroid nodule. FNA rec; c. 11/2019 FNA benign.  . Tobacco abuse    Past Surgical History:  Past Surgical History:  Procedure Laterality Date  . CRANIOTOMY Right 06/29/2015   Procedure: CRANIOTOMY INTRACRANIAL ANEURYSM CLIPPING;  Surgeon: Kevan Ny Ditty, MD;  Location: [facility];  Service: Neurosurgery;  Laterality: Right;  . IR GENERIC HISTORICAL  05/25/2016   IR ANGIO INTRA EXTRACRAN SEL INTERNAL CAROTID BILAT MOD SED 05/25/2016 Consuella Lose, MD [facility]  . IR GENERIC HISTORICAL  06/29/2015   IR ANGIO INTRA EXTRACRAN SEL INTERNAL CAROTID BILAT MOD SED 06/29/2015 Consuella Lose, MD [facility]  . IR GENERIC HISTORICAL  06/29/2015   IR ANGIO VERTEBRAL SEL VERTEBRAL UNI L MOD SED 06/29/2015 Consuella Lose, MD [facility]  . IR GENERIC HISTORICAL  06/29/2015   IR 3D INDEPENDENT WKST 06/29/2015 Consuella Lose, MD [facility]  . UTERINE  FIBROID SURGERY  2020    Social History:  reports that she has been smoking. She started smoking about 25 years ago. She has a 20.00 pack-year smoking history. She has never used smokeless tobacco. She reports current alcohol use. She reports that she does not use drugs. Family History:  Family History  Problem Relation Age of Onset  . Hypertension Mother   . Hypertension Father   . Hypertension Maternal Aunt   . Hypertension Maternal Uncle   . Hypertension Paternal Aunt   . Hypertension Paternal Uncle   . Hypertension Maternal Grandmother   . Hypertension Maternal Grandfather   . Hypertension  Paternal Grandmother   . Hypertension Paternal Grandfather      HOME MEDICATIONS: Allergies as of 12/29/2020      Reactions   Amoxicillin    Augmentin [amoxicillin-pot Clavulanate] Hives   Ciprofloxacin Itching   Patient states she starts itching from inside or internal.   Clavulanic Acid       Medication List       Accurate as of Dec 29, 2020 12:44 PM. If you have any questions, ask your nurse or doctor.        albuterol 108 (90 Base) MCG/ACT inhaler Commonly known as: VENTOLIN HFA Inhale 2 puffs into the lungs every 6 (six) hours as needed for wheezing or shortness of breath.   carvedilol 12.5 MG tablet Commonly known as: COREG Take 1 tablet (12.5 mg total) by mouth 2 (two) times daily.   cetirizine 10 MG tablet Commonly known as: ZYRTEC TAKE 1 TABLET BY MOUTH EVERY DAY   cholecalciferol 25 MCG (1000 UNIT) tablet Commonly known as: VITAMIN D3 Take 1 tablet (1,000 Units total) by mouth daily.   Fortify Probiotic Womens Ex St Cpdr Take 1 capsule by mouth daily.   methimazole 5 MG tablet Commonly known as: TAPAZOLE Take 0.5 tablets (2.5 mg total) by mouth daily.   predniSONE 20 MG tablet Commonly known as: DELTASONE Take 1 tablet (20 mg total) by mouth daily with breakfast. 10 days   spironolactone 25 MG tablet Commonly known as: ALDACTONE TAKE 1 TABLET BY MOUTH EVERY DAY   triamterene-hydrochlorothiazide 37.5-25 MG capsule Commonly known as: DYAZIDE Take 1 each (1 capsule total) by mouth daily.         OBJECTIVE:   PHYSICAL EXAM: VS: There were no vitals taken for this visit.   EXAM: General: Pt appears well and is in NAD  Neck: General: Supple without adenopathy. Thyroid: Left thyroid nodule appreciated   Lungs: Clear with good BS bilat with no rales, rhonchi, or wheezes  Heart: Auscultation: RRR.  Abdomen: Normoactive bowel sounds, soft, nontender, without masses or organomegaly palpable  Extremities:  BL LE: No pretibial edema normal ROM  and strength.  Mental Status: Judgment, insight: Intact Orientation: Oriented to time, place, and person Mood and affect: No depression, anxiety, or agitation     DATA REVIEWED:  Results for [name], [name] (MRN [id number]) as of 08/30/2020 06:56  Ref. Range 08/26/2020 11:27  VITD Latest Ref Range: 30.00 - 100.00 ng/mL 22.29 (L)  TSH Latest Ref Range: 0.35 - 4.50 uIU/mL 3.42  T4,Free(Direct) Latest Ref Range: 0.60 - 1.60 ng/dL 0.83    Thyroid Ultrasound 11/09/2019    Nodule # 1:  Location: Left; Inferior  Maximum size: 4.4 cm; Other 2 dimensions: 3.4 x 2.5 cm  Composition: solid/almost completely solid (2)  Echogenicity: isoechoic (1)  Shape: not taller-than-wide (0)  Margins: smooth (0)  Echogenic foci: none (0)  ACR TI-RADS total points: 3.  ACR TI-RADS risk category: TR3 (3 points).  ACR TI-RADS recommendations:  Given size (>/= 2.5 cm) and appearance, fine needle aspiration of this mildly suspicious nodule should be considered based on TI-RADS criteria.  _________________________________________________________  There are no concerning lymph nodes identified on this study.  IMPRESSION: 1. Mildly heterogeneous, enlarged thyroid gland as detailed above. 2. Dominant 4.4 cm left-sided thyroid nodule. Fine-needle aspiration is recommended for this thyroid nodule.   ASSESSMENT / PLAN / RECOMMENDATIONS:   1. Hyperthyroidism   - Most likely cause is toxic left thyroid nodule but Graves' disease is another differential diagnosis.  - TFT results reviewed and will reduce methimazole as below    Medications   Decrease Methimazole 5 mg, half a tablet daily      2. Left thyroid nodule    - S/P benign FNA in 11/2019 - She has not had a repeat ultrasound      3. Vitamin D insufficiency:   - Will start OTC Vitamin D3 1000 iu daily    F/U in 4 months    Signed electronically by: Mack Guise, MD  [facility]  Endocrinology  [address] [place], [place] [address] Phone: [phone] FAX: [phone]      CC: Bianca Regal, NP No address on file Phone: None  Fax: None   Return to Endocrinology clinic as below: Future Appointments  Date Time Provider [place]  12/29/2020  1:00 PM Axelle Szwed, Melanie Crazier, MD [facility] None  12/30/2020  3:30 PM Malachy Mood, MD [facility] None  01/11/2021  4:30 PM [facility]  01/17/2021  4:00 PM Wellington Hampshire, MD [facility]

## 2020-12-30 ENCOUNTER — Other Ambulatory Visit: Payer: Self-pay

## 2020-12-30 ENCOUNTER — Encounter: Payer: Self-pay | Admitting: Obstetrics and Gynecology

## 2020-12-30 ENCOUNTER — Ambulatory Visit (INDEPENDENT_AMBULATORY_CARE_PROVIDER_SITE_OTHER): Payer: BC Managed Care – PPO | Admitting: Obstetrics and Gynecology

## 2020-12-30 ENCOUNTER — Other Ambulatory Visit (HOSPITAL_COMMUNITY)
Admission: RE | Admit: 2020-12-30 | Discharge: 2020-12-30 | Disposition: A | Discharge status: Home / Self Care | Payer: BC Managed Care – PPO | Source: Ambulatory Visit | Attending: Obstetrics and Gynecology | Admitting: Obstetrics and Gynecology

## 2020-12-30 VITALS — BP 128/74 | HR 90 | Ht 64.0 in | Wt 262.0 lb

## 2020-12-30 DIAGNOSIS — Z1239 Encounter for other screening for malignant neoplasm of breast: Secondary | ICD-10-CM

## 2020-12-30 DIAGNOSIS — Z113 Encounter for screening for infections with a predominantly sexual mode of transmission: Secondary | ICD-10-CM

## 2020-12-30 DIAGNOSIS — Z01419 Encounter for gynecological examination (general) (routine) without abnormal findings: Secondary | ICD-10-CM | POA: Diagnosis not present

## 2020-12-30 DIAGNOSIS — Z124 Encounter for screening for malignant neoplasm of cervix: Secondary | ICD-10-CM | POA: Diagnosis not present

## 2020-12-30 NOTE — Progress Notes (Signed)
Gynecology Annual Exam   PCP: Marval Regal, NP  Chief Complaint: No chief complaint on file.   History of Present Illness: Patient is a 45 y.o. I4P3295 presents for annual exam. The patient has no complaints today.   LMP: No LMP recorded. Average Interval: regular monthly Duration of flow: 5-6 days Heavy Menses: yes some month heavier other normal flow Clots: no Intermenstrual Bleeding: no Postcoital Bleeding: no Dysmenorrhea: no  The patient is sexually active. She currently uses none for contraception. She denies dyspareunia.  The patient does perform self breast exams.  There is no notable family history of breast or ovarian cancer in her family.  The patient wears seatbelts: yes.   The patient has regular exercise: not asked.    The patient denies current symptoms of depression.    Review of Systems: Review of Systems  Constitutional: Negative for chills and fever.  HENT: Negative for congestion.   Respiratory: Negative for cough and shortness of breath.   Cardiovascular: Negative for chest pain and palpitations.  Gastrointestinal: Negative for abdominal pain, constipation, diarrhea, heartburn, nausea and vomiting.  Genitourinary: Negative for dysuria, frequency and urgency.  Skin: Negative for itching and rash.  Neurological: Negative for dizziness and headaches.  Endo/Heme/Allergies: Negative for polydipsia.  Psychiatric/Behavioral: Negative for depression.    Past Medical History:  Patient Active Problem List   Diagnosis Date Noted  . Vitamin D insufficiency 08/26/2020  . Cigarette nicotine dependence without complication 18/84/1660  . Thyroid nodule 10/29/2019  . Hyperthyroidism 10/29/2019    On methimazole 10 mg and thyroid nodule with Bx planned. Followed by ENDOCRINOLOGY.   Marland Kitchen URI (upper respiratory infection) 07/19/2019  . OSA (obstructive sleep apnea) 12/25/2018    She has been advised to use CPAP. Her goal is to lose wt and not need it anymore.     . Intramural and subserous leiomyoma of uterus 10/25/2018  . Hives 07/23/2016  . Right knee pain 05/18/2016  . Morbid obesity with BMI of 40.0-44.9, adult ([place]) 04/23/2016    Healthy diet and weight loss in process.   Marland Kitchen HTN (hypertension) 04/23/2016  . Encounter for preventative adult health care exam with abnormal findings 04/23/2016  . 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 CRANIOTOMY Right 06/29/2015   Procedure: CRANIOTOMY INTRACRANIAL ANEURYSM CLIPPING;  Surgeon: Kevan Ny Ditty, MD;  Location: [facility];  Service: Neurosurgery;  Laterality: Right;  . IR GENERIC HISTORICAL  05/25/2016   IR ANGIO INTRA EXTRACRAN SEL INTERNAL CAROTID BILAT MOD SED 05/25/2016 Consuella Lose, MD [facility]  . IR GENERIC HISTORICAL  06/29/2015   IR ANGIO INTRA EXTRACRAN SEL INTERNAL CAROTID BILAT MOD SED 06/29/2015 Consuella Lose, MD [facility]  . IR GENERIC HISTORICAL  06/29/2015   IR ANGIO VERTEBRAL SEL VERTEBRAL UNI L MOD SED 06/29/2015 Consuella Lose, MD [facility]  . IR GENERIC HISTORICAL  06/29/2015   IR 3D INDEPENDENT WKST 06/29/2015 Consuella Lose, MD [facility]  . UTERINE FIBROID SURGERY  2020    Gynecologic History:  No LMP recorded. Contraception:  none Last Pap: Results were: 01/16/2018 ASCUS with NEGATIVE high risk HPV   Obstetric History: G4P0040  Family History:  Family History  Problem Relation Age of Onset  . Hypertension Mother   . Hypertension Father   . Hypertension Maternal Aunt   . Hypertension Maternal Uncle   .  Hypertension Paternal Aunt   . Hypertension Paternal Uncle   . Hypertension Maternal Grandmother   . Hypertension Maternal Grandfather   . Hypertension Paternal Grandmother   . Hypertension Paternal Grandfather     Social History:  Social History   Socioeconomic History  . Marital  status: Single    Spouse name: Not on file  . Number of children: Not on file  . Years of education: Not on file  . Highest education level: Not on file  Occupational History  . Not on file  Tobacco Use  . Smoking status: Current Every Day Smoker    Packs/day: 1.00    Years: 20.00    Pack years: 20.00    Start date: 68  . Smokeless tobacco: Never Used  . Tobacco comment: 0.5PPD 09/01/20  Vaping Use  . Vaping Use: Never used  Substance and Sexual Activity  . Alcohol use: Yes    Comment: ocassionally  . Drug use: No  . Sexual activity: Yes    Birth control/protection: None  Other Topics Concern  . Not on file  Social History Narrative  . Not on file   Social Determinants of Health   Financial Resource Strain: Not on file  Food Insecurity: Not on file  Transportation Needs: Not on file  Physical Activity: Not on file  Stress: Not on file  Social Connections: Not on file  Intimate Partner Violence: Not on file    Allergies:  Allergies  Allergen Reactions  . Amoxicillin   . Augmentin [Amoxicillin-Pot Clavulanate] Hives  . Ciprofloxacin Itching    Patient states she starts itching from inside or internal.  . Clavulanic Acid     Medications: Prior to Admission medications   Medication Sig Start Date End Date Taking? Authorizing Provider  albuterol (VENTOLIN HFA) 108 (90 Base) MCG/ACT inhaler Inhale 2 puffs into the lungs every 6 (six) hours as needed for wheezing or shortness of breath. 01/05/20   Marval Regal, NP  carvedilol (COREG) 12.5 MG tablet Take 1 tablet (12.5 mg total) by mouth 2 (two) times daily. 10/26/20   Marrianne Mood D, PA-C  cetirizine (ZYRTEC) 10 MG tablet TAKE 1 TABLET BY MOUTH EVERY DAY 02/22/20   Marval Regal, NP  cholecalciferol (VITAMIN D3) 25 MCG (1000 UT) tablet Take 1 tablet (1,000 Units total) by mouth daily. 09/26/18   Jodelle Green, FNP  methimazole (TAPAZOLE) 5 MG tablet Take 0.5 tablets (2.5 mg total) by mouth daily. 10/17/20    Shamleffer, Melanie Crazier, MD  predniSONE (DELTASONE) 20 MG tablet Take 1 tablet (20 mg total) by mouth daily with breakfast. 10 days 07/14/20   Flora Lipps, MD  Probiotic Product (FORTIFY PROBIOTIC WOMENS EX ST) CPDR Take 1 capsule by mouth daily. 09/17/18   Jodelle Green, FNP  spironolactone (ALDACTONE) 25 MG tablet TAKE 1 TABLET BY MOUTH EVERY DAY 10/20/20   Wellington Hampshire, MD  triamterene-hydrochlorothiazide (DYAZIDE) 37.5-25 MG capsule Take 1 each (1 capsule total) by mouth daily. 10/18/20   Wellington Hampshire, MD    Physical Exam Vitals: Blood pressure 128/74, pulse 90, height 5\' 4"  (1.626 m), weight 262 lb (118.8 kg), last menstrual period 12/16/2020.  General: NAD HEENT: normocephalic, anicteric Thyroid: no enlargement, no palpable nodules Pulmonary: No increased work of breathing, CTAB Cardiovascular: RRR, distal pulses 2+ Breast: Breast symmetrical, no tenderness, no palpable nodules or masses, no skin or nipple retraction present, no nipple discharge.  No axillary or supraclavicular lymphadenopathy. Abdomen: NABS, soft, non-tender, non-distended.  Umbilicus without lesions.  No hepatomegaly, splenomegaly or masses palpable. No evidence of hernia  Genitourinary:  External: Normal external female genitalia.  Normal urethral meatus, normal Bartholin's and Skene's glands.    Vagina: Normal vaginal mucosa, no evidence of prolapse.    Cervix: Grossly normal in appearance, no bleeding  Uterus: Enlarged 14 week size, mobile, irregularl contour.  No CMT  Adnexa: ovaries non-enlarged, no adnexal masses  Rectal: deferred  Lymphatic: no evidence of inguinal lymphadenopathy Extremities: no edema, erythema, or tenderness Neurologic: Grossly intact Psychiatric: mood appropriate, affect full  Female chaperone present for pelvic and breast  portions of the physical exam    Assessment: 45 y.o. G4P0040 routine annual exam  Plan: Problem List Items Addressed This Visit   None   Visit  Diagnoses    Encounter for gynecological examination without abnormal finding    -  Primary   Screening for malignant neoplasm of cervix       Relevant Orders   Cytology - PAP   Breast screening       Routine screening for STI (sexually transmitted infection)       Relevant Orders   Cytology - PAP      1) STI screening  was notoffered and therefore not obtained  2)  ASCCP guidelines and rational discussed.  Patient opts for every 3 years screening interval  3) Contraception - the patient is currently using  none.  She is attempting to conceive in the near future  - discussed age related fertility at present patient does not want any additional interventions for her fibroids  4) Routine healthcare maintenance including cholesterol, diabetes screening discussed managed by PCP  5) Return in about 1 year (around 12/30/2021) for annual .   Malachy Mood, MD, [place], [place] 12/30/2020, 2:38 PM

## 2020-12-30 NOTE — Patient Instructions (Signed)
[facility] [address] [place] [address]  [facility]  [address] [place] [address]  Phone: [phone]  

## 2021-01-03 LAB — CYTOLOGY - PAP
Chlamydia: NEGATIVE
Comment: NEGATIVE
Comment: NEGATIVE
Comment: NORMAL
Diagnosis: NEGATIVE
High risk HPV: NEGATIVE
Neisseria Gonorrhea: NEGATIVE

## 2021-01-11 ENCOUNTER — Other Ambulatory Visit: Payer: Self-pay

## 2021-01-11 ENCOUNTER — Ambulatory Visit
Admission: RE | Admit: 2021-01-11 | Discharge: 2021-01-11 | Disposition: A | Discharge status: Home / Self Care | Payer: BC Managed Care – PPO | Source: Ambulatory Visit | Attending: Internal Medicine | Admitting: Internal Medicine

## 2021-01-11 DIAGNOSIS — E041 Nontoxic single thyroid nodule: Secondary | ICD-10-CM | POA: Diagnosis not present

## 2021-01-17 ENCOUNTER — Ambulatory Visit: Payer: BC Managed Care – PPO | Admitting: Cardiovascular Disease

## 2021-01-26 ENCOUNTER — Other Ambulatory Visit: Payer: Self-pay | Admitting: Cardiovascular Disease

## 2021-01-27 ENCOUNTER — Other Ambulatory Visit: Payer: Self-pay | Admitting: *Deleted

## 2021-01-27 MED ORDER — CARVEDILOL 12.5 MG PO TABS: 12.5000 mg | ORAL_TABLET | Freq: Two times a day (BID) | ORAL | 0 refills | Status: DC

## 2021-02-08 ENCOUNTER — Ambulatory Visit: Payer: BC Managed Care – PPO | Admitting: Internal Medicine

## 2021-02-16 ENCOUNTER — Ambulatory Visit (INDEPENDENT_AMBULATORY_CARE_PROVIDER_SITE_OTHER): Payer: BC Managed Care – PPO | Admitting: Cardiovascular Disease

## 2021-02-16 ENCOUNTER — Other Ambulatory Visit: Payer: Self-pay

## 2021-02-16 ENCOUNTER — Encounter: Payer: Self-pay | Admitting: Cardiovascular Disease

## 2021-02-16 ENCOUNTER — Telehealth: Payer: Self-pay | Admitting: Internal Medicine

## 2021-02-16 ENCOUNTER — Other Ambulatory Visit
Admission: RE | Admit: 2021-02-16 | Discharge: 2021-02-16 | Disposition: A | Discharge status: Home / Self Care | Payer: BC Managed Care – PPO | Attending: Cardiovascular Disease | Admitting: Cardiovascular Disease

## 2021-02-16 VITALS — BP 122/70 | HR 82 | Ht 64.0 in | Wt 256.0 lb

## 2021-02-16 DIAGNOSIS — I10 Essential (primary) hypertension: Secondary | ICD-10-CM

## 2021-02-16 DIAGNOSIS — R0600 Dyspnea, unspecified: Secondary | ICD-10-CM

## 2021-02-16 DIAGNOSIS — Z6841 Body Mass Index (BMI) 40.0 and over, adult: Secondary | ICD-10-CM

## 2021-02-16 DIAGNOSIS — I471 Supraventricular tachycardia: Secondary | ICD-10-CM | POA: Diagnosis not present

## 2021-02-16 DIAGNOSIS — Z720 Tobacco use: Secondary | ICD-10-CM | POA: Diagnosis not present

## 2021-02-16 DIAGNOSIS — R0609 Other forms of dyspnea: Secondary | ICD-10-CM

## 2021-02-16 LAB — BASIC METABOLIC PANEL
Anion gap: 10 (ref 5–15)
BUN: 16 mg/dL (ref 6–20)
CO2: 27 mmol/L (ref 22–32)
Calcium: 9.4 mg/dL (ref 8.9–10.3)
Chloride: 104 mmol/L (ref 98–111)
Creatinine, Ser: 1.02 mg/dL — ABNORMAL HIGH (ref 0.44–1.00)
GFR, Estimated: >60 mL/min (ref >60)
Glucose, Bld: 94 mg/dL (ref 70–99)
Potassium: 3.3 mmol/L — ABNORMAL LOW (ref 3.5–5.1)
Sodium: 141 mmol/L (ref 135–145)

## 2021-02-16 NOTE — Patient Instructions (Addendum)
Medication Instructions:  Your physician recommends that you continue on your current medications as directed. Please refer to the Current Medication list given to you today.  *If you need a refill on your cardiac medications before your next appointment, please call your pharmacy*   Lab Work: Bmp  Please have your lab drawn at the medical mall. No appt needed. Lab hours are Mon-Fri 7:30am-6pm.  If you have labs (blood work) drawn today and your tests are completely normal, you will receive your results only by: [place] (if you have MyChart) OR A paper copy in the mail If you have any lab test that is abnormal or we need to change your treatment, we will call you to review the results.   Testing/Procedures: None ordered   Follow-Up: At [facility], you and your health needs are our priority.  As part of our continuing mission to provide you with exceptional heart care, we have created designated Provider Care Teams.  These Care Teams include your primary Cardiologist (physician) and Advanced Practice Providers (APPs -  Physician Assistants and Nurse Practitioners) who all work together to provide you with the care you need, when you need it.  We recommend signing up for the patient portal called "MyChart".  Sign up information is provided on this After Visit Summary.  MyChart is used to connect with patients for Virtual Visits (Telemedicine).  Patients are able to view lab/test results, encounter notes, upcoming appointments, etc.  Non-urgent messages can be sent to your provider as well.   To learn more about what you can do with MyChart, go to [email or web address].    Your next appointment:   Your physician wants you to follow-up in: 6 months You will receive a reminder letter in the mail two months in advance. If you don't receive a letter, please call our office to schedule the follow-up appointment.   The format for your next appointment:   In Person  Provider:    You may see Kathlyn Sacramento, MD or one of the following Advanced Practice Providers on your designated Care Team:   Murray Hodgkins, NP Christell Faith, PA-C Marrianne Mood, PA-C Cadence Kathlen Mody, [place]   Other Instructions N/A

## 2021-02-16 NOTE — Progress Notes (Signed)
Cardiology Office Note   Date:  02/16/2021   ID:  Bianca Acosta, DOB 1976-05-22, MRN [id number]  PCP:  Marval Regal, NP  Cardiologist:  Kathlyn Sacramento, MD    Chief Complaint  Patient presents with   Other    6 month follow up. Meds reviewed verbally with patient.       History of Present Illness: Bianca Acosta is a 45 y.o. female who is here today regarding resistant hypertension. She has a known history of HTN, tobacco use, obesity and brain aneurysum.  She reports prolonged history of difficult to control hypertension which started in early 62s.  She has strong family history of hypertension.   Echocardiogram in 2020 showed normal LV systolic function.  She had an episode of chest pain in January 2020.  Coronary calcium score was 0.  Event monitor showed 4 beats of wide-complex tachycardia felt to be SVT with aberrancy with 4 episodes of SVT the longest lasted 9.9 seconds.  She was subsequently diagnosed with hyperthyroidism and treated with methimazole.  Most likely she will require thyroidectomy. Chlorthalidone had to be stopped due to severe hypokalemia in spite of supplementation. The patient has been following with pulmonary for suspected COPD.  She continues to smoke.  She is trying to lose weight.  Past Medical History:  Diagnosis Date   Brain aneurysm 06/29/2015   a. s/p craniotomy.   Chest pain    a. 09/2019 Cardiac CT: Ca2+ score = Zero.   Chicken pox    Headache    History of echocardiogram    a. 12/2018 Echo: EF 60-65%. DD. Mild LAE; b. 11/2019 Echo: EF 60-65%, no rwma, nl PASP.    Hypertension    PSVT (paroxysmal supraventricular tachycardia) ([place])    a. 08/2019 Zio: Avg HR 84 (NSR). 4 beats WCT (likely SVT w/ aberrancy). 4 SVT episodes, longes 9.9 secs. Rare PACs/PVCs (<1%). Some triggered events = SVT and/or PVCs.   Sleep apnea 10/2018   a. Does not tolerate CPAP.   Thyroid nodule greater than or equal to 1.5 cm in diameter incidentally noted on  imaging study    a. 09/2019 CTA Chest: Ill-defined 2.5cm L thyroid nodule - rec f/u U/S; b. 10/2019 Thyroid U/S: 4.4cm L-sided thyroid nodule. FNA rec; c. 11/2019 FNA benign.   Tobacco abuse     Past Surgical History:  Procedure Laterality Date   CRANIOTOMY Right 06/29/2015   Procedure: CRANIOTOMY INTRACRANIAL ANEURYSM CLIPPING;  Surgeon: Kevan Ny Ditty, MD;  Location: [facility];  Service: Neurosurgery;  Laterality: Right;   IR GENERIC HISTORICAL  05/25/2016   IR ANGIO INTRA EXTRACRAN SEL INTERNAL CAROTID BILAT MOD SED 05/25/2016 Consuella Lose, MD [facility]   IR GENERIC HISTORICAL  06/29/2015   IR ANGIO INTRA EXTRACRAN SEL INTERNAL CAROTID BILAT MOD SED 06/29/2015 Consuella Lose, MD [facility]   IR GENERIC HISTORICAL  06/29/2015   IR ANGIO VERTEBRAL SEL VERTEBRAL UNI L MOD SED 06/29/2015 Consuella Lose, MD [facility]   IR GENERIC HISTORICAL  06/29/2015   IR 3D INDEPENDENT WKST 06/29/2015 Consuella Lose, MD [facility]   UTERINE FIBROID SURGERY  2020     Current Outpatient Medications  Medication Sig Dispense Refill   albuterol (VENTOLIN HFA) 108 (90 Base) MCG/ACT inhaler Inhale 2 puffs into the lungs every 6 (six) hours as needed for wheezing or shortness of breath. 18 g 3   carvedilol (COREG) 12.5 MG tablet Take 1 tablet (12.5 mg total) by mouth 2 (two)  times daily. 180 tablet 0   cetirizine (ZYRTEC) 10 MG tablet TAKE 1 TABLET BY MOUTH EVERY DAY 30 tablet 0   cholecalciferol (VITAMIN D3) 25 MCG (1000 UT) tablet Take 1 tablet (1,000 Units total) by mouth daily. 90 tablet 3   methimazole (TAPAZOLE) 5 MG tablet Take 0.5 tablets (2.5 mg total) by mouth daily. 45 tablet 1   predniSONE (DELTASONE) 20 MG tablet Take 1 tablet (20 mg total) by mouth daily with breakfast. 10 days 10 tablet 1   Probiotic Product (FORTIFY PROBIOTIC WOMENS EX ST) CPDR Take 1 capsule by mouth daily. 30 capsule 2   spironolactone (ALDACTONE) 25 MG tablet TAKE 1 TABLET BY MOUTH EVERY DAY  90 tablet 1   triamterene-hydrochlorothiazide (DYAZIDE) 37.5-25 MG capsule TAKE 1 EACH (1 CAPSULE TOTAL) BY MOUTH DAILY. 90 capsule 0   No current facility-administered medications for this visit.    Allergies:   Amoxicillin, Augmentin [amoxicillin-pot clavulanate], Ciprofloxacin, and Clavulanic acid    Social History:  The patient  reports that she has been smoking. She started smoking about 25 years ago. She has a 20.00 pack-year smoking history. She has never used smokeless tobacco. She reports current alcohol use. She reports that she does not use drugs.   Family History:  The patient's family history includes Hypertension in her father, maternal aunt, maternal grandfather, maternal grandmother, maternal uncle, mother, paternal aunt, paternal grandfather, paternal grandmother, and paternal uncle.    ROS:  Please see the history of present illness.   Otherwise, review of systems are positive for none.   All other systems are reviewed and negative.    PHYSICAL EXAM: VS:  BP 122/70 (BP Location: Left Arm, Patient Position: Sitting, Cuff Size: Large)   Pulse 82   Ht 5\' 4"  (1.626 m)   Wt 256 lb (116.1 kg)   SpO2 98%   BMI 43.94 kg/m  , BMI Body mass index is 43.94 kg/m. GEN: Well nourished, well developed, in no acute distress  HEENT: normal  Neck: no JVD, carotid bruits, or masses Cardiac: RRR; no rubs, or gallops,no edema .  1 out of 6 systolic murmur in the aortic area. Respiratory:  clear to auscultation bilaterally, normal work of breathing GI: soft, nontender, nondistended, + BS MS: no deformity or atrophy  Skin: warm and dry, no rash Neuro:  Strength and sensation are intact Psych: euthymic mood, full affect   EKG:  EKG is ordered today. The ekg ordered today demonstrates normal sinus rhythm with nonspecific T wave changes and mildly prolonged QT interval.  Recent Labs: 07/15/2020: BUN 18; Creatinine, Ser 1.05; Potassium 4.2; Sodium 137 08/26/2020: TSH 3.42    Lipid  Panel    Component Value Date/Time   CHOL 134 05/16/2018 0927   TRIG 81 05/16/2018 0927   HDL 49 (L) 05/16/2018 0927   CHOLHDL 2.7 05/16/2018 0927   VLDL 16.2 04/23/2016 1011   LDLCALC 69 05/16/2018 0927      Wt Readings from Last 3 Encounters:  02/16/21 256 lb (116.1 kg)  12/30/20 262 lb (118.8 kg)  09/01/20 262 lb 12.8 oz (119.2 kg)        ASSESSMENT AND PLAN:  1.  Refractory hypertension: T blood pressure is well controlled on carvedilol, spironolactone and triamterene hydrochlorothiazide.  Medications were refilled.  I requested basic metabolic profile.   2.  Hypothyroidism: She is controlled with methimazole and is considering lobectomy.  3.  Tobacco use: I again discussed with her the importance of smoking cessation but she  is concerned about weight gain associated with smoking cessation.   4.  Obesity: I discussed with her the importance of healthy lifestyle changes and regular exercise.  She already started the process.  5.  PSVT: Well-controlled with carvedilol.   Disposition:   FU with me in 6 months  Signed, Kathlyn Sacramento, MD  02/16/2021 4:07 PM    [facility]

## 2021-02-17 ENCOUNTER — Telehealth: Payer: Self-pay | Admitting: Cardiovascular Disease

## 2021-02-17 DIAGNOSIS — E876 Hypokalemia: Secondary | ICD-10-CM

## 2021-02-17 MED ORDER — POTASSIUM CHLORIDE CRYS ER 20 MEQ PO TBCR: 20.0000 meq | EXTENDED_RELEASE_TABLET | Freq: Every day | ORAL | 2 refills | Status: DC

## 2021-02-17 NOTE — Telephone Encounter (Signed)
She was going to do her PFT on 7/722 when she had another appt but PFT CXL that day. I have left a message for her to call and pick another day but all we have is today and Monday and she works Scientist, research (medical).  There might be one more appt toward the end of the month but not sure.

## 2021-02-17 NOTE — Telephone Encounter (Signed)
Lamar Laundry, RN  02/17/2021  2:29 PM EDT Back to Top     Prelim reviewed by RN await MD review and signature. Noted K+ of 3.3. Secure chat Dr. Fletcher Anon to advise.

## 2021-02-17 NOTE — Telephone Encounter (Signed)
Patient made aware of lab results and Dr. Fletcher Anon 's recommendation below. Rx has been sent to the patients pharmacy. Lab order placed for 2 week repeat bmp to be drawn at the medical mall. Patient verbalized understanding and voiced appreciation for the call.   Secure chat received from Dr. Fletcher Anon.  "Add K-dur 20 meq once daily."  "BMP in 2 weeks"

## 2021-03-02 NOTE — Telephone Encounter (Signed)
No it has not been scheduled they haven't given [place] the PFT appts for August yet

## 2021-03-02 NOTE — Telephone Encounter (Signed)
Rodena Piety, please advise on PFT scheduling for this pt. Thanks.

## 2021-03-06 NOTE — Telephone Encounter (Signed)
LVM for patient to call and sche her PFT

## 2021-03-08 NOTE — Telephone Encounter (Signed)
LVM again today for patient to call and sche her PFT

## 2021-03-10 NOTE — Telephone Encounter (Signed)
Patient's PFT has been scheduled for 03/13/21 @ 2:00pm Patient is aware of the appt

## 2021-03-13 ENCOUNTER — Telehealth: Payer: Self-pay

## 2021-03-13 ENCOUNTER — Ambulatory Visit: Payer: BC Managed Care – PPO

## 2021-03-13 NOTE — Telephone Encounter (Signed)
Received call from [place], RTT. Patient is scheduled for PFT today, however it does not appear that patient has had PFT.  PFT will need to be reschedule.   Lm for patient.  Rodena Piety, do you know if patient has recently tested positive for covid. Covid test was not scheduled.

## 2021-03-13 NOTE — Telephone Encounter (Signed)
PFT has been rescheduled on 03/23/21 @ 2:00pm per secure chat message and Covid test has been scheduled on 03/22/21. I have left a message for the patient to call about the appt change

## 2021-03-14 NOTE — Telephone Encounter (Signed)
Lm x2 for patient.  Will close encounter per office protocol.   

## 2021-03-22 ENCOUNTER — Other Ambulatory Visit: Payer: Self-pay

## 2021-03-22 ENCOUNTER — Other Ambulatory Visit
Admission: RE | Admit: 2021-03-22 | Discharge: 2021-03-22 | Disposition: A | Discharge status: Home / Self Care | Payer: BC Managed Care – PPO | Source: Ambulatory Visit | Attending: Internal Medicine | Admitting: Internal Medicine

## 2021-03-22 DIAGNOSIS — Z20822 Contact with and (suspected) exposure to covid-19: Secondary | ICD-10-CM | POA: Insufficient documentation

## 2021-03-22 DIAGNOSIS — Z01812 Encounter for preprocedural laboratory examination: Secondary | ICD-10-CM | POA: Diagnosis not present

## 2021-03-22 LAB — SARS CORONAVIRUS 2 (TAT 6-24 HRS): SARS Coronavirus 2: NEGATIVE

## 2021-03-23 ENCOUNTER — Ambulatory Visit: Payer: BC Managed Care – PPO | Attending: Internal Medicine

## 2021-03-23 DIAGNOSIS — G4733 Obstructive sleep apnea (adult) (pediatric): Secondary | ICD-10-CM | POA: Diagnosis not present

## 2021-03-23 DIAGNOSIS — J441 Chronic obstructive pulmonary disease with (acute) exacerbation: Secondary | ICD-10-CM

## 2021-03-23 MED ORDER — ALBUTEROL SULFATE (2.5 MG/3ML) 0.083% IN NEBU
2.5000 mg | INHALATION_SOLUTION | Freq: Once | RESPIRATORY_TRACT | Status: AC
  Administered 2021-03-23: 2.5 mg via RESPIRATORY_TRACT
  Filled 2021-03-23: qty 3

## 2021-03-27 ENCOUNTER — Telehealth: Payer: Self-pay | Admitting: Internal Medicine

## 2021-03-28 NOTE — Telephone Encounter (Signed)
Attempted to call pt but line went straight to VM. Unable to leave VM due to mailbox being full. Will try to call back later.  Results of the PFT can be seen under other orders tab of pt's chart as pt had the PFT performed at [facility].

## 2021-03-30 NOTE — Telephone Encounter (Signed)
ATC patient went straight to vm and unable to leave vm due to box being full, this is the 2nd attempt. Letter has been mailed to patient.   Interpretation Summary      Spirometry Data Is Acceptable and Reproducible   No obvious evidence of Obstructive Airways disease or Restrictive Lung disease   Consider outpatient follow up with Pulmonary if needed.   Clinical Correlation Advised

## 2021-04-11 ENCOUNTER — Telehealth: Payer: Self-pay | Admitting: Internal Medicine

## 2021-04-11 NOTE — Telephone Encounter (Signed)
Spirometry Data Is Acceptable and Reproducible   No obvious evidence of Obstructive Airways disease or Restrictive Lung disease   Consider outpatient follow up with Pulmonary if needed.   Clinical Correlation Advised  Patient is aware of results and voiced her understanding.  Ov scheduled for 05/17/2021 at 1:45. Nothing further is needed at this time.

## 2021-04-11 NOTE — Telephone Encounter (Addendum)
Please see 03/27/2021 phone note.  ATC patient--unable to leave vm due to mailbox being full.

## 2021-04-19 ENCOUNTER — Other Ambulatory Visit: Payer: Self-pay

## 2021-04-19 ENCOUNTER — Ambulatory Visit (INDEPENDENT_AMBULATORY_CARE_PROVIDER_SITE_OTHER): Payer: BC Managed Care – PPO | Admitting: Podiatry

## 2021-04-19 DIAGNOSIS — L608 Other nail disorders: Secondary | ICD-10-CM

## 2021-04-22 ENCOUNTER — Encounter: Payer: Self-pay | Admitting: Podiatry

## 2021-04-22 NOTE — Progress Notes (Signed)
  Subjective:  Patient ID: Bianca Acosta, female    DOB: 1975/12/27,  MRN: [id number]  Chief Complaint  Patient presents with   Ingrown Toenail     (NP) ingrown right foot  left foot ingrown , pincer nail deformity    45 y.o. female presents with the above complaint. History confirmed with patient.  She is a chronically incurvated and ingrowing nails for many years and is getting worse.  It hurts to trim.  She works on her feet  at [employer]  Objective:  Physical Exam: warm, good capillary refill, no trophic changes or ulcerative lesions, normal DP and PT pulses, normal sensory exam, and multiple pincer nail deformities the worst of which are the bilateral hallux. Assessment:   1. Pincer nail deformity      Plan:  Patient was evaluated and treated and all questions answered.  Discussed treatment options in detail I do not think that there is a reasonable way to do a partial permanent nail avulsion due to the size and shape of the nails.  We discussed doing a total nail avulsion today and allowing them to grow back, I discussed with her this may end up with the same result she currently has but she is willing to try this.  If that fails we will proceed with permanent total nail avulsion.  We will wait till mid October when she has a better combination in her work schedule to do the procedure.  She will follow-up with me then.  Return in 5 weeks (on 05/24/2021) for removal bilateral big toe nails.

## 2021-05-04 ENCOUNTER — Telehealth: Payer: Self-pay | Admitting: Adult Health

## 2021-05-04 NOTE — Telephone Encounter (Signed)
Patient calling in states she has been having increased wheezing due to allergies and has ran out of her albuterol.   Patient is needing a refill.   [place] Patient that was transferring to [facility].

## 2021-05-06 ENCOUNTER — Other Ambulatory Visit: Payer: Self-pay | Admitting: Family

## 2021-05-06 DIAGNOSIS — R059 Cough, unspecified: Secondary | ICD-10-CM

## 2021-05-06 DIAGNOSIS — R062 Wheezing: Secondary | ICD-10-CM

## 2021-05-06 MED ORDER — ALBUTEROL SULFATE HFA 108 (90 BASE) MCG/ACT IN AERS: 2.0000 | INHALATION_SPRAY | Freq: Four times a day (QID) | RESPIRATORY_TRACT | 3 refills | Status: DC | PRN

## 2021-05-08 NOTE — Telephone Encounter (Signed)
Placed call to pt to notify her of medication being sent to pharmacy.

## 2021-05-09 ENCOUNTER — Encounter: Payer: BC Managed Care – PPO | Admitting: Adult Health

## 2021-05-17 ENCOUNTER — Ambulatory Visit: Payer: BC Managed Care – PPO | Admitting: Internal Medicine

## 2021-05-18 ENCOUNTER — Other Ambulatory Visit: Payer: Self-pay | Admitting: Cardiovascular Disease

## 2021-05-19 ENCOUNTER — Ambulatory Visit (INDEPENDENT_AMBULATORY_CARE_PROVIDER_SITE_OTHER): Payer: BC Managed Care – PPO | Admitting: Internal Medicine

## 2021-05-19 ENCOUNTER — Other Ambulatory Visit: Payer: Self-pay

## 2021-05-19 VITALS — BP 124/80 | HR 100 | Ht 64.0 in | Wt 259.0 lb

## 2021-05-19 DIAGNOSIS — E559 Vitamin D deficiency, unspecified: Secondary | ICD-10-CM

## 2021-05-19 DIAGNOSIS — E0590 Thyrotoxicosis, unspecified without thyrotoxic crisis or storm: Secondary | ICD-10-CM

## 2021-05-19 NOTE — Patient Instructions (Signed)
   1) Take Methimazole 5 mg , half a tablet daily

## 2021-05-19 NOTE — Progress Notes (Signed)
Name: Bianca Acosta  MRN/ DOB: [id number], 03/10/1976    Age/ Sex: 45 y.o., female     PCP: Marval Regal, NP   Reason for Endocrinology Evaluation: Hyperthyroidism     Initial Endocrinology Clinic Visit: 10/29/2019    Bianca Acosta is a 45 y.o., female with a past medical history of HTN, brain aneurysm, paroxysmal SVT and OSA. She has followed with [facility] since 10/29/2019 for consultative assistance with management of her hyperthyroidism.   HISTORICAL SUMMARY:   Bianca has been noted with hyperthyroidism in 09/2019 during evaluation of resistant hypertension.  Her TSH was 0.270 uIU/mL , with an elevated free T4 at 1.25 ng/dL Bianca was also noted to have an incidental left thyroid nodule on CT scan which was ordered for evaluation of shortness of breath.     Methimazole was started 09/2019   TRAB was undetectable    A thyroid ultrasound 11/09/2019 showed a left inferior 4.4 cm nodule meeting FNA criteria which showed benign follicular nodule (Bethesda category II) on 11/26/2019. The pt opted to hold off on uptake and scan at the time.     No history of amiodarone use.   No FH of thyroid disease    SUBJECTIVE:    Today (05/19/2021):  Bianca Acosta is here for a follow up on toxic thyroid nodule.    Weight has been stable  Has COVID in 01/2021  Denies abdominal pain or vomiting Stable neck swelling   Methimazole 5 mg half a tablet daily  OTC Vitamin D3 2000 iu daily    HISTORY:  Past Medical History:  Past Medical History:  Diagnosis Date   Brain aneurysm 06/29/2015   a. s/p craniotomy.   Chest pain    a. 09/2019 Cardiac CT: Ca2+ score = Zero.   Chicken pox    Headache    History of echocardiogram    a. 12/2018 Echo: EF 60-65%. DD. Mild LAE; b. 11/2019 Echo: EF 60-65%, no rwma, nl PASP.    Hypertension    PSVT (paroxysmal supraventricular tachycardia) ([place])    a. 08/2019 Zio: Avg HR 84 (NSR). 4 beats WCT  (likely SVT w/ aberrancy). 4 SVT episodes, longes 9.9 secs. Rare PACs/PVCs (<1%). Some triggered events = SVT and/or PVCs.   Sleep apnea 10/2018   a. Does not tolerate CPAP.   Thyroid nodule greater than or equal to 1.5 cm in diameter incidentally noted on imaging study    a. 09/2019 CTA Chest: Ill-defined 2.5cm L thyroid nodule - rec f/u U/S; b. 10/2019 Thyroid U/S: 4.4cm L-sided thyroid nodule. FNA rec; c. 11/2019 FNA benign.   Tobacco abuse    Past Surgical History:  Past Surgical History:  Procedure Laterality Date   CRANIOTOMY Right 06/29/2015   Procedure: CRANIOTOMY INTRACRANIAL ANEURYSM CLIPPING;  Surgeon: Kevan Ny Ditty, MD;  Location: [facility];  Service: Neurosurgery;  Laterality: Right;   IR GENERIC HISTORICAL  05/25/2016   IR ANGIO INTRA EXTRACRAN SEL INTERNAL CAROTID BILAT MOD SED 05/25/2016 Consuella Lose, MD [facility]   IR GENERIC HISTORICAL  06/29/2015   IR ANGIO INTRA EXTRACRAN SEL INTERNAL CAROTID BILAT MOD SED 06/29/2015 Consuella Lose, MD [facility]   IR GENERIC HISTORICAL  06/29/2015   IR ANGIO VERTEBRAL SEL VERTEBRAL UNI L MOD SED 06/29/2015 Consuella Lose, MD [facility]   IR GENERIC HISTORICAL  06/29/2015   IR 3D INDEPENDENT WKST 06/29/2015 Consuella Lose, MD [facility]   UTERINE FIBROID SURGERY  2020  Social History:  reports that she has been smoking. She started smoking about 25 years ago. She has a 20.00 pack-year smoking history. She has never used smokeless tobacco. She reports current alcohol use. She reports that she does not use drugs. Family History:  Family History  Problem Relation Age of Onset   Hypertension Mother    Hypertension Father    Hypertension Maternal Aunt    Hypertension Maternal Uncle    Hypertension Paternal Aunt    Hypertension Paternal Uncle    Hypertension Maternal Grandmother    Hypertension Maternal Grandfather    Hypertension Paternal Grandmother    Hypertension Paternal Grandfather       HOME MEDICATIONS: Allergies as of 05/19/2021       Reactions   Amoxicillin    Augmentin [amoxicillin-pot Clavulanate] Hives   Ciprofloxacin Itching   Bianca states she starts itching from inside or internal.   Clavulanic Acid         Medication List        Accurate as of May 19, 2021  3:56 PM. If you have any questions, ask your nurse or doctor.          albuterol 108 (90 Base) MCG/ACT inhaler Commonly known as: VENTOLIN HFA Inhale 2 puffs into the lungs every 6 (six) hours as needed for wheezing or shortness of breath.   carvedilol 12.5 MG tablet Commonly known as: COREG Take 1 tablet (12.5 mg total) by mouth 2 (two) times daily.   cetirizine 10 MG tablet Commonly known as: ZYRTEC TAKE 1 TABLET BY MOUTH EVERY DAY   cholecalciferol 25 MCG (1000 UNIT) tablet Commonly known as: VITAMIN D3 Take 1 tablet (1,000 Units total) by mouth daily.   Fortify Probiotic Womens Ex St Cpdr Take 1 capsule by mouth daily.   Klor-Con M20 20 MEQ tablet Generic drug: potassium chloride SA TAKE 1 TABLET BY MOUTH EVERY DAY   methimazole 5 MG tablet Commonly known as: TAPAZOLE Take 0.5 tablets (2.5 mg total) by mouth daily.   predniSONE 20 MG tablet Commonly known as: DELTASONE Take 1 tablet (20 mg total) by mouth daily with breakfast. 10 days   spironolactone 25 MG tablet Commonly known as: ALDACTONE TAKE 1 TABLET BY MOUTH EVERY DAY   triamterene-hydrochlorothiazide 37.5-25 MG capsule Commonly known as: DYAZIDE TAKE 1 EACH (1 CAPSULE TOTAL) BY MOUTH DAILY.          OBJECTIVE:   PHYSICAL EXAM: VS: BP 124/80 (BP Location: Left Arm, Bianca Position: Sitting, Cuff Size: Large)   Pulse 100   Ht 5\' 4"  (1.626 m)   Wt 259 lb (117.5 kg)   SpO2 97%   BMI 44.46 kg/m    EXAM: General: Pt appears well and is in NAD  Neck: General: Supple without adenopathy. Thyroid: Left thyroid nodule appreciated   Lungs: Clear with good BS bilat with no rales, rhonchi, or  wheezes  Heart: Auscultation: RRR.  Abdomen: Normoactive bowel sounds, soft, nontender, without masses or organomegaly palpable  Extremities:  BL LE: No pretibial edema normal ROM and strength.  Mental Status: Judgment, insight: Intact Orientation: Oriented to time, place, and person Mood and affect: No depression, anxiety, or agitation     DATA REVIEWED:  Results for CRICKETT, ABBETT (MRN [id number]) as of 05/22/2021 07:40  Ref. Range 05/19/2021 16:12  Sodium Latest Ref Range: 135 - 146 mmol/L 140  Potassium Latest Ref Range: 3.5 - 5.3 mmol/L 3.9  Chloride Latest Ref Range: 98 - 110 mmol/L 103  CO2  Latest Ref Range: 20 - 32 mmol/L 29  Glucose Latest Ref Range: 65 - 99 mg/dL 101 (H)  BUN Latest Ref Range: 7 - 25 mg/dL 19  Creatinine Latest Ref Range: 0.50 - 0.99 mg/dL 1.32 (H)  Calcium Latest Ref Range: 8.6 - 10.2 mg/dL 10.1  BUN/Creatinine Ratio Latest Ref Range: 6 - 22 (calc) 14  AG Ratio Latest Ref Range: 1.0 - 2.5 (calc) 1.5  AST Latest Ref Range: 10 - 30 U/L 16  ALT Latest Ref Range: 6 - 29 U/L 17  Total Protein Latest Ref Range: 6.1 - 8.1 g/dL 7.2  Total Bilirubin Latest Ref Range: 0.2 - 1.2 mg/dL 0.5  Alkaline phosphatase (APISO) Latest Ref Range: 31 - 125 U/L 65  Vitamin D, 25-Hydroxy Latest Ref Range: 30 - 100 ng/mL 44  Globulin Latest Ref Range: 1.9 - 3.7 g/dL (calc) 2.9  WBC Latest Ref Range: 3.8 - 10.8 Thousand/uL 5.9  RBC Latest Ref Range: 3.80 - 5.10 Million/uL 4.99  Hemoglobin Latest Ref Range: 11.7 - 15.5 g/dL 13.7  HCT Latest Ref Range: 35.0 - 45.0 % 41.4  MCV Latest Ref Range: 80.0 - 100.0 fL 83.0  MCH Latest Ref Range: 27.0 - 33.0 pg 27.5  MCHC Latest Ref Range: 32.0 - 36.0 g/dL 33.1  RDW Latest Ref Range: 11.0 - 15.0 % 14.0  Platelets Latest Ref Range: 140 - 400 Thousand/uL 277  MPV Latest Ref Range: 7.5 - 12.5 fL 11.1  Neutrophils Latest Units: % 50.1  Monocytes Relative Latest Units: % 8.1  Eosinophil Latest Units: % 4.7  Basophil Latest Units: %  0.7  NEUT# Latest Ref Range: 1,500 - 7,800 cells/uL 2,956  Lymphocyte # Latest Ref Range: 850 - 3,900 cells/uL 2,148  Total Lymphocyte Latest Units: % 36.4  Eosinophils Absolute Latest Ref Range: 15 - 500 cells/uL 277  Basophils Absolute Latest Ref Range: 0 - 200 cells/uL 41  Absolute Monocytes Latest Ref Range: 200 - 950 cells/uL 478  TSH Latest Units: mIU/L 1.30  T4,Free(Direct) Latest Ref Range: 0.8 - 1.8 ng/dL 1.2  Albumin MSPROF Latest Ref Range: 3.6 - 5.1 g/dL 4.3     Thyroid Ultrasound 01/11/2021  The previously biopsied left thyroid dominant TR 3 nodule measures 5.1 x 3.7 x 2.6 cm, previously 4.4 x 3.4 x 2.5 cm. Correlate with prior pathology.   No new thyroid nodule.  No adenopathy.   IMPRESSION: Stable to slight increase in size of the dominant left thyroid TR 3 nodule, previously biopsied.   No new thyroid abnormality.   ASSESSMENT / PLAN / RECOMMENDATIONS:   Hyperthyroidism   - Most likely cause is toxic left thyroid nodule but Graves' disease is another differential diagnosis.  - TFT results reviewed and are stable  - No changes   Medications   Continue  Methimazole 5 mg, half a tablet daily      2. Left thyroid nodule    - S/P benign FNA in 11/2019 - No significant increase on 01/2021 ultrasound, will recheck on next visit      3. Vitamin D insufficiency:   - Resolved  - Continue OTC Vitamin D3 1000 iu daily    F/U in 4 months    Signed electronically by: Mack Guise, MD  [facility]  [address] [place], [place] [address] Phone: [phone] FAX: [phone]      CC: Marval Regal, NP [place] [address] Phone: [phone]  Fax: [phone]  Return to Endocrinology clinic as below: Future Appointments  Date Time Provider [place]  05/24/2021  1:15 PM Criselda Peaches, DPM [facility]  06/06/2021  2:45 PM Flora Lipps, MD [facility] None

## 2021-05-20 LAB — COMPREHENSIVE METABOLIC PANEL
AG Ratio: 1.5 (calc) (ref 1.0–2.5)
ALT: 17 U/L (ref 6–29)
AST: 16 U/L (ref 10–30)
Albumin: 4.3 g/dL (ref 3.6–5.1)
Alkaline phosphatase (APISO): 65 U/L (ref 31–125)
BUN/Creatinine Ratio: 14 (calc) (ref 6–22)
BUN: 19 mg/dL (ref 7–25)
CO2: 29 mmol/L (ref 20–32)
Calcium: 10.1 mg/dL (ref 8.6–10.2)
Chloride: 103 mmol/L (ref 98–110)
Creat: 1.32 mg/dL — ABNORMAL HIGH (ref 0.50–0.99)
Globulin: 2.9 g/dL (calc) (ref 1.9–3.7)
Glucose, Bld: 101 mg/dL — ABNORMAL HIGH (ref 65–99)
Potassium: 3.9 mmol/L (ref 3.5–5.3)
Sodium: 140 mmol/L (ref 135–146)
Total Bilirubin: 0.5 mg/dL (ref 0.2–1.2)
Total Protein: 7.2 g/dL (ref 6.1–8.1)

## 2021-05-20 LAB — CBC WITH DIFFERENTIAL/PLATELET
Absolute Monocytes: 478 cells/uL (ref 200–950)
Basophils Absolute: 41 cells/uL (ref 0–200)
Basophils Relative: 0.7 %
Eosinophils Absolute: 277 cells/uL (ref 15–500)
Eosinophils Relative: 4.7 %
HCT: 41.4 % (ref 35.0–45.0)
Hemoglobin: 13.7 g/dL (ref 11.7–15.5)
Lymphs Abs: 2148 cells/uL (ref 850–3900)
MCH: 27.5 pg (ref 27.0–33.0)
MCHC: 33.1 g/dL (ref 32.0–36.0)
MCV: 83 fL (ref 80.0–100.0)
MPV: 11.1 fL (ref 7.5–12.5)
Monocytes Relative: 8.1 %
Neutro Abs: 2956 cells/uL (ref 1500–7800)
Neutrophils Relative %: 50.1 %
Platelets: 277 10*3/uL (ref 140–400)
RBC: 4.99 10*6/uL (ref 3.80–5.10)
RDW: 14 % (ref 11.0–15.0)
Total Lymphocyte: 36.4 %
WBC: 5.9 10*3/uL (ref 3.8–10.8)

## 2021-05-20 LAB — TSH: TSH: 1.3 mIU/L

## 2021-05-20 LAB — T4, FREE: Free T4: 1.2 ng/dL (ref 0.8–1.8)

## 2021-05-20 LAB — VITAMIN D 25 HYDROXY (VIT D DEFICIENCY, FRACTURES): Vit D, 25-Hydroxy: 44 ng/mL (ref 30–100)

## 2021-05-22 MED ORDER — METHIMAZOLE 5 MG PO TABS: 2.5000 mg | ORAL_TABLET | Freq: Every day | ORAL | 1 refills | Status: DC

## 2021-05-24 ENCOUNTER — Ambulatory Visit (INDEPENDENT_AMBULATORY_CARE_PROVIDER_SITE_OTHER): Payer: BC Managed Care – PPO | Admitting: Podiatry

## 2021-05-24 ENCOUNTER — Other Ambulatory Visit: Payer: Self-pay

## 2021-05-24 DIAGNOSIS — L608 Other nail disorders: Secondary | ICD-10-CM

## 2021-05-24 MED ORDER — NEOMYCIN-POLYMYXIN-HC 1 % OT SOLN: OTIC | 0 refills | Status: DC

## 2021-05-24 NOTE — Patient Instructions (Signed)

## 2021-05-25 NOTE — Progress Notes (Signed)
  Subjective:  Patient ID: Bianca Acosta, female    DOB: 1975/12/18,  MRN: [id number]  Chief Complaint  Patient presents with   Nail Problem    removal bilateral big toe nails.    45 y.o. female presents with the above complaint. History confirmed with patient.  Here today for chronic ingrown nail removal  Objective:  Physical Exam: warm, good capillary refill, no trophic changes or ulcerative lesions, normal DP and PT pulses, normal sensory exam, and multiple pincer nail deformities the worst of which are the bilateral hallux. Assessment:   1. Pincer nail deformity      Plan:  Patient was evaluated and treated and all questions answered.    Ingrown Nail, bilaterally -Patient elects to proceed with minor surgery to remove ingrown toenail today. Consent reviewed and signed by patient. -Ingrown nail excised. See procedure note. -Educated on post-procedure care including soaking. Written instructions provided and reviewed. -Patient to follow up in 2 weeks for nail check.  Procedure: Excision of Ingrown Toenail Location: Bilateral 1st toe  nail . Anesthesia: Lidocaine 1% plain; 1.5 mL and Marcaine 0.5% plain; 1.5 mL, digital block. Skin Prep: Betadine. Dressing: Silvadene; telfa; dry, sterile, compression dressing. Technique: Following skin prep, the toe was exsanguinated and a tourniquet was secured at the base of the toe. The affected nail was freed, and excised. Chemical matrixectomy was then performed with phenol and irrigated out with alcohol. The tourniquet was then removed and sterile dressing applied. Disposition: Patient tolerated procedure well. Patient to return in 2 weeks for follow-up.    Return in about 2 weeks (around 06/07/2021) for nail re-check.

## 2021-05-28 ENCOUNTER — Other Ambulatory Visit: Payer: Self-pay | Admitting: Cardiovascular Disease

## 2021-06-01 ENCOUNTER — Other Ambulatory Visit: Payer: Self-pay

## 2021-06-01 MED ORDER — CARVEDILOL 12.5 MG PO TABS: 12.5000 mg | ORAL_TABLET | Freq: Two times a day (BID) | ORAL | 0 refills | Status: DC

## 2021-06-06 ENCOUNTER — Ambulatory Visit: Payer: BC Managed Care – PPO | Admitting: Internal Medicine

## 2021-06-07 ENCOUNTER — Ambulatory Visit: Payer: BC Managed Care – PPO | Admitting: Podiatry

## 2021-06-07 ENCOUNTER — Other Ambulatory Visit: Payer: Self-pay

## 2021-06-07 ENCOUNTER — Telehealth: Payer: Self-pay | Admitting: *Deleted

## 2021-06-07 NOTE — Telephone Encounter (Signed)
Patient came for her appointment.  Dr. Sherryle Lis was running behind.  She stated her toe was doing well.  She was instructed to continue her soaks and dressings as long as there was drainage, which she stated it's still draining just a little.  I advised her to give Bianca Acosta a call if she has any problems.

## 2021-06-22 ENCOUNTER — Telehealth: Payer: Self-pay | Admitting: Internal Medicine

## 2021-06-22 NOTE — Telephone Encounter (Signed)
Patient is returning phone call. Patient phone number is [phone]. May leave detailed message on voicemail.

## 2021-06-22 NOTE — Telephone Encounter (Signed)
Spoke to patient. Patient is requesting for recommendations.  She reports of nocturnal cough and wheezing. Cough is productive and clear in color and interferes with sleep.  Denied f/c/s, sob or additional sx.  She using albuterol Q6H. Not taking any OTC meds to help with sx.  Not vaccinated against flu or covid.  No recent covid test.    Dr. Mortimer Fries, please advise.

## 2021-06-22 NOTE — Telephone Encounter (Signed)
Atc X3--unable to leave vm due to mailbox being full.

## 2021-06-22 NOTE — Telephone Encounter (Signed)
ATC patient x2--unable to leave vm due to mailbox being full.

## 2021-06-22 NOTE — Telephone Encounter (Signed)
ATC patient- unable to leave vm due to mailbox being full.   

## 2021-06-22 NOTE — Telephone Encounter (Signed)
Patient is okay with waiting until 06/23/2021 for a response.

## 2021-06-22 NOTE — Telephone Encounter (Signed)
Patient wanted to make Dr. Mortimer Fries aware that she restarted her cpap last night.  She purchased a mask online that her coworker recommended.  She is aware that cpap does not have to reset.   Routing to Dr. Mortimer Fries, as an Juluis Rainier.

## 2021-06-23 MED ORDER — PREDNISONE 20 MG PO TABS: 20.0000 mg | ORAL_TABLET | Freq: Every day | ORAL | 0 refills | Status: DC

## 2021-06-23 MED ORDER — AZITHROMYCIN 250 MG PO TABS: 250.0000 mg | ORAL_TABLET | Freq: Every day | ORAL | 0 refills | Status: DC

## 2021-06-23 NOTE — Telephone Encounter (Signed)
Called and spoke to patient about Dr Zoila Shutter recommendation. Patient sis in agreement.  Sent in RX'S to patients preferred pharmacy. Nothing further needed.

## 2021-07-12 ENCOUNTER — Other Ambulatory Visit (HOSPITAL_BASED_OUTPATIENT_CLINIC_OR_DEPARTMENT_OTHER): Payer: Self-pay

## 2021-07-20 ENCOUNTER — Ambulatory Visit (INDEPENDENT_AMBULATORY_CARE_PROVIDER_SITE_OTHER): Payer: BC Managed Care – PPO | Admitting: Internal Medicine

## 2021-07-20 ENCOUNTER — Other Ambulatory Visit: Payer: Self-pay

## 2021-07-20 ENCOUNTER — Encounter: Payer: Self-pay | Admitting: Internal Medicine

## 2021-07-20 VITALS — BP 140/80 | HR 112 | Temp 97.7°F | Ht 64.0 in | Wt 264.0 lb

## 2021-07-20 DIAGNOSIS — J45901 Unspecified asthma with (acute) exacerbation: Secondary | ICD-10-CM | POA: Diagnosis not present

## 2021-07-20 DIAGNOSIS — F17210 Nicotine dependence, cigarettes, uncomplicated: Secondary | ICD-10-CM | POA: Diagnosis not present

## 2021-07-20 DIAGNOSIS — J4540 Moderate persistent asthma, uncomplicated: Secondary | ICD-10-CM | POA: Diagnosis not present

## 2021-07-20 DIAGNOSIS — G4733 Obstructive sleep apnea (adult) (pediatric): Secondary | ICD-10-CM | POA: Diagnosis not present

## 2021-07-20 DIAGNOSIS — Z6841 Body Mass Index (BMI) 40.0 and over, adult: Secondary | ICD-10-CM

## 2021-07-20 MED ORDER — PREDNISONE 20 MG PO TABS: 40.0000 mg | ORAL_TABLET | Freq: Every day | ORAL | 1 refills | Status: DC

## 2021-07-20 MED ORDER — AZITHROMYCIN 250 MG PO TABS: ORAL_TABLET | ORAL | 0 refills | Status: DC

## 2021-07-20 NOTE — Progress Notes (Signed)
Name: Bianca Acosta MRN: [id number] DOB: 03-15-1976     CONSULTATION DATE: 2.14.2020 REFERRING MD : Fletcher Anon    STUDIES:   08/2018  CXR independently reviewed by Me No acute findings No penumonia No edema No effusions  Sleep Study 09/2018 Mild AHI 10   CC Follow-up wheezing and asthma-like symptoms with reactive airways disease Follow-up OSA  Follow-up tobacco use   HISTORY OF PRESENT ILLNESS:  Regarding her OSA  Patient is very noncompliant with her CPAP has a diagnosis of sleep apnea with AHI of 10  Intolerant of CPAP  Patient is noncompliant  Patient has been explained the risks multiple times with high risk for cardiac and lung problems along with increased risk of stroke and death   Regarding wheezing Patient actively smoking 1 pack a day for last 25 years  Smoking Assessment and Cessation Counseling Patient continues to smoke 1 pack a day High risk for multiple medical problems  Patient is NOT willing to quit smoking  I have advised patient that we can assist and have options of Nicotine replacement therapy. I also advised patient on behavioral therapy and can provide oral medication therapy in conjunction with the other therapies Follow up next Office visit  for assessment of smoking cessation Smoking cessation counseling advised for 4 minutes   Patient currently with acute asthma exacerbation Increased work of breathing Bilateral wheezing Relative hypoxia 92% Patient refuses to go to the ER for further assessment And benefits of not being assessed immediately have been explained to the patient High risk of death and cardiac arrest      PAST MEDICAL HISTORY :   has a past medical history of Brain aneurysm (06/29/2015), Chest pain, Chicken pox, Headache, History of echocardiogram, Hypertension, PSVT (paroxysmal supraventricular tachycardia) ([place]), Sleep apnea (10/2018), Thyroid nodule greater than or equal to 1.5 cm in diameter incidentally  noted on imaging study, and Tobacco abuse.  has a past surgical history that includes Craniotomy (Right, 06/29/2015); ir generic historical (05/25/2016); ir generic historical (06/29/2015); ir generic historical (06/29/2015); ir generic historical (06/29/2015); and Uterine fibroid surgery (2020). Prior to Admission medications   Medication Sig Start Date End Date Taking? Authorizing Provider  albuterol (PROVENTIL HFA;VENTOLIN HFA) 108 (90 Base) MCG/ACT inhaler Inhale 2 puffs into the lungs every 6 (six) hours as needed for wheezing or shortness of breath. 08/29/18  Yes Guse, Jacquelynn Cree, FNP  bisacodyl (DULCOLAX) 5 MG EC tablet Take 1 tablet (5 mg total) by mouth daily as needed for moderate constipation. 09/26/18  Yes Guse, Jacquelynn Cree, FNP  chlorthalidone (HYGROTON) 25 MG tablet Take 1 tablet (25 mg total) by mouth daily. 09/12/18 12/11/18 Yes Wellington Hampshire, MD  cholecalciferol (VITAMIN D3) 25 MCG (1000 UT) tablet Take 1 tablet (1,000 Units total) by mouth daily. 09/26/18  Yes Guse, Jacquelynn Cree, FNP  nebivolol (BYSTOLIC) 10 MG tablet Take 1 tablet (10 mg total) by mouth daily. 09/26/18  Yes Guse, Jacquelynn Cree, FNP  omeprazole (PRILOSEC) 20 MG capsule Take 1 capsule (20 mg total) by mouth daily. 09/17/18  Yes Guse, Jacquelynn Cree, FNP  potassium chloride SA (K-DUR,KLOR-CON) 20 MEQ tablet Take 1 tablet (20 mEq total) by mouth daily. 09/23/18  Yes Wellington Hampshire, MD  Probiotic Product (FORTIFY PROBIOTIC WOMENS EX ST) CPDR Take 1 capsule by mouth daily. 09/17/18  Yes Guse, Jacquelynn Cree, FNP  spironolactone (ALDACTONE) 25 MG tablet Take 1 tablet (25 mg total) by mouth daily. 09/12/18 12/11/18 Yes Wellington Hampshire, MD   Allergies  Allergen Reactions   Amoxicillin    Augmentin [Amoxicillin-Pot Clavulanate] Hives   Ciprofloxacin Itching    Patient states she starts itching from inside or internal.   Clavulanic Acid      Review of Systems:  Gen:  Denies  fever, sweats, chills weight loss  HEENT: Denies blurred vision,  double vision, ear pain, eye pain, hearing loss, nose bleeds, sore throat Cardiac:  No dizziness, chest pain or heaviness, chest tightness,edema, No JVD Resp:   +cough, +sputum production, +shortness of breath,+wheezing, -hemoptysis,  Other:  All other systems negative  BP 140/80 (BP Location: Left Arm, Cuff Size: Large)   Pulse (!) 112   Temp 97.7 F (36.5 C) (Temporal)   Ht 5\' 4"  (1.626 m)   Wt 264 lb (119.7 kg)   SpO2 92%   BMI 45.32 kg/m    Physical Examination:   General Appearance: RESPIRATORY  distress  EYES PERRLA, EOM intact.   NECK Supple, No JVD Pulmonary: normal breath sounds, +WHEEZING CardiovascularNormal S1,S2.  No m/r/g.    ALL OTHER ROS ARE NEGATIVE      ASSESSMENT / PLAN:  45 year old African-American female morbid obesity signs and symptoms of moderate sleep apnea in the setting of intermittent reactive airways disease consistent with asthma related to ongoing smoking and tobacco abuse, COVID-19 infection pneumonia 6 months ago with active asthma exacerbation at this time   Asthma exacerbation PREDNISONE 40 mg daily for 10 days Z-Pak Recommend ER visit however patient refuses at this time Risks and benefits explained to the patient Will obtain respiratory viral panel  Patient with sleep apnea however is noncompliant with CPAP Patient is at high risk for arrhythmias Patient is at high risk for progressive heart failure PATIENT AT HIGH RISK FOR CARDIAC ARRYTHMIAS   Smoking cessation strongly advised  Obesity -recommend significant weight loss -recommend changing diet  Deconditioned state -Recommend increased daily activity and exercise   A. fib with hyper thyroidism Continue therapy follow-up endocrinology Patient with acanthosis nigricans will need assessment for diabetes   Follow-up cardiology as scheduled    MEDICATION ADJUSTMENTS/LABS AND TESTS ORDERED: Recommend ER visit Patient is at a high risk for developing worsening  respiratory failure Prednisone 20 mg daily for 10 days Z-Pak Recommend home viral testing Please stop smoking ALBUTEROL AS NEEDED   CURRENT MEDICATIONS REVIEWED AT LENGTH WITH PATIENT TODAY  Follow-up 1 year   Total time spent 32 minutes  Dangelo Guzzetta Patricia Pesa, M.D.  [facility]  Medical Director [place] Director [facility] Cardio-Pulmonary Department

## 2021-07-20 NOTE — Patient Instructions (Addendum)
Recommend ER visit Patient is at a high risk for developing worsening respiratory failure  Prednisone 20 mg daily for 10 days Z-Pak  Recommend home viral testing   Please stop smoking

## 2021-07-22 ENCOUNTER — Emergency Department: Payer: BC Managed Care – PPO

## 2021-07-22 ENCOUNTER — Encounter: Payer: Self-pay | Admitting: Emergency Medicine

## 2021-07-22 ENCOUNTER — Emergency Department
Admission: EM | Admit: 2021-07-22 | Discharge: 2021-07-22 | Disposition: A | Discharge status: Home / Self Care | Payer: BC Managed Care – PPO | Attending: Emergency Medicine | Admitting: Emergency Medicine

## 2021-07-22 ENCOUNTER — Other Ambulatory Visit: Payer: Self-pay

## 2021-07-22 DIAGNOSIS — F17210 Nicotine dependence, cigarettes, uncomplicated: Secondary | ICD-10-CM | POA: Diagnosis not present

## 2021-07-22 DIAGNOSIS — Z79899 Other long term (current) drug therapy: Secondary | ICD-10-CM | POA: Insufficient documentation

## 2021-07-22 DIAGNOSIS — R059 Cough, unspecified: Secondary | ICD-10-CM

## 2021-07-22 DIAGNOSIS — J101 Influenza due to other identified influenza virus with other respiratory manifestations: Secondary | ICD-10-CM | POA: Insufficient documentation

## 2021-07-22 DIAGNOSIS — I10 Essential (primary) hypertension: Secondary | ICD-10-CM | POA: Diagnosis not present

## 2021-07-22 DIAGNOSIS — Z20822 Contact with and (suspected) exposure to covid-19: Secondary | ICD-10-CM | POA: Diagnosis not present

## 2021-07-22 LAB — RESP PANEL BY RT-PCR (FLU A&B, COVID) ARPGX2
Influenza A by PCR: POSITIVE — AB
Influenza B by PCR: NEGATIVE
SARS Coronavirus 2 by RT PCR: NEGATIVE

## 2021-07-22 MED ORDER — IPRATROPIUM-ALBUTEROL 0.5-2.5 (3) MG/3ML IN SOLN
3.0000 mL | Freq: Once | RESPIRATORY_TRACT | Status: AC
  Administered 2021-07-22: 3 mL via RESPIRATORY_TRACT
  Filled 2021-07-22: qty 3

## 2021-07-22 MED ORDER — ALBUTEROL SULFATE (2.5 MG/3ML) 0.083% IN NEBU
2.5000 mg | INHALATION_SOLUTION | Freq: Once | RESPIRATORY_TRACT | Status: AC
  Administered 2021-07-22: 2.5 mg via RESPIRATORY_TRACT
  Filled 2021-07-22: qty 3

## 2021-07-22 NOTE — ED Notes (Signed)
Pt states she is usually not SOB but has been for about a week. Pt SOB with exertion.

## 2021-07-22 NOTE — Discharge Instructions (Signed)
Use your albuterol inhaler for wheezing and cough. Take Mucinex to help with cough and mucus. Finish mediations prescribed yesterday. Follow up with primary care if not improving over the weeek.

## 2021-07-22 NOTE — ED Provider Notes (Signed)
[facility] Emergency Department Provider Note  ____________________________________________  Time seen: Approximately 9:42 AM  I have reviewed the triage vital signs and the nursing notes.   HISTORY  Chief Complaint Cough and Nasal Congestion   HPI Bianca Acosta is a 45 y.o. female presents to the emergency department for treatment and evaluation of cough and congestion.  She was evaluated by pulmonology yesterday who started her on Z-Pak and prednisone.  She has had 1 dose of each.  Cough is persistent and keeping her awake at night.  No fever.  No nausea, vomiting, diarrhea.   Past Medical History:  Diagnosis Date   Brain aneurysm 06/29/2015   a. s/p craniotomy.   Chest pain    a. 09/2019 Cardiac CT: Ca2+ score = Zero.   Chicken pox    Headache    History of echocardiogram    a. 12/2018 Echo: EF 60-65%. DD. Mild LAE; b. 11/2019 Echo: EF 60-65%, no rwma, nl PASP.    Hypertension    PSVT (paroxysmal supraventricular tachycardia) ([place])    a. 08/2019 Zio: Avg HR 84 (NSR). 4 beats WCT (likely SVT w/ aberrancy). 4 SVT episodes, longes 9.9 secs. Rare PACs/PVCs (<1%). Some triggered events = SVT and/or PVCs.   Sleep apnea 10/2018   a. Does not tolerate CPAP.   Thyroid nodule greater than or equal to 1.5 cm in diameter incidentally noted on imaging study    a. 09/2019 CTA Chest: Ill-defined 2.5cm L thyroid nodule - rec f/u U/S; b. 10/2019 Thyroid U/S: 4.4cm L-sided thyroid nodule. FNA rec; c. 11/2019 FNA benign.   Tobacco abuse     Patient Active Problem List   Diagnosis Date Noted   Vitamin D insufficiency 08/26/2020   Cigarette nicotine dependence without complication 98/33/8250   Thyroid nodule 10/29/2019   Hyperthyroidism 10/29/2019   URI (upper respiratory infection) 07/19/2019   OSA (obstructive sleep apnea) 12/25/2018   Intramural and subserous leiomyoma of uterus 10/25/2018   Hives 07/23/2016   Right knee pain 05/18/2016   Morbid obesity with  BMI of 40.0-44.9, adult ([place]) 04/23/2016   HTN (hypertension) 04/23/2016   Encounter for preventative adult health care exam with abnormal findings 04/23/2016   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    Past Surgical History:  Procedure Laterality Date   CRANIOTOMY Right 06/29/2015   Procedure: CRANIOTOMY INTRACRANIAL ANEURYSM CLIPPING;  Surgeon: Kevan Ny Ditty, MD;  Location: [facility];  Service: Neurosurgery;  Laterality: Right;   IR GENERIC HISTORICAL  05/25/2016   IR ANGIO INTRA EXTRACRAN SEL INTERNAL CAROTID BILAT MOD SED 05/25/2016 Consuella Lose, MD [facility]   IR GENERIC HISTORICAL  06/29/2015   IR ANGIO INTRA EXTRACRAN SEL INTERNAL CAROTID BILAT MOD SED 06/29/2015 Consuella Lose, MD [facility]   IR GENERIC HISTORICAL  06/29/2015   IR ANGIO VERTEBRAL SEL VERTEBRAL UNI L MOD SED 06/29/2015 Consuella Lose, MD [facility]   IR GENERIC HISTORICAL  06/29/2015   IR 3D INDEPENDENT WKST 06/29/2015 Consuella Lose, MD [facility]   UTERINE FIBROID SURGERY  2020    Prior to Admission medications   Medication Sig Start Date End Date Taking? Authorizing Provider  albuterol (VENTOLIN HFA) 108 (90 Base) MCG/ACT inhaler Inhale 2 puffs into the lungs every 6 (six) hours as needed for wheezing or shortness of breath. 05/06/21   Kennyth Arnold, FNP  azithromycin (ZITHROMAX Z-PAK) 250 MG tablet Take 2 tablets on Day 1 and then 1  tablet daily till gone. 07/20/21   Flora Lipps, MD  carvedilol (COREG) 12.5 MG tablet Take 1 tablet (12.5 mg total) by mouth 2 (two) times daily. 06/01/21   Marrianne Mood D, PA-C  cetirizine (ZYRTEC) 10 MG tablet TAKE 1 TABLET BY MOUTH EVERY DAY 02/22/20   Marval Regal, NP  cholecalciferol (VITAMIN D3) 25 MCG (1000 UT) tablet Take 1 tablet (1,000 Units total) by mouth daily. 09/26/18   Jodelle Green, FNP  KLOR-CON M20 20 MEQ tablet TAKE 1 TABLET BY MOUTH EVERY DAY 05/18/21   Wellington Hampshire, MD  methimazole (TAPAZOLE) 5 MG tablet Take 0.5 tablets (2.5 mg total) by mouth daily. 05/22/21   Shamleffer, Melanie Crazier, MD  NEOMYCIN-POLYMYXIN-HYDROCORTISONE (CORTISPORIN) 1 % SOLN OTIC solution Apply to nail beds from procedure site twice daily after soaks 05/24/21   McDonald, Stephan Minister, DPM  predniSONE (DELTASONE) 20 MG tablet Take 2 tablets (40 mg total) by mouth daily with breakfast. 10 days 07/20/21   Flora Lipps, MD  Probiotic Product (FORTIFY PROBIOTIC WOMENS EX ST) CPDR Take 1 capsule by mouth daily. 09/17/18   Jodelle Green, FNP  spironolactone (ALDACTONE) 25 MG tablet TAKE 1 TABLET BY MOUTH EVERY DAY 05/29/21   Wellington Hampshire, MD  triamterene-hydrochlorothiazide (DYAZIDE) 37.5-25 MG capsule TAKE 1 EACH (1 CAPSULE TOTAL) BY MOUTH DAILY. 05/29/21   Wellington Hampshire, MD    Allergies Amoxicillin, Augmentin [amoxicillin-pot clavulanate], Ciprofloxacin, and Clavulanic acid  Family History  Problem Relation Age of Onset   Hypertension Mother    Hypertension Father    Hypertension Maternal Aunt    Hypertension Maternal Uncle    Hypertension Paternal Aunt    Hypertension Paternal Uncle    Hypertension Maternal Grandmother    Hypertension Maternal Grandfather    Hypertension Paternal Grandmother    Hypertension Paternal Grandfather     Social History Social History   Tobacco Use   Smoking status: Every Day    Packs/day: 1.00    Years: 20.00    Pack years: 20.00    Types: Cigarettes    Start date: 1997   Smokeless tobacco: Never  Vaping Use   Vaping Use: Never used  Substance Use Topics   Alcohol use: Yes    Comment: ocassionally   Drug use: No    Review of Systems Constitutional: Negative fever/chills.  Normal appetite. ENT: Negative for sore throat. Cardiovascular: Denies chest pain. Respiratory: Negative shortness of breath.  Positive for cough.  Positive wheezing.  Gastrointestinal: Negative for nausea, no vomiting.  No diarrhea.   Musculoskeletal: Negative for body aches Skin: Negative for rash. Neurological: Negative for headaches ____________________________________________   PHYSICAL EXAM:  VITAL SIGNS: ED Triage Vitals  Enc Vitals Group     BP 07/22/21 0910 (!) 156/88     Pulse Rate 07/22/21 0910 92     Resp 07/22/21 0910 20     Temp 07/22/21 0910 98.5 F (36.9 C)     Temp Source 07/22/21 0910 Oral     SpO2 07/22/21 0910 92 %     Weight 07/22/21 0903 264 lb (119.7 kg)     Height 07/22/21 0903 5\' 4"  (1.626 m)     Head Circumference --      Peak Flow --      Pain Score 07/22/21 0903 2     Pain Loc --      Pain Edu? --      Excl. in [place]? --     Constitutional: Alert and  oriented. Overall well appearing and in no acute distress. Eyes: Conjunctivae are normal. Ears: TM normal Nose: No sinus congestion noted; no rhinnorhea. Mouth/Throat: Mucous membranes are moist.  Oropharynx without erythema. Tonsils flat. Uvula midline. Neck: No stridor.  Lymphatic: No cervical lymphadenopathy. Cardiovascular: Normal rate, regular rhythm. Good peripheral circulation. Respiratory: Respirations are even and unlabored.  No retractions. Expiratory wheezing. Gastrointestinal: Soft and nontender.  Musculoskeletal: FROM x 4 extremities.  Neurologic:  Normal speech and language. Skin:  Skin is warm, dry and intact. No rash noted. Psychiatric: Mood and affect are normal. Speech and behavior are normal.  ____________________________________________   LABS (all labs ordered are listed, but only abnormal results are displayed)  Labs Reviewed  RESP PANEL BY RT-PCR (FLU A&B, COVID) ARPGX2 - Abnormal; Notable for the following components:      Result Value   Influenza A by PCR POSITIVE (*)    All other components within normal limits   ____________________________________________  EKG  Not indicated. ____________________________________________  RADIOLOGY  Chest x-ray without concern for  pneumonia. ____________________________________________   PROCEDURES  Procedure(s) performed: None  Critical Care performed: No ____________________________________________   INITIAL IMPRESSION / ASSESSMENT AND PLAN / ED COURSE  45 y.o. female presents to the emergency department for treatment and evaluation of viral symptoms as described in the HPI.  Chest x-ray is negative for's concern for pneumonia.  Respiratory panel does show a positive influenza A test.  On exam, she is wheezing.  DuoNeb ordered.   Reassessment lung sounds reveal increased air movement but still has wheezing especially in the right lower lobe.  Albuterol treatment ordered.  Improved.  She will continue taking the steroid and Z-Pak as prescribed by pulmonology.  She was encouraged to start using her albuterol inhaler for cough or wheezing.  She was also encouraged to use Mucinex.   She will be discharged home with instructions to follow-up with primary care if not improving over the week.  She was also encouraged to return to the emergency department for symptoms of change or worsen if she is unable to schedule an appointment.  Medications  ipratropium-albuterol (DUONEB) 0.5-2.5 (3) MG/3ML nebulizer solution 3 mL (3 mLs Nebulization Given 07/22/21 1012)  albuterol (PROVENTIL) (2.5 MG/3ML) 0.083% nebulizer solution 2.5 mg (2.5 mg Nebulization Given 07/22/21 1108)    ED Discharge Orders     None        Pertinent labs & imaging results that were available during my care of the patient were reviewed by me and considered in my medical decision making (see chart for details).    If controlled substance prescribed during this visit, 12 month history viewed on the [place] prior to issuing an initial prescription for Schedule II or III opiod. ____________________________________________   FINAL CLINICAL IMPRESSION(S) / ED DIAGNOSES  Final diagnoses:  Influenza A    Note:  This document was prepared  using Dragon voice recognition software and may include unintentional dictation errors.     Victorino Dike, FNP 07/22/21 1423    Delman Kitten, MD 07/22/21 [phone]

## 2021-07-22 NOTE — ED Triage Notes (Signed)
Pt reports productive cough and congestion and feels SOB. Pt states her pulmonologist put her on a z-pack and prednisone Thursday but the cough is keeping her awake at night and her cough is producing a lot of mucous

## 2021-08-17 ENCOUNTER — Telehealth: Payer: Self-pay | Admitting: Internal Medicine

## 2021-08-17 NOTE — Telephone Encounter (Signed)
Patient is aware of recommendations and voiced her understanding. Nothing further needed.   

## 2021-08-17 NOTE — Telephone Encounter (Signed)
ATC-unable to leave vm due to mailbox being full.  

## 2021-08-17 NOTE — Telephone Encounter (Signed)
She needs to be evaluated either urgent care or ED as she needs to be tested for both flu and COVID.  By definition she has failed outpatient therapy and needs to be evaluated in urgent care or ED.

## 2021-08-17 NOTE — Telephone Encounter (Signed)
Spoke to patient.  Patient reports of prod cough with clear sputum, chest tightness, sob with incline, mild wheezing and nasal congestion. She stated that cough is painful and causes mild chest discomfort. Denied f/c/s or additional sx. Using albuterol HFA Q6H and mucinex DME once daily.  Flu A 07/22/2021. No recent covid or flu test.  Not vaccinated against covid or flu.  Completed zpak and prednisone that was prescribed on 07/20/2021  Dr. Patsey Berthold, please advise. Dr. Mortimer Fries is unavailable.

## 2021-09-06 ENCOUNTER — Ambulatory Visit (INDEPENDENT_AMBULATORY_CARE_PROVIDER_SITE_OTHER): Payer: Self-pay | Admitting: Adult Health

## 2021-09-06 DIAGNOSIS — Z91199 Patient's noncompliance with other medical treatment and regimen due to unspecified reason: Secondary | ICD-10-CM

## 2021-09-06 NOTE — Progress Notes (Signed)
No show for transfer of care new patient visit.

## 2021-09-17 ENCOUNTER — Other Ambulatory Visit: Payer: Self-pay | Admitting: Cardiovascular Disease

## 2021-09-18 NOTE — Telephone Encounter (Signed)
Attempted to schedule.  LMOV to call office.  ° °

## 2021-09-18 NOTE — Telephone Encounter (Signed)
Please contact pt for future appointment. Pt OD for 6 month f/u. Pt needing refills.

## 2021-09-21 NOTE — Telephone Encounter (Signed)
Schduled

## 2021-10-10 NOTE — Progress Notes (Deleted)
?Cardiology Office Note:   ? ?Date:  10/10/2021  ? ?ID:  Bianca Acosta, DOB [birth year], MRN [id number] ? ?PCP:  [facility], MD  ?[facility] Cardiologist:  Kathlyn [place], MD  ?[place] Electrophysiologist:  None  ? ?Referring MD: No ref. provider found  ? ?Chief Complaint: Follow-up ? ?History of Present Illness:   ? ?Bianca Acosta is a 46 y.o. female with a hx of HTN, tobacco use, COPD followed by pulmonology, obesity, brain aneurysm who presents for follow-up.  ? ?H/o uncontrolled HTN. Echo in 2020 showed normal LVSF. She had an episode of chest pain in January 2020. Coronary calcium score was 1. Event monitor showed 4 beats WCT felt ot be SVT with aberrancy with 4 episodes of SVT the longest lasted 9.9 seconds. She was subsequently diagnosed with hyperthyroidism treated with methimazole. Chlorthalidone previously stopped due to severe hypokalemia.  ? ?Last seen 02/16/21 and was overall stable.  ? ?Today,  ? ?Past Medical History:  ?Diagnosis Date  ? Brain aneurysm 06/29/2015  ? a. s/p craniotomy.  ? Chest pain   ? a. 09/2019 Cardiac CT: Ca2+ score = Zero.  ? Chicken pox   ? Headache   ? History of echocardiogram   ? a. 12/2018 Echo: EF 60-65%. DD. Mild LAE; b. 11/2019 Echo: EF 60-65%, no rwma, nl PASP.   ? Hypertension   ? PSVT (paroxysmal supraventricular tachycardia) ([place])   ? a. 08/2019 Zio: Avg HR 84 (NSR). 4 beats WCT (likely SVT w/ aberrancy). 4 SVT episodes, longes 9.9 secs. Rare PACs/PVCs (<1%). Some triggered events = SVT and/or PVCs.  ? Sleep apnea 10/2018  ? a. Does not tolerate CPAP.  ? Thyroid nodule greater than or equal to 1.5 cm in diameter incidentally noted on imaging study   ? a. 09/2019 CTA Chest: Ill-defined 2.5cm L thyroid nodule - rec f/u U/S; b. 10/2019 Thyroid U/S: 4.4cm L-sided thyroid nodule. FNA rec; c. 11/2019 FNA benign.  ? Tobacco abuse   ? ? ?Past Surgical History:  ?Procedure Laterality Date  ? CRANIOTOMY Right 06/29/2015  ? Procedure: CRANIOTOMY INTRACRANIAL  ANEURYSM CLIPPING;  Surgeon: Kevan Ny Ditty, MD;  Location: [place] [facility];  Service: Neurosurgery;  Laterality: Right;  ? IR GENERIC HISTORICAL  05/25/2016  ? IR ANGIO INTRA EXTRACRAN SEL INTERNAL CAROTID BILAT MOD SED 05/25/2016 Consuella Lose, MD [facility]  ? IR GENERIC HISTORICAL  06/29/2015  ? IR ANGIO INTRA EXTRACRAN SEL INTERNAL CAROTID BILAT MOD SED 06/29/2015 Consuella Lose, MD [facility]  ? IR GENERIC HISTORICAL  06/29/2015  ? IR ANGIO VERTEBRAL SEL VERTEBRAL UNI L MOD SED 06/29/2015 Consuella Lose, MD [facility]  ? IR GENERIC HISTORICAL  06/29/2015  ? IR 3D INDEPENDENT WKST 06/29/2015 Consuella Lose, MD [facility]  ? UTERINE FIBROID SURGERY  2020  ? ? ?Current Medications: ?No outpatient medications have been marked as taking for the 10/11/21 encounter (Appointment) with Kathlen Mody, Annalisia Ingber H, PA-C.  ?  ? ?Allergies:   Amoxicillin, Augmentin [amoxicillin-pot clavulanate], Ciprofloxacin, and Clavulanic acid  ? ?Social History  ? ?Socioeconomic History  ? Marital status: Single  ?  Spouse name: Not on file  ? Number of children: Not on file  ? Years of education: Not on file  ? Highest education level: Not on file  ?Occupational History  ? Not on file  ?Tobacco Use  ? Smoking status: Every Day  ?  Packs/day: 1.00  ?  Years: 20.00  ?  Pack years: 20.00  ?  Types:  Cigarettes  ?  Start date: 92  ? Smokeless tobacco: Never  ?Vaping Use  ? Vaping Use: Never used  ?Substance and Sexual Activity  ? Alcohol use: Yes  ?  Comment: ocassionally  ? Drug use: No  ? Sexual activity: Yes  ?  Birth control/protection: None  ?Other Topics Concern  ? Not on file  ?Social History Narrative  ? Not on file  ? ?Social Determinants of Health  ? ?Financial Resource Strain: Not on file  ?Food Insecurity: Not on file  ?Transportation Needs: Not on file  ?Physical Activity: Not on file  ?Stress: Not on file  ?Social Connections: Not on file  ?  ? ?Family History: ?The patient's ***family history includes  Hypertension in her father, maternal aunt, maternal grandfather, maternal grandmother, maternal uncle, mother, paternal aunt, paternal grandfather, paternal grandmother, and paternal uncle. ? ?ROS:   ?Please see the history of present illness.    ?*** All other systems reviewed and are negative. ? ?EKGs/Labs/Other Studies Reviewed:   ? ?The following studies were reviewed today: ?*** ? ?EKG:  EKG is *** ordered today.  The ekg ordered today demonstrates *** ? ?Recent Labs: ?05/19/2021: ALT 17; BUN 19; Creat 1.32; Hemoglobin 13.7; Platelets 277; Potassium 3.9; Sodium 140; TSH 1.30  ?Recent Lipid Panel ?   ?Component Value Date/Time  ? CHOL 134 05/16/2018 0927  ? TRIG 81 05/16/2018 0927  ? HDL 49 (L) 05/16/2018 0927  ? CHOLHDL 2.7 05/16/2018 0927  ? VLDL 16.2 04/23/2016 1011  ? [place] 69 05/16/2018 0927  ? ? ? ?Risk Assessment/Calculations:   ?{Does this patient have ATRIAL FIBRILLATION?:[phone]} ? ? ?Physical Exam:   ? ?VS:  There were no vitals taken for this visit.   ? ?Wt Readings from Last 3 Encounters:  ?07/22/21 264 lb (119.7 kg)  ?07/20/21 264 lb (119.7 kg)  ?05/19/21 259 lb (117.5 kg)  ?  ? ?GEN: *** Well nourished, well developed in no acute distress ?HEENT: Normal ?NECK: No JVD; No carotid bruits ?LYMPHATICS: No lymphadenopathy ?CARDIAC: ***RRR, no murmurs, rubs, gallops ?RESPIRATORY:  Clear to auscultation without rales, wheezing or rhonchi  ?ABDOMEN: Soft, non-tender, non-distended ?MUSCULOSKELETAL:  No edema; No deformity  ?SKIN: Warm and dry ?NEUROLOGIC:  Alert and oriented x 3 ?PSYCHIATRIC:  Normal affect  ? ?ASSESSMENT:   ? ?No diagnosis found. ?PLAN:   ? ?In order of problems listed above: ? ?Refractory HTN ? ?Hypothyroidism  ? ?Tobacco use ? ?Obesity ? ?PSVT ? ?Disposition: Follow up {follow up:15908} with ***  ? ?Shared Decision Making/Informed Consent   ?{Are you ordering a CV Procedure (e.g. stress test, cath, DCCV, TEE, etc)?   Press F2        :[id number]}  ? ? ?Signed, ?Carless Slatten Ninfa Meeker, PA-C   ?10/10/2021 12:10 PM    ?[place]  ?

## 2021-10-11 ENCOUNTER — Ambulatory Visit: Payer: BC Managed Care – PPO | Admitting: Medical

## 2021-11-11 ENCOUNTER — Other Ambulatory Visit: Payer: Self-pay | Admitting: Cardiovascular Disease

## 2021-11-12 ENCOUNTER — Encounter: Payer: Self-pay | Admitting: Intensive Care

## 2021-11-12 ENCOUNTER — Other Ambulatory Visit: Payer: Self-pay

## 2021-11-12 ENCOUNTER — Emergency Department: Payer: BC Managed Care – PPO

## 2021-11-12 ENCOUNTER — Emergency Department
Admission: EM | Admit: 2021-11-12 | Discharge: 2021-11-12 | Disposition: A | Discharge status: Home / Self Care | Payer: BC Managed Care – PPO | Attending: Emergency Medicine | Admitting: Emergency Medicine

## 2021-11-12 DIAGNOSIS — E039 Hypothyroidism, unspecified: Secondary | ICD-10-CM | POA: Diagnosis not present

## 2021-11-12 DIAGNOSIS — M25561 Pain in right knee: Secondary | ICD-10-CM | POA: Insufficient documentation

## 2021-11-12 DIAGNOSIS — I10 Essential (primary) hypertension: Secondary | ICD-10-CM | POA: Diagnosis not present

## 2021-11-12 MED ORDER — DICLOFENAC SODIUM 1 % EX GEL: 4.0000 g | Freq: Four times a day (QID) | CUTANEOUS | 2 refills | Status: AC

## 2021-11-12 NOTE — Discharge Instructions (Addendum)
-  Take Tylenol as needed for pain.  You may also utilize the Voltaren gel as needed.  You may keep the brace on as long as needed for comfort. ?-Follow-up with the orthopedist listed above as needed. ?-Return to the emergency department anytime if you begin to experience any new or worsening symptoms ?

## 2021-11-12 NOTE — ED Provider Notes (Signed)
? ?[facility] ?Provider Note ? ? ? Event Date/Time  ? First MD Initiated Contact with Patient 11/12/21 1237   ?  (approximate) ? ? ?History  ? ?Chief Complaint ?Knee Pain ? ? ?HPI ?Bianca Acosta is a 46 y.o. female, with morbid obesity, hypertension, OSA, hypothyroidism, presents to the emergency department for evaluation of right knee pain.  Patient states that this been going on for the past 2 to 3 days.  She states that she first noticed it when getting out of her car the other day.  Denies any significant falls, though did state that she may have twisted her knee awkwardly the other day.  She states that the pain hurts worse whenever she puts pressure on it.  She says that she is still able to ambulate, though with difficulty.  Denies fever/chills, chest pain, shortness of breath, abdominal pain, urinary symptoms, nausea/vomiting,  ? ?History Limitations: No limitations. ? ?  ? ? ?Physical Exam  ?Triage Vital Signs: ?ED Triage Vitals  ?Enc Vitals Group  ?   BP 11/12/21 1229 125/76  ?   Pulse Rate 11/12/21 1229 78  ?   Resp 11/12/21 1229 16  ?   Temp 11/12/21 1229 98 ?F (36.7 ?C)  ?   Temp Source 11/12/21 1229 Oral  ?   SpO2 11/12/21 1229 94 %  ?   Weight 11/12/21 1232 250 lb (113.4 kg)  ?   Height 11/12/21 1232 '5\' 3"'$  (1.6 m)  ?   Head Circumference --   ?   Peak Flow --   ?   Pain Score 11/12/21 1232 10  ?   Pain Loc --   ?   Pain Edu? --   ?   Excl. in [place]? --   ? ? ?Most recent vital signs: ?Vitals:  ? 11/12/21 1229  ?BP: 125/76  ?Pulse: 78  ?Resp: 16  ?Temp: 98 ?F (36.7 ?C)  ?SpO2: 94%  ? ? ?General: Awake, NAD.  ?Skin: Warm, dry.  ?CV: Good peripheral perfusion.  ?Resp: Normal effort.  ?Abd: Soft, non-tender. No distention.  ?Neuro: At baseline. No gross neurological deficits.  ?Other: No deformities present along the right lower extremity.  Patient maintains good flexion extension at the knee joint.  No tenderness around the patella or tibial plateau.  No tenderness on the hip or  femoral region.  No surrounding warmth or erythema of the knee joint.  Negative anterior drawer.  Mild pain with valgus/varus maneuvering.  Pulse, motor, sensation intact distally ? ?Physical Exam ? ? ? ?ED Results / Procedures / Treatments  ?Labs ?(all labs ordered are listed, but only abnormal results are displayed) ?Labs Reviewed - No data to display ? ? ?EKG ?Not applicable. ? ? ?RADIOLOGY ? ?ED Provider Interpretation: I personally reviewed and interpreted this x-ray.  No evidence of acute fracture or dislocation based on my interpretation. ? ?DG Knee Complete 4 Views Right ? ?Result Date: 11/12/2021 ?CLINICAL DATA:  Right knee pain for 1 week.  No known injury. EXAM: RIGHT KNEE - COMPLETE 4+ VIEW COMPARISON:  None. FINDINGS: No evidence of fracture, dislocation, or joint effusion. Mild tricompartmental osteoarthritis. Bidirectional patellar enthesophytes. Soft tissues are unremarkable. IMPRESSION: No acute osseous abnormality, right knee. Mild tricompartmental osteoarthritis. Electronically Signed   By: Davina Poke D.O.   On: 11/12/2021 13:18   ? ?PROCEDURES: ? ?Critical Care performed: None. ? ?Procedures ? ? ? ?MEDICATIONS ORDERED IN ED: ?Medications - No data to display ? ? ?IMPRESSION /  MDM / ASSESSMENT AND PLAN / ED COURSE  ?I reviewed the triage vital signs and the nursing notes. ?             ?               ? ? ?Differential diagnosis includes, but is not limited to, knee sprain, osteoarthritis, septic arthritis, ACL/PCL injury, MCL/LCL injury, meniscus injury. ? ?ED Course ?Patient appears well.  Vital signs within normal limits.  NAD. ? ?X-ray shows evidence of mild tricompartmental osteoarthritis.  No acute fractures or dislocations. ? ?Assessment/Plan ?Presentation consistent with knee sprain versus osteoarthritis.  Provided the patient with a knee brace.  We will provide her with a prescription for Voltaren gel as well.  Encouraged her to supplement with Tylenol as needed.  Advised her to  follow-up with orthopedics if she continues to experience persistent pain despite conservative management.  Patient expressed understanding and agreed with the plan.  We will plan to discharge ? ?Patient was provided with anticipatory guidance, return precautions, and educational material. Encouraged the patient to return to the emergency department at any time if they begin to experience any new or worsening symptoms.  ? ?  ? ? ?FINAL CLINICAL IMPRESSION(S) / ED DIAGNOSES  ? ?Final diagnoses:  ?Acute pain of right knee  ? ? ? ?Rx / DC Orders  ? ?ED Discharge Orders   ? ?      Ordered  ?  diclofenac Sodium (VOLTAREN) 1 % GEL  4 times daily       ? 11/12/21 1338  ? ?  ?  ? ?  ? ? ? ?Note:  This document was prepared using Dragon voice recognition software and may include unintentional dictation errors. ?  ?Teodoro Spray, [place] ?11/12/21 1506 ? ?  ?Rada Hay, MD ?11/12/21 1640 ? ?

## 2021-11-12 NOTE — ED Notes (Signed)
See triage note  presents with right knee pain  states she is not sure of injury  developed pain when getting out of car  pain is to lateral aspect of knee   unable to bear full wt ?

## 2021-11-12 NOTE — ED Triage Notes (Signed)
Patient c/o right knee pain. Denies injury ?

## 2021-11-13 NOTE — Telephone Encounter (Signed)
Please schedule overdue 6 month F/U appointment. Thank you! °

## 2021-11-13 NOTE — Telephone Encounter (Signed)
Attempted to schedule.  LMOV to call office.  ° °

## 2021-11-17 ENCOUNTER — Ambulatory Visit: Payer: BC Managed Care – PPO | Admitting: Internal Medicine

## 2021-11-21 NOTE — Telephone Encounter (Signed)
No show for last appt in march .  Lmov to call office .  Mailed letter for no show so closing this encounter.  ?

## 2021-11-24 DIAGNOSIS — M2391 Unspecified internal derangement of right knee: Secondary | ICD-10-CM | POA: Diagnosis not present

## 2021-11-24 DIAGNOSIS — M25561 Pain in right knee: Secondary | ICD-10-CM | POA: Diagnosis not present

## 2021-11-27 ENCOUNTER — Other Ambulatory Visit: Payer: Self-pay | Admitting: Cardiovascular Disease

## 2021-11-29 ENCOUNTER — Other Ambulatory Visit: Payer: Self-pay

## 2021-11-29 ENCOUNTER — Encounter: Payer: Self-pay | Admitting: Emergency Medicine

## 2021-11-29 ENCOUNTER — Emergency Department: Payer: BC Managed Care – PPO

## 2021-11-29 ENCOUNTER — Emergency Department
Admission: EM | Admit: 2021-11-29 | Discharge: 2021-11-29 | Disposition: A | Discharge status: Home / Self Care | Payer: BC Managed Care – PPO | Attending: Emergency Medicine | Admitting: Emergency Medicine

## 2021-11-29 DIAGNOSIS — R059 Cough, unspecified: Secondary | ICD-10-CM | POA: Diagnosis not present

## 2021-11-29 DIAGNOSIS — R062 Wheezing: Secondary | ICD-10-CM | POA: Diagnosis not present

## 2021-11-29 DIAGNOSIS — R0602 Shortness of breath: Secondary | ICD-10-CM | POA: Diagnosis not present

## 2021-11-29 DIAGNOSIS — Z20822 Contact with and (suspected) exposure to covid-19: Secondary | ICD-10-CM | POA: Diagnosis not present

## 2021-11-29 DIAGNOSIS — R000 Tachycardia, unspecified: Secondary | ICD-10-CM | POA: Insufficient documentation

## 2021-11-29 DIAGNOSIS — J45901 Unspecified asthma with (acute) exacerbation: Secondary | ICD-10-CM | POA: Insufficient documentation

## 2021-11-29 DIAGNOSIS — I10 Essential (primary) hypertension: Secondary | ICD-10-CM | POA: Diagnosis not present

## 2021-11-29 LAB — CBC WITH DIFFERENTIAL/PLATELET
Abs Immature Granulocytes: 0.03 10*3/uL (ref 0.00–0.07)
Basophils Absolute: 0 10*3/uL (ref 0.0–0.1)
Basophils Relative: 1 %
Eosinophils Absolute: 0.2 10*3/uL (ref 0.0–0.5)
Eosinophils Relative: 4 %
HCT: 40.3 % (ref 36.0–46.0)
Hemoglobin: 13 g/dL (ref 12.0–15.0)
Immature Granulocytes: 0 %
Lymphocytes Relative: 19 %
Lymphs Abs: 1.3 10*3/uL (ref 0.7–4.0)
MCH: 27.1 pg (ref 26.0–34.0)
MCHC: 32.3 g/dL (ref 30.0–36.0)
MCV: 84 fL (ref 80.0–100.0)
Monocytes Absolute: 0.4 10*3/uL (ref 0.1–1.0)
Monocytes Relative: 6 %
Neutro Abs: 4.9 10*3/uL (ref 1.7–7.7)
Neutrophils Relative %: 70 %
Platelets: 238 10*3/uL (ref 150–400)
RBC: 4.8 MIL/uL (ref 3.87–5.11)
RDW: 14.2 % (ref 11.5–15.5)
WBC: 6.9 10*3/uL (ref 4.0–10.5)
nRBC: 0.3 % — ABNORMAL HIGH (ref 0.0–0.2)

## 2021-11-29 LAB — RESP PANEL BY RT-PCR (FLU A&B, COVID) ARPGX2
Influenza A by PCR: NEGATIVE
Influenza B by PCR: NEGATIVE
SARS Coronavirus 2 by RT PCR: NEGATIVE

## 2021-11-29 LAB — BASIC METABOLIC PANEL
Anion gap: 5 (ref 5–15)
BUN: 15 mg/dL (ref 6–20)
CO2: 26 mmol/L (ref 22–32)
Calcium: 8.7 mg/dL — ABNORMAL LOW (ref 8.9–10.3)
Chloride: 106 mmol/L (ref 98–111)
Creatinine, Ser: 1 mg/dL (ref 0.44–1.00)
GFR, Estimated: >60 mL/min (ref >60)
Glucose, Bld: 173 mg/dL — ABNORMAL HIGH (ref 70–99)
Potassium: 3.7 mmol/L (ref 3.5–5.1)
Sodium: 137 mmol/L (ref 135–145)

## 2021-11-29 MED ORDER — ALBUTEROL SULFATE HFA 108 (90 BASE) MCG/ACT IN AERS: 2.0000 | INHALATION_SPRAY | RESPIRATORY_TRACT | 0 refills | Status: DC | PRN

## 2021-11-29 MED ORDER — PREDNISONE 20 MG PO TABS
60.0000 mg | ORAL_TABLET | Freq: Every day | ORAL | 0 refills | Status: AC
Start: 2021-11-29 — End: 2021-12-03

## 2021-11-29 MED ORDER — IPRATROPIUM-ALBUTEROL 0.5-2.5 (3) MG/3ML IN SOLN
9.0000 mL | Freq: Once | RESPIRATORY_TRACT | Status: AC
  Administered 2021-11-29: 9 mL via RESPIRATORY_TRACT
  Filled 2021-11-29: qty 9

## 2021-11-29 MED ORDER — IPRATROPIUM-ALBUTEROL 0.5-2.5 (3) MG/3ML IN SOLN
6.0000 mL | Freq: Once | RESPIRATORY_TRACT | Status: AC
  Administered 2021-11-29: 6 mL via RESPIRATORY_TRACT
  Filled 2021-11-29: qty 6

## 2021-11-29 MED ORDER — METHYLPREDNISOLONE SODIUM SUCC 125 MG IJ SOLR
125.0000 mg | INTRAMUSCULAR | Status: AC
  Administered 2021-11-29: 125 mg via INTRAVENOUS
  Filled 2021-11-29: qty 2

## 2021-11-29 MED ORDER — GUAIFENESIN ER 600 MG PO TB12: 600.0000 mg | ORAL_TABLET | Freq: Two times a day (BID) | ORAL | 0 refills | Status: AC

## 2021-11-29 MED ORDER — MAGNESIUM SULFATE 2 GM/50ML IV SOLN
2.0000 g | INTRAVENOUS | Status: AC
  Administered 2021-11-29: 2 g via INTRAVENOUS
  Filled 2021-11-29: qty 50

## 2021-11-29 NOTE — ED Notes (Signed)
D/C and new RX dicussed with pt, pt verbalized understanding. NAD noted on D/C. Pt ambulatory on D/C.  ?

## 2021-11-29 NOTE — ED Notes (Signed)
Pt not keeping BP cuff on at this time, pt states "I want to be able to walk around'.  ?

## 2021-11-29 NOTE — ED Notes (Signed)
Pt presents to ED with c/o of asthma exacerbation. Pt states she started to have a productive cough yesterday that produced whitish sputum. Pt states she started today with SOB and has been taking rescue inhaler hourly with minimal relief. Pt states she has a ned machine at home but does not have any medication solution for it. Pt also reports tightness in chest with inspiration, pt denies HX of blood clots. Pt is NAD at this time, pt does have some issues with speaking in full sentences at this time. ? ?Pt does have exp wheezes throughout.  ?

## 2021-11-29 NOTE — ED Notes (Signed)
X RAY at bedside 

## 2021-11-29 NOTE — ED Triage Notes (Signed)
Pt to ED via POV c/o cough. Pt states that she has been using her inhaler without much relief. Pt states that symptom started yesterday but have gotten worse over night. Pt states that she feels short of breath and that her chest is tight. Pt denies fever. Pt with increased WOB. ?

## 2021-11-29 NOTE — ED Provider Notes (Signed)
? ?[facility] ?Provider Note ? ? ? Event Date/Time  ? First MD Initiated Contact with Patient 11/29/21 0914   ?  (approximate) ? ? ?History  ? ?Cough ? ? ?HPI ? ?Bianca Acosta is a 46 y.o. female with a history of obesity, hypertension, asthma who comes ED complaining of cough and shortness of breath that started last night.  She has been using her albuterol inhaler through the night without relief.  No fever or chills.  No significant chest pain.  No lower extremity pain or swelling.  No recent travel, trauma hospitalization or surgery.  No history of DVT or PE. ?  ? ? ?Physical Exam  ? ?Triage Vital Signs: ?ED Triage Vitals  ?Enc Vitals Group  ?   BP 11/29/21 0901 (!) 149/85  ?   Pulse Rate 11/29/21 0857 (!) 102  ?   Resp 11/29/21 0857 (!) 22  ?   Temp 11/29/21 0857 98.4 ?F (36.9 ?C)  ?   Temp src --   ?   SpO2 11/29/21 0857 93 %  ?   Weight 11/29/21 0900 258 lb (117 kg)  ?   Height 11/29/21 0900 '5\' 3"'$  (1.6 m)  ?   Head Circumference --   ?   Peak Flow --   ?   Pain Score 11/29/21 0900 0  ?   Pain Loc --   ?   Pain Edu? --   ?   Excl. in [place]? --   ? ? ?Most recent vital signs: ?Vitals:  ? 11/29/21 1100 11/29/21 1230  ?BP: 132/81 119/62  ?Pulse: 98 (!) 105  ?Resp: 14 (!) 33  ?Temp:    ?SpO2: 94% 93%  ? ? ? ?General: Awake, no distress.  ?CV:  Good peripheral perfusion.  Regular rate and rhythm ?Resp:  Normal effort.  Wheezy with prolonged expiratory phase bilaterally.  Symmetric air movement.  No focal crackles. ?Abd:  No distention.  ?Other:  No lower extremity edema.  No rash ? ? ?ED Results / Procedures / Treatments  ? ?Labs ?(all labs ordered are listed, but only abnormal results are displayed) ?Labs Reviewed  ?BASIC METABOLIC PANEL - Abnormal; Notable for the following components:  ?    Result Value  ? Glucose, Bld 173 (*)   ? Calcium 8.7 (*)   ? All other components within normal limits  ?CBC WITH DIFFERENTIAL/PLATELET - Abnormal; Notable for the following components:  ? nRBC 0.3  (*)   ? All other components within normal limits  ?RESP PANEL BY RT-PCR (FLU A&B, COVID) ARPGX2  ? ? ? ?EKG ? ?Interpreted by me ?Sinus tachycardia rate 106.  Normal axis and intervals.  Normal QRS ST segments and T waves. ? ? ?RADIOLOGY ?Chest x-ray viewed and interpreted by me, unremarkable without signs of infection or pneumothorax.  Radiology report reviewed. ? ? ? ?PROCEDURES: ? ?Critical Care performed: No ? ?Procedures ? ? ?MEDICATIONS ORDERED IN ED: ?Medications  ?methylPREDNISolone sodium succinate (SOLU-MEDROL) 125 mg/2 mL injection 125 mg (125 mg Intravenous Given 11/29/21 0935)  ?ipratropium-albuterol (DUONEB) 0.5-2.5 (3) MG/3ML nebulizer solution 9 mL (9 mLs Nebulization Given 11/29/21 0935)  ?magnesium sulfate IVPB 2 g 50 mL (0 g Intravenous Stopped 11/29/21 1329)  ?ipratropium-albuterol (DUONEB) 0.5-2.5 (3) MG/3ML nebulizer solution 6 mL (6 mLs Nebulization Given 11/29/21 1310)  ? ? ? ?IMPRESSION / MDM / ASSESSMENT AND PLAN / ED COURSE  ?I reviewed the triage vital signs and the nursing notes. ?             ?               ? ?  Differential diagnosis includes, but is not limited to, asthma exacerbation, pneumonia, viral illness.  Doubt PE, non-STEMI, pulmonary edema, pneumothorax, pericardial effusion ? ? ? ?Patient presents with shortness of breath and cough.  Clinically apparent bronchospasm/asthma exacerbation.  This is an acute exacerbation of chronic medical issue.  No evidence of DVT or PE.  She is nontoxic.  Will give IV Solu-Medrol and DuoNebs and reassess. ?Clinical Course as of 11/29/21 1519  ?Wed Nov 29, 2021  ?1154 Feel improved, but still pretty wheezy. Will give iv mag and more duonebs.  [PS]  ?  ?Clinical Course User Index ?[PS] Carrie Mew, MD  ? ? ?----------------------------------------- ?3:19 PM on 11/29/2021 ?----------------------------------------- ?Continues to feel improved.  Chest tightness is resolved.  She is tachycardic which is I think due to the beta agonist effect.   Still has some mild wheezing, offered admission but patient feels comfortable with discharge.  We will send a prescription for prednisone and albuterol and Mucinex to her pharmacy.  She will plan to follow-up with her pulmonologist.  Return precautions discussed. ? ? ?FINAL CLINICAL IMPRESSION(S) / ED DIAGNOSES  ? ?Final diagnoses:  ?Exacerbation of asthma, unspecified asthma severity, unspecified whether persistent  ? ? ? ?Rx / DC Orders  ? ?ED Discharge Orders   ? ?      Ordered  ?  albuterol (PROVENTIL HFA) 108 (90 Base) MCG/ACT inhaler  Every 4 hours PRN       ? 11/29/21 1519  ?  predniSONE (DELTASONE) 20 MG tablet  Daily with breakfast       ? 11/29/21 1519  ?  guaiFENesin (MUCINEX) 600 MG 12 hr tablet  2 times daily       ? 11/29/21 1519  ? ?  ?  ? ?  ? ? ? ?Note:  This document was prepared using Dragon voice recognition software and may include unintentional dictation errors. ?  ?Carrie Mew, MD ?11/29/21 1520 ? ?

## 2021-12-18 ENCOUNTER — Other Ambulatory Visit: Payer: Self-pay

## 2021-12-18 MED ORDER — METHIMAZOLE 5 MG PO TABS: 2.5000 mg | ORAL_TABLET | Freq: Every day | ORAL | 0 refills | Status: DC

## 2021-12-21 ENCOUNTER — Other Ambulatory Visit: Payer: Self-pay | Admitting: Cardiovascular Disease

## 2021-12-26 ENCOUNTER — Ambulatory Visit: Payer: BC Managed Care – PPO | Admitting: Nurse Practitioner

## 2022-01-02 ENCOUNTER — Ambulatory Visit: Payer: BC Managed Care – PPO | Admitting: Adult Health

## 2022-01-02 ENCOUNTER — Ambulatory Visit (INDEPENDENT_AMBULATORY_CARE_PROVIDER_SITE_OTHER): Payer: BC Managed Care – PPO | Admitting: Nurse Practitioner

## 2022-01-02 ENCOUNTER — Encounter: Payer: Self-pay | Admitting: Nurse Practitioner

## 2022-01-02 VITALS — BP 128/78 | HR 88 | Ht 63.0 in | Wt 261.5 lb

## 2022-01-02 DIAGNOSIS — E0590 Thyrotoxicosis, unspecified without thyrotoxic crisis or storm: Secondary | ICD-10-CM | POA: Diagnosis not present

## 2022-01-02 DIAGNOSIS — R002 Palpitations: Secondary | ICD-10-CM | POA: Diagnosis not present

## 2022-01-02 DIAGNOSIS — I10 Essential (primary) hypertension: Secondary | ICD-10-CM

## 2022-01-02 DIAGNOSIS — I471 Supraventricular tachycardia: Secondary | ICD-10-CM | POA: Diagnosis not present

## 2022-01-02 DIAGNOSIS — Z6841 Body Mass Index (BMI) 40.0 and over, adult: Secondary | ICD-10-CM

## 2022-01-02 NOTE — Patient Instructions (Signed)
Medication Instructions:  No changes at this time.   *If you need a refill on your cardiac medications before your next appointment, please call your pharmacy*   Lab Work: None  If you have labs (blood work) drawn today and your tests are completely normal, you will receive your results only by: MyChart Message (if you have MyChart) OR A paper copy in the mail If you have any lab test that is abnormal or we need to change your treatment, we will call you to review the results.   Testing/Procedures: None   Follow-Up: At [facility], you and your health needs are our priority.  As part of our continuing mission to provide you with exceptional heart care, we have created designated Provider Care Teams.  These Care Teams include your primary Cardiologist (physician) and Advanced Practice Providers (APPs -  Physician Assistants and Nurse Practitioners) who all work together to provide you with the care you need, when you need it.   Your next appointment:   6 month(s)  The format for your next appointment:   In Person  Provider:   Muhammad Arida, MD or Christopher Berge, NP    Important Information About Sugar       

## 2022-01-02 NOTE — Progress Notes (Signed)
Office Visit    Patient Name: Bianca Acosta Date of Encounter: 01/02/2022  Primary Care Provider:  Pccm, Ander Gaster, MD Primary Cardiologist:  Kathlyn Sacramento, MD  Chief Complaint    43 female with a history of hypertension, tobacco abuse, obesity, sleep apnea, PSVT, chest pain with coronary calcium score of 0, thyroid nodule, and brain aneurysm status postcraniotomy in 2016, who presents for follow-up related to HTN and palpitations.  Past Medical History    Past Medical History:  Diagnosis Date   Brain aneurysm 06/29/2015   a. s/p craniotomy.   Chest pain    a. 09/2019 Cardiac CT: Ca2+ score = Zero.   Chicken pox    Headache    History of echocardiogram    a. 12/2018 Echo: EF 60-65%. DD. Mild LAE; b. 11/2019 Echo: EF 60-65%, no rwma, nl PASP.    Hypertension    PSVT (paroxysmal supraventricular tachycardia) ([place])    a. 08/2019 Zio: Avg HR 84 (NSR). 4 beats WCT (likely SVT w/ aberrancy). 4 SVT episodes, longes 9.9 secs. Rare PACs/PVCs (<1%). Some triggered events = SVT and/or PVCs.   Sleep apnea 10/2018   a. Does not tolerate CPAP.   Thyroid nodule greater than or equal to 1.5 cm in diameter incidentally noted on imaging study    a. 09/2019 CTA Chest: Ill-defined 2.5cm L thyroid nodule - rec f/u U/S; b. 10/2019 Thyroid U/S: 4.4cm L-sided thyroid nodule. FNA rec; c. 11/2019 FNA benign.   Tobacco abuse    Past Surgical History:  Procedure Laterality Date   CRANIOTOMY Right 06/29/2015   Procedure: CRANIOTOMY INTRACRANIAL ANEURYSM CLIPPING;  Surgeon: Kevan Ny Ditty, MD;  Location: [facility];  Service: Neurosurgery;  Laterality: Right;   IR GENERIC HISTORICAL  05/25/2016   IR ANGIO INTRA EXTRACRAN SEL INTERNAL CAROTID BILAT MOD SED 05/25/2016 Consuella Lose, MD [facility]   IR GENERIC HISTORICAL  06/29/2015   IR ANGIO INTRA EXTRACRAN SEL INTERNAL CAROTID BILAT MOD SED 06/29/2015 Consuella Lose, MD [facility]   IR GENERIC HISTORICAL  06/29/2015   IR ANGIO  VERTEBRAL SEL VERTEBRAL UNI L MOD SED 06/29/2015 Consuella Lose, MD [facility]   IR GENERIC HISTORICAL  06/29/2015   IR 3D INDEPENDENT WKST 06/29/2015 Consuella Lose, MD [facility]   UTERINE FIBROID SURGERY  2020    Allergies  Allergies  Allergen Reactions   Amoxicillin    Augmentin [Amoxicillin-Pot Clavulanate] Hives   Ciprofloxacin Itching    Patient states she starts itching from inside or internal.   Clavulanic Acid     History of Present Illness    46 year old female with the above complex past medical history including hypertension, tobacco abuse, obesity, sleep apnea, PSVT, chest pain, thyroid nodule, tobacco abuse, and brain aneurysm status postcraniotomy in 2016.  She has had difficult to control hypertension dating back to her 47s.  Echocardiogram May 2020 showed normal LV function with diastolic dysfunction, and mild left atrial lodgment.  In January 2021, she had an episode of chest pain and also more frequent palpitations.  Cardiac CT for calcium scoring was performed with a calcium score of 0.  Event monitoring showed 4 beats of wide-complex tachycardia felt to be SVT with aberrancy, along with 4 episodes of SVT, the longest lasting 9.9 seconds.  Some triggered events were associated SVT and/or PVCs.  She was later found to be hyperthyroid, and subsequently incidentally noted to have a 2.5 cm left thyroid nodule on CT of the chest in February 2021.  She  has been followed by endocrinology and is on methimazole.  Around the same time, she was having significant hypokalemia in the setting of chlorthalidone therapy, and that was discontinued, with subsequent development of weight gain and swelling of her extremities.  She was placed on triamterene HCTZ 37.5/25 mg daily, with improvement in swelling and blood pressure.  [name] was last seen in cardiology clinic in July 2022, at which time blood pressure remained well controlled.  Over the past 10 months, [name] has  noted occasional palpitations.  This is usually after eating a large meal and then walking up steps.  She notes that sometimes it feels like her heart is pounding though not necessarily fast.  There are no associated symptoms.  She does not typically experience dyspnea when walking up steps or other activities.  Palpitations occur randomly and 1-2 times per month, without any particular pattern.  She continues to be followed by endocrinology and her TSH has been normal on methimazole therapy.  She does have a goiter and is still considering whether or not she like to go forward with thyroidectomy.  She has been bothered by right knee pain for which, she is seeing orthopedics.  She was also seen in the emergency department on April 19 secondary to asthma flare, which she believes was related to allergies.  She has an albuterol inhaler that she says she rarely uses.  She has not had any recurrence of asthma type symptoms.  She denies chest pain, PND, orthopnea, dizziness, syncope, edema, or early satiety.  Home Medications    Current Outpatient Medications  Medication Sig Dispense Refill   albuterol (PROVENTIL HFA) 108 (90 Base) MCG/ACT inhaler Inhale 2 puffs into the lungs every 4 (four) hours as needed for wheezing or shortness of breath. 1 each 0   carvedilol (COREG) 12.5 MG tablet Take 1 tablet (12.5 mg total) by mouth 2 (two) times daily. 180 tablet 0   cetirizine (ZYRTEC) 10 MG tablet TAKE 1 TABLET BY MOUTH EVERY DAY 30 tablet 0   cholecalciferol (VITAMIN D3) 25 MCG (1000 UT) tablet Take 1 tablet (1,000 Units total) by mouth daily. 90 tablet 3   methimazole (TAPAZOLE) 5 MG tablet Take 0.5 tablets (2.5 mg total) by mouth daily. 45 tablet 0   Probiotic Product (FORTIFY PROBIOTIC WOMENS EX ST) CPDR Take 1 capsule by mouth daily. 30 capsule 2   spironolactone (ALDACTONE) 25 MG tablet Take 1 tablet (25 mg total) by mouth daily. 30 tablet 0   triamterene-hydrochlorothiazide (DYAZIDE) 37.5-25 MG capsule  TAKE 1 EACH (1 CAPSULE TOTAL) BY MOUTH DAILY. 90 capsule 0   KLOR-CON M20 20 MEQ tablet TAKE 1 TABLET BY MOUTH EVERY DAY (Patient not taking: Reported on 01/02/2022) 90 tablet 0   No current facility-administered medications for this visit.     Review of Systems    Occasional palpitations described as hard heartbeats.  Recent ED evaluation for asthma flare.  Right knee pain.  No chest pain, dyspnea, PND, orthopnea, dizziness, syncope, edema, or early satiety.  All other systems reviewed and are otherwise negative except as noted above.    Physical Exam    VS:  BP 128/78 (BP Location: Left Arm, Patient Position: Sitting, Cuff Size: Large)   Pulse 88   Ht '5\' 3"'$  (1.6 m)   Wt 261 lb 8 oz (118.6 kg)   SpO2 98%   BMI 46.32 kg/m  , BMI Body mass index is 46.32 kg/m.     GEN: Obese, in no  acute distress. HEENT: normal. Neck: Supple, no JVD, carotid bruits.  Thyroid goiter noted. Cardiac: RRR, no murmurs, rubs, or gallops. No clubbing, cyanosis, edema.  Radials/DP/PT 2+ and equal bilaterally.  Respiratory:  Respirations regular and unlabored, clear to auscultation bilaterally. GI: Obese, soft, nontender, nondistended, BS + x 4. MS: no deformity or atrophy. Skin: warm and dry, no rash. Neuro:  Strength and sensation are intact. Psych: Normal affect.  Accessory Clinical Findings    ECG personally reviewed by me today -regular sinus rhythm, 88, nonspecific T changes- no acute changes.  Lab Results  Component Value Date   WBC 6.9 11/29/2021   HGB 13.0 11/29/2021   HCT 40.3 11/29/2021   MCV 84.0 11/29/2021   PLT 238 11/29/2021   Lab Results  Component Value Date   CREATININE 1.00 11/29/2021   BUN 15 11/29/2021   NA 137 11/29/2021   K 3.7 11/29/2021   CL 106 11/29/2021   CO2 26 11/29/2021   Lab Results  Component Value Date   ALT 17 05/19/2021   AST 16 05/19/2021   ALKPHOS 51 10/11/2019   BILITOT 0.5 05/19/2021   Lab Results  Component Value Date   CHOL 134 05/16/2018    HDL 49 (L) 05/16/2018   LDLCALC 69 05/16/2018   TRIG 81 05/16/2018   CHOLHDL 2.7 05/16/2018    Lab Results  Component Value Date   HGBA1C 6.0 (H) 10/09/2019    Assessment & Plan    1.  Palpitations/PSVT: Patient with prior history of palpitations and monitoring in January 2021 showing a few brief episodes of PSVT, which have been managed with carvedilol therapy.  She occasionally notes mild elevations in heart rates as well as pounding of her heart, especially after eating certain types of meals and then walking up stairs.  There were no associated symptoms.  Symptoms occur 1-2 times per month without any particular pattern.  We discussed possibly remonitoring to further elucidate if she is having more SVT though given sparse nature of symptoms, its not clear that we would capture anything.  We also discussed the use of a kardia mobile device and she is going to look into purchasing one.  Continue beta-blocker therapy.  Hypothyroidism being managed by endocrinology with methimazole therapy, and thyroid labs were stable in October.   2.  Essential hypertension: Blood pressures controlled on carvedilol, spironolactone, and triamterene-HCTZ.  Labs are stable in April with a creatinine of 1.0 and potassium of 3.7.  3.  Hyperthyroidism: On methimazole.  Follow-up with endocrine scheduled for August.  Patient considering thyroidectomy.  4.  Morbid obesity: Patient works 6 to 7 days a week and notes limited time for activity.  She frequently does not eat throughout the day and then eats quite a bit in the evening and prior to bed.  We discussed strategies for weight loss and appropriate nutrition/heart healthy diet as well as goals for activity.  She has been experiencing right knee pain, which is limited activity recently.  5.  Sleep apnea: Uses CPAP.  6.  Asthma: No active wheezing today.  Seen in the ED in April and received a short course of steroids.  Has an albuterol inhaler but rarely uses.   She understands that albuterol may increase heart rate/palpitations.  7.  Disposition: Follow-up in 6 months or sooner if necessary.   Murray Hodgkins, NP 01/02/2022, 12:19 PM

## 2022-01-04 ENCOUNTER — Other Ambulatory Visit: Payer: Self-pay | Admitting: Cardiovascular Disease

## 2022-01-20 ENCOUNTER — Other Ambulatory Visit: Payer: Self-pay | Admitting: Cardiovascular Disease

## 2022-01-22 NOTE — Telephone Encounter (Signed)
Rx request sent to pharmacy.  

## 2022-02-20 ENCOUNTER — Telehealth: Payer: Self-pay | Admitting: Cardiovascular Disease

## 2022-02-20 MED ORDER — CARVEDILOL 12.5 MG PO TABS: 12.5000 mg | ORAL_TABLET | Freq: Two times a day (BID) | ORAL | 0 refills | Status: DC

## 2022-02-20 NOTE — Telephone Encounter (Signed)
Requested Prescriptions   Signed Prescriptions Disp Refills   carvedilol (COREG) 12.5 MG tablet 180 tablet 0    Sig: Take 1 tablet (12.5 mg total) by mouth 2 (two) times daily.    Authorizing Provider: Kathlyn Sacramento A    Ordering User: Raelene Bott, Kyaire Gruenewald L

## 2022-02-20 NOTE — Telephone Encounter (Signed)
*  STAT* If patient is at the pharmacy, call can be transferred to refill team.   1. Which medications need to be refilled? (please list name of each medication and dose if known)   carvedilol (COREG) 12.5 MG tablet  2. Which pharmacy/location (including street and city if local pharmacy) is medication to be sent to?  [id number] [address] [place] [address]. Do they need a 30 day or 90 day supply?   90 days   Patient stated she is out of this medication.

## 2022-04-12 ENCOUNTER — Encounter: Payer: Self-pay | Admitting: Internal Medicine

## 2022-04-12 ENCOUNTER — Ambulatory Visit (INDEPENDENT_AMBULATORY_CARE_PROVIDER_SITE_OTHER): Payer: BC Managed Care – PPO | Admitting: Internal Medicine

## 2022-04-12 VITALS — BP 130/80 | HR 89 | Ht 63.0 in | Wt 256.0 lb

## 2022-04-12 DIAGNOSIS — R7303 Prediabetes: Secondary | ICD-10-CM

## 2022-04-12 DIAGNOSIS — R7309 Other abnormal glucose: Secondary | ICD-10-CM | POA: Diagnosis not present

## 2022-04-12 DIAGNOSIS — E0590 Thyrotoxicosis, unspecified without thyrotoxic crisis or storm: Secondary | ICD-10-CM

## 2022-04-12 DIAGNOSIS — E041 Nontoxic single thyroid nodule: Secondary | ICD-10-CM | POA: Diagnosis not present

## 2022-04-12 LAB — CBC WITH DIFFERENTIAL/PLATELET
Basophils Absolute: 0 10*3/uL (ref 0.0–0.1)
Basophils Relative: 1 % (ref 0.0–3.0)
Eosinophils Absolute: 0.3 10*3/uL (ref 0.0–0.7)
Eosinophils Relative: 5.1 % — ABNORMAL HIGH (ref 0.0–5.0)
HCT: 41.2 % (ref 36.0–46.0)
Hemoglobin: 13.3 g/dL (ref 12.0–15.0)
Lymphocytes Relative: 44.5 % (ref 12.0–46.0)
Lymphs Abs: 2.2 10*3/uL (ref 0.7–4.0)
MCHC: 32.2 g/dL (ref 30.0–36.0)
MCV: 84 fl (ref 78.0–100.0)
Monocytes Absolute: 0.5 10*3/uL (ref 0.1–1.0)
Monocytes Relative: 10.7 % (ref 3.0–12.0)
Neutro Abs: 1.9 10*3/uL (ref 1.4–7.7)
Neutrophils Relative %: 38.7 % — ABNORMAL LOW (ref 43.0–77.0)
Platelets: 221 10*3/uL (ref 150.0–400.0)
RBC: 4.9 Mil/uL (ref 3.87–5.11)
RDW: 16.1 % — ABNORMAL HIGH (ref 11.5–15.5)
WBC: 4.9 10*3/uL (ref 4.0–10.5)

## 2022-04-12 LAB — POCT GLYCOSYLATED HEMOGLOBIN (HGB A1C): Hemoglobin A1C: 6.2 % — AB (ref 4.0–5.6)

## 2022-04-12 LAB — T4, FREE: Free T4: 0.87 ng/dL (ref 0.60–1.60)

## 2022-04-12 LAB — COMPREHENSIVE METABOLIC PANEL
ALT: 20 U/L (ref 0–35)
AST: 17 U/L (ref 0–37)
Albumin: 4 g/dL (ref 3.5–5.2)
Alkaline Phosphatase: 53 U/L (ref 39–117)
BUN: 13 mg/dL (ref 6–23)
CO2: 28 mEq/L (ref 19–32)
Calcium: 9.5 mg/dL (ref 8.4–10.5)
Chloride: 106 mEq/L (ref 96–112)
Creatinine, Ser: 1.03 mg/dL (ref 0.40–1.20)
GFR: 65.54 mL/min (ref >60.00)
Glucose, Bld: 106 mg/dL — ABNORMAL HIGH (ref 70–99)
Potassium: 4.3 mEq/L (ref 3.5–5.1)
Sodium: 141 mEq/L (ref 135–145)
Total Bilirubin: 0.4 mg/dL (ref 0.2–1.2)
Total Protein: 7.3 g/dL (ref 6.0–8.3)

## 2022-04-12 LAB — TSH: TSH: 0.83 u[IU]/mL (ref 0.35–5.50)

## 2022-04-12 MED ORDER — METHIMAZOLE 5 MG PO TABS: 2.5000 mg | ORAL_TABLET | Freq: Every day | ORAL | 1 refills | Status: DC

## 2022-04-12 NOTE — Progress Notes (Signed)
Name: Bianca Acosta  MRN/ DOB: [id number], 15-Nov-1975    Age/ Sex: 46 y.o., female     PCP: [facility], [place], MD   Reason for Endocrinology Evaluation: Hyperthyroidism     Initial Endocrinology Clinic Visit: 10/29/2019    PATIENT IDENTIFIER: Bianca Acosta is a 46 y.o., female with a past medical history of HTN, brain aneurysm, paroxysmal SVT and OSA. She has followed with [facility] since 10/29/2019 for consultative assistance with management of her hyperthyroidism.   HISTORICAL SUMMARY:   Patient has been noted with hyperthyroidism in 09/2019 during evaluation of resistant hypertension.  Her TSH was 0.270 uIU/mL , with an elevated free T4 at 1.25 ng/dL patient was also noted to have an incidental left thyroid nodule on CT scan which was ordered for evaluation of shortness of breath.     Methimazole was started 09/2019   TRAB was undetectable    A thyroid ultrasound 11/09/2019 showed a left inferior 4.4 cm nodule meeting FNA criteria which showed benign follicular nodule (Bethesda category II) on 11/26/2019. The pt opted to hold off on uptake and scan at the time.     No history of amiodarone use.   No FH of thyroid disease    SUBJECTIVE:    Today (04/12/2022):  Bianca Acosta is here for a follow up on toxic thyroid nodule.    Weight has been stable  Has occasional neck swelling , she is interested in surgical intervention She is motivated to lead a healthy lifestyle, she is currently focusing on weight loss and adjusting her diet accordingly, unfortunately she continues to smoke Denies abdominal pain or vomiting Denies palpitations  Denies tremors   Patient with history of prediabetes A1c 6.0% in 2021, concerned about developing diabetes  Methimazole 5 mg, half a tablet daily     HISTORY:  Past Medical History:  Past Medical History:  Diagnosis Date   Brain aneurysm 06/29/2015   a. s/p craniotomy.   Chest pain    a. 09/2019 Cardiac  CT: Ca2+ score = Zero.   Chicken pox    Headache    History of echocardiogram    a. 12/2018 Echo: EF 60-65%. DD. Mild LAE; b. 11/2019 Echo: EF 60-65%, no rwma, nl PASP.    Hypertension    PSVT (paroxysmal supraventricular tachycardia) ([place])    a. 08/2019 Zio: Avg HR 84 (NSR). 4 beats WCT (likely SVT w/ aberrancy). 4 SVT episodes, longes 9.9 secs. Rare PACs/PVCs (<1%). Some triggered events = SVT and/or PVCs.   Sleep apnea 10/2018   a. Does not tolerate CPAP.   Thyroid nodule greater than or equal to 1.5 cm in diameter incidentally noted on imaging study    a. 09/2019 CTA Chest: Ill-defined 2.5cm L thyroid nodule - rec f/u U/S; b. 10/2019 Thyroid U/S: 4.4cm L-sided thyroid nodule. FNA rec; c. 11/2019 FNA benign.   Tobacco abuse    Past Surgical History:  Past Surgical History:  Procedure Laterality Date   CRANIOTOMY Right 06/29/2015   Procedure: CRANIOTOMY INTRACRANIAL ANEURYSM CLIPPING;  Surgeon: Kevan Ny Ditty, MD;  Location: [facility];  Service: Neurosurgery;  Laterality: Right;   IR GENERIC HISTORICAL  05/25/2016   IR ANGIO INTRA EXTRACRAN SEL INTERNAL CAROTID BILAT MOD SED 05/25/2016 Consuella Lose, MD [facility]   IR GENERIC HISTORICAL  06/29/2015   IR ANGIO INTRA EXTRACRAN SEL INTERNAL CAROTID BILAT MOD SED 06/29/2015 Consuella Lose, MD [facility]   IR GENERIC HISTORICAL  06/29/2015   IR ANGIO  VERTEBRAL SEL VERTEBRAL UNI L MOD SED 06/29/2015 Consuella Lose, MD [facility]   IR GENERIC HISTORICAL  06/29/2015   IR 3D INDEPENDENT WKST 06/29/2015 Consuella Lose, MD [facility]   UTERINE FIBROID SURGERY  2020   Social History:  reports that she has been smoking cigarettes. She started smoking about 26 years ago. She has a 20.00 pack-year smoking history. She has never used smokeless tobacco. She reports current alcohol use. She reports that she does not use drugs. Family History:  Family History  Problem Relation Age of Onset   Hypertension Mother     Hypertension Father    Hypertension Maternal Aunt    Hypertension Maternal Uncle    Hypertension Paternal Aunt    Hypertension Paternal Uncle    Hypertension Maternal Grandmother    Hypertension Maternal Grandfather    Hypertension Paternal Grandmother    Hypertension Paternal Grandfather      HOME MEDICATIONS: Allergies as of 04/12/2022       Reactions   Amoxicillin    Augmentin [amoxicillin-pot Clavulanate] Hives   Ciprofloxacin Itching   Patient states she starts itching from inside or internal.   Clavulanic Acid         Medication List        Accurate as of April 12, 2022  1:11 PM. If you have any questions, ask your nurse or doctor.          STOP taking these medications    Klor-Con M20 20 MEQ tablet Generic drug: potassium chloride SA Stopped by: Dorita Sciara, MD       TAKE these medications    albuterol 108 (90 Base) MCG/ACT inhaler Commonly known as: Proventil HFA Inhale 2 puffs into the lungs every 4 (four) hours as needed for wheezing or shortness of breath.   carvedilol 12.5 MG tablet Commonly known as: COREG Take 1 tablet (12.5 mg total) by mouth 2 (two) times daily.   cetirizine 10 MG tablet Commonly known as: ZYRTEC TAKE 1 TABLET BY MOUTH EVERY DAY   cholecalciferol 25 MCG (1000 UNIT) tablet Commonly known as: VITAMIN D3 Take 1 tablet (1,000 Units total) by mouth daily.   Fortify Probiotic Womens Ex St Cpdr Take 1 capsule by mouth daily.   methimazole 5 MG tablet Commonly known as: TAPAZOLE Take 0.5 tablets (2.5 mg total) by mouth daily.   spironolactone 25 MG tablet Commonly known as: ALDACTONE TAKE 1 TABLET (25 MG TOTAL) BY MOUTH DAILY.   triamterene-hydrochlorothiazide 37.5-25 MG capsule Commonly known as: DYAZIDE TAKE 1 EACH (1 CAPSULE TOTAL) BY MOUTH DAILY.          OBJECTIVE:   PHYSICAL EXAM: VS: BP 130/80 (BP Location: Left Arm, Patient Position: Sitting, Cuff Size: Large)   Pulse 89   Ht '5\' 3"'$  (1.6  m)   Wt 256 lb (116.1 kg)   SpO2 97%   BMI 45.35 kg/m    EXAM: General: Pt appears well and is in NAD  Neck: General: Supple without adenopathy. Thyroid: Left thyroid nodule appreciated   Lungs: Scattered wheezing  Heart: Auscultation: RRR.  Abdomen: Normoactive bowel sounds, soft, nontender, without masses or organomegaly palpable  Extremities:  BL LE: No pretibial edema normal ROM and strength.  Mental Status: Judgment, insight: Intact Orientation: Oriented to time, place, and person Mood and affect: No depression, anxiety, or agitation     DATA REVIEWED:  Latest Reference Range & Units 04/12/22 11:28  Sodium 135 - 145 mEq/L 141  Potassium 3.5 - 5.1 mEq/L  4.3  Chloride 96 - 112 mEq/L 106  CO2 19 - 32 mEq/L 28  Glucose 70 - 99 mg/dL 106 (H)  BUN 6 - 23 mg/dL 13  Creatinine 0.40 - 1.20 mg/dL 1.03  Calcium 8.4 - 10.5 mg/dL 9.5  Alkaline Phosphatase 39 - 117 U/L 53  Albumin 3.5 - 5.2 g/dL 4.0  AST 0 - 37 U/L 17  ALT 0 - 35 U/L 20  Total Protein 6.0 - 8.3 g/dL 7.3  Total Bilirubin 0.2 - 1.2 mg/dL 0.4  GFR >60.00 mL/min 65.54  WBC 4.0 - 10.5 K/uL 4.9  RBC 3.87 - 5.11 Mil/uL 4.90  Hemoglobin 12.0 - 15.0 g/dL 13.3  HCT 36.0 - 46.0 % 41.2  MCV 78.0 - 100.0 fl 84.0  MCHC 30.0 - 36.0 g/dL 32.2  RDW 11.5 - 15.5 % 16.1 (H)  Platelets 150.0 - 400.0 K/uL 221.0  Neutrophils 43.0 - 77.0 % 38.7 (L)  Lymphocytes 12.0 - 46.0 % 44.5  Monocytes Relative 3.0 - 12.0 % 10.7  Eosinophil 0.0 - 5.0 % 5.1 (H)  Basophil 0.0 - 3.0 % 1.0  NEUT# 1.4 - 7.7 K/uL 1.9  Lymphocyte # 0.7 - 4.0 K/uL 2.2  Monocyte # 0.1 - 1.0 K/uL 0.5  Eosinophils Absolute 0.0 - 0.7 K/uL 0.3  Basophils Absolute 0.0 - 0.1 K/uL 0.0  TSH 0.35 - 5.50 uIU/mL 0.83  T4,Free(Direct) 0.60 - 1.60 ng/dL 0.87    Thyroid Ultrasound 01/11/2021  The previously biopsied left thyroid dominant TR 3 nodule measures 5.1 x 3.7 x 2.6 cm, previously 4.4 x 3.4 x 2.5 cm. Correlate with prior pathology.   No new thyroid nodule.  No  adenopathy.   IMPRESSION: Stable to slight increase in size of the dominant left thyroid TR 3 nodule, previously biopsied.   No new thyroid abnormality.    FNA left nodule 11/26/2019   FINAL MICROSCOPIC DIAGNOSIS:  - Consistent with benign follicular nodule (Bethesda category II)   ASSESSMENT / PLAN / RECOMMENDATIONS:   Hyperthyroidism   - Most likely cause is toxic left thyroid nodule -Patient is clinically and biochemically euthyroid - No changes , but she understands that after thyroid surgery she will most likely not require any methimazole  Medications   Continue  Methimazole 5 mg, half a tablet daily      2. Left thyroid nodule    - S/P benign FNA in 11/2019 -There has been an increase in size of the left thyroid nodule 2021 and 2022 but the patient did not meet FNA criteria for repeat - She is interested in surgical intervention , and given the size of the nodule I agree with this -I have put in a new order for repeat ultrasound at [facility] -She will be referred to our local surgeon for evaluation -I did explain to the patient that even with hemithyroidectomy, the patient may need to be on lifelong LT-for replacement depending on her TFTs following the surgery   3.  Prediabetes:  -A1c 6.2% -I have encouraged the patient to continue with weight loss efforts   4.  Tobacco abuse:  -The patient has attempted tobacco cessation in the past but this caused increased weight due to to excessive eating -The patient follows with pulmonology -She has been noted with scattered wheezing on exam today, I did encourage the patient to prioritize tobacco cessation in addition to continuing to monitor dietary intake and working on weight loss    F/U in 4 months    Signed electronically by: Maretta Bees  Nena Jordan, MD  [facility]  [facility] [address] [place], [place] [address] Phone: [phone] FAX: [phone]       CC: Pccm, Ander Gaster, MD No address on file Phone: None  Fax: None   Return to Endocrinology clinic as below: Future Appointments  Date Time Provider [place]  07/10/2022 11:20 AM Theora Gianotti, NP [facility] None  10/11/2022 11:10 AM [place], Melanie Crazier, MD [facility] None

## 2022-05-14 ENCOUNTER — Ambulatory Visit: Payer: BC Managed Care – PPO

## 2022-05-26 ENCOUNTER — Other Ambulatory Visit: Payer: Self-pay | Admitting: Family

## 2022-05-26 DIAGNOSIS — R059 Cough, unspecified: Secondary | ICD-10-CM

## 2022-05-26 DIAGNOSIS — R062 Wheezing: Secondary | ICD-10-CM

## 2022-06-02 ENCOUNTER — Other Ambulatory Visit: Payer: Self-pay | Admitting: Cardiovascular Disease

## 2022-07-09 ENCOUNTER — Emergency Department
Admission: EM | Admit: 2022-07-09 | Discharge: 2022-07-09 | Disposition: A | Discharge status: Home / Self Care | Payer: BC Managed Care – PPO | Attending: Emergency Medicine | Admitting: Emergency Medicine

## 2022-07-09 ENCOUNTER — Other Ambulatory Visit: Payer: Self-pay

## 2022-07-09 ENCOUNTER — Encounter: Payer: Self-pay | Admitting: *Deleted

## 2022-07-09 DIAGNOSIS — I10 Essential (primary) hypertension: Secondary | ICD-10-CM | POA: Diagnosis not present

## 2022-07-09 DIAGNOSIS — R062 Wheezing: Secondary | ICD-10-CM | POA: Diagnosis not present

## 2022-07-09 DIAGNOSIS — J4521 Mild intermittent asthma with (acute) exacerbation: Secondary | ICD-10-CM | POA: Insufficient documentation

## 2022-07-09 MED ORDER — ALBUTEROL SULFATE (2.5 MG/3ML) 0.083% IN NEBU: 2.5000 mg | INHALATION_SOLUTION | Freq: Four times a day (QID) | RESPIRATORY_TRACT | 2 refills | Status: AC | PRN

## 2022-07-09 MED ORDER — PREDNISONE 20 MG PO TABS
60.0000 mg | ORAL_TABLET | Freq: Once | ORAL | Status: AC
  Administered 2022-07-09: 60 mg via ORAL
  Filled 2022-07-09: qty 3

## 2022-07-09 MED ORDER — IPRATROPIUM-ALBUTEROL 0.5-2.5 (3) MG/3ML IN SOLN
6.0000 mL | Freq: Once | RESPIRATORY_TRACT | Status: AC
  Administered 2022-07-09: 6 mL via RESPIRATORY_TRACT
  Filled 2022-07-09: qty 6

## 2022-07-09 MED ORDER — PREDNISONE 20 MG PO TABS: 40.0000 mg | ORAL_TABLET | Freq: Every day | ORAL | 0 refills | Status: AC

## 2022-07-09 MED ORDER — ALBUTEROL SULFATE HFA 108 (90 BASE) MCG/ACT IN AERS: 2.0000 | INHALATION_SPRAY | RESPIRATORY_TRACT | 2 refills | Status: DC | PRN

## 2022-07-09 NOTE — ED Provider Notes (Signed)
[facility] Provider Note    Event Date/Time   First MD Initiated Contact with Patient 07/09/22 1826     (approximate)   History   Wheezing   HPI  Bianca Acosta is a 46 y.o. female with a history of hypertension, paroxysmal SVT, asthma who comes the ED complaining of shortness of breath and wheezing along with nonproductive cough for the past 24 hours.  Feels like asthma exacerbation.  She has been out of in her inhaler so unable to take any rescue medication at home.  No fever.  No chest pain.     Physical Exam   Triage Vital Signs: ED Triage Vitals  Enc Vitals Group     BP 07/09/22 1707 (!) 159/90     Pulse Rate 07/09/22 1706 81     Resp 07/09/22 1706 (!) 22     Temp 07/09/22 1706 98.3 F (36.8 C)     Temp Source 07/09/22 1706 Oral     SpO2 07/09/22 1706 100 %     Weight 07/09/22 1703 265 lb (120.2 kg)     Height 07/09/22 1703 '5\' 3"'$  (1.6 m)     Head Circumference --      Peak Flow --      Pain Score 07/09/22 1703 0     Pain Loc --      Pain Edu? --      Excl. in [place]? --     Most recent vital signs: Vitals:   07/09/22 1706 07/09/22 1707  BP:  (!) 159/90  Pulse: 81   Resp: (!) 22   Temp: 98.3 F (36.8 C)   SpO2: 100%     General: Awake, no distress.  CV:  Good peripheral perfusion.  Regular rate and rhythm Resp:  Normal effort.  Symmetric air movement bilaterally.  Diffuse expiratory wheezing with prolonged expiratory phase, frequent coughing with deep inspiration. Abd:  No distention.  Other:  No lower extremity edema.  Moist oral mucosa   ED Results / Procedures / Treatments   Labs (all labs ordered are listed, but only abnormal results are displayed) Labs Reviewed - No data to display   RADIOLOGY    PROCEDURES:  Procedures   MEDICATIONS ORDERED IN ED: Medications  predniSONE (DELTASONE) tablet 60 mg (60 mg Oral Given 07/09/22 1842)  ipratropium-albuterol (DUONEB) 0.5-2.5 (3) MG/3ML nebulizer solution 6 mL  (6 mLs Nebulization Given 07/09/22 1843)     IMPRESSION / MDM / ASSESSMENT AND PLAN / ED COURSE  I reviewed the triage vital signs and the nursing notes.                             Patient presents with clinically apparent asthma exacerbation.  No focal lung findings, no fever.  She is nontoxic and not septic, doubt pneumonia or pleural effusion pulmonary edema or pulmonary embolism.  Doubt ACS.  Patient feels better after prednisone and DuoNeb,      FINAL CLINICAL IMPRESSION(S) / ED DIAGNOSES   Final diagnoses:  Mild intermittent asthma with exacerbation     Rx / DC Orders   ED Discharge Orders          Ordered    albuterol (PROVENTIL HFA) 108 (90 Base) MCG/ACT inhaler  Every 4 hours PRN        07/09/22 1837    predniSONE (DELTASONE) 20 MG tablet  Daily with breakfast  07/09/22 1837    albuterol (PROVENTIL) (2.5 MG/3ML) 0.083% nebulizer solution  Every 6 hours PRN        07/09/22 1837             Note:  This document was prepared using Dragon voice recognition software and may include unintentional dictation errors.   Carrie Mew, MD 07/09/22 [phone]

## 2022-07-09 NOTE — ED Triage Notes (Signed)
Pt reports wheezing and has asthma.  Pt also has a cough.  No fever.    Pt out of inhaler.  Pt alert

## 2022-07-09 NOTE — ED Notes (Signed)
Pt Dc to home. Dc instructions reviewed with all questions answered. Pt verbalizes understanding. Pt ambulatory out of dept with steady gait 

## 2022-07-10 ENCOUNTER — Ambulatory Visit: Payer: BC Managed Care – PPO | Attending: Nurse Practitioner | Admitting: Nurse Practitioner

## 2022-07-10 ENCOUNTER — Encounter: Payer: Self-pay | Admitting: Nurse Practitioner

## 2022-07-10 VITALS — BP 114/78 | HR 104 | Ht 63.0 in | Wt 253.0 lb

## 2022-07-10 DIAGNOSIS — I471 Supraventricular tachycardia, unspecified: Secondary | ICD-10-CM | POA: Diagnosis not present

## 2022-07-10 DIAGNOSIS — E0590 Thyrotoxicosis, unspecified without thyrotoxic crisis or storm: Secondary | ICD-10-CM | POA: Diagnosis not present

## 2022-07-10 DIAGNOSIS — Z720 Tobacco use: Secondary | ICD-10-CM

## 2022-07-10 DIAGNOSIS — Z6841 Body Mass Index (BMI) 40.0 and over, adult: Secondary | ICD-10-CM

## 2022-07-10 DIAGNOSIS — I10 Essential (primary) hypertension: Secondary | ICD-10-CM

## 2022-07-10 DIAGNOSIS — G4733 Obstructive sleep apnea (adult) (pediatric): Secondary | ICD-10-CM

## 2022-07-10 NOTE — Patient Instructions (Signed)
Medication Instructions:  No changes at this time.   *If you need a refill on your cardiac medications before your next appointment, please call your pharmacy*   Lab Work: None  If you have labs (blood work) drawn today and your tests are completely normal, you will receive your results only by: [place] (if you have MyChart) OR A paper copy in the mail If you have any lab test that is abnormal or we need to change your treatment, we will call you to review the results.   Testing/Procedures: None    Follow-Up: At [facility], you and your health needs are our priority.  As part of our continuing mission to provide you with exceptional heart care, we have created designated Provider Care Teams.  These Care Teams include your primary Cardiologist (physician) and Advanced Practice Providers (APPs -  Physician Assistants and Nurse Practitioners) who all work together to provide you with the care you need, when you need it.   Your next appointment:   6 month(s)  The format for your next appointment:   In Person  Provider:   Kathlyn [place], MD     Important Information About Sugar

## 2022-07-10 NOTE — Progress Notes (Signed)
Office Visit    Patient Name: ZOPHIA MARRONE Date of Encounter: 07/10/2022  Primary Care Provider:  Pccm, Ander Gaster, MD Primary Cardiologist:  Kathlyn Sacramento, MD  Chief Complaint    46 year old female with a history of hypertension, tobacco abuse, obesity, sleep apnea, PSVT, chest pain with coronary calcium score of 0, thyroid nodule, and brain aneurysm status post craniotomy in 2016, presents for follow-up related to PSVT.  Past Medical History    Past Medical History:  Diagnosis Date   Brain aneurysm 06/29/2015   a. s/p craniotomy.   Chest pain    a. 09/2019 Cardiac CT: Ca2+ score = Zero.   Chicken pox    Headache    History of echocardiogram    a. 12/2018 Echo: EF 60-65%. DD. Mild LAE; b. 11/2019 Echo: EF 60-65%, no rwma, nl PASP.    Hypertension    PSVT (paroxysmal supraventricular tachycardia)    a. 08/2019 Zio: Avg HR 84 (NSR). 4 beats WCT (likely SVT w/ aberrancy). 4 SVT episodes, longes 9.9 secs. Rare PACs/PVCs (<1%). Some triggered events = SVT and/or PVCs.   Sleep apnea 10/2018   a. Does not tolerate CPAP.   Thyroid nodule greater than or equal to 1.5 cm in diameter incidentally noted on imaging study    a. 09/2019 CTA Chest: Ill-defined 2.5cm L thyroid nodule - rec f/u U/S; b. 10/2019 Thyroid U/S: 4.4cm L-sided thyroid nodule. FNA rec; c. 11/2019 FNA benign.   Tobacco abuse    Past Surgical History:  Procedure Laterality Date   CRANIOTOMY Right 06/29/2015   Procedure: CRANIOTOMY INTRACRANIAL ANEURYSM CLIPPING;  Surgeon: Kevan Ny Ditty, MD;  Location: [facility];  Service: Neurosurgery;  Laterality: Right;   IR GENERIC HISTORICAL  05/25/2016   IR ANGIO INTRA EXTRACRAN SEL INTERNAL CAROTID BILAT MOD SED 05/25/2016 Consuella Lose, MD [facility]   IR GENERIC HISTORICAL  06/29/2015   IR ANGIO INTRA EXTRACRAN SEL INTERNAL CAROTID BILAT MOD SED 06/29/2015 Consuella Lose, MD [facility]   IR GENERIC HISTORICAL  06/29/2015   IR ANGIO VERTEBRAL SEL  VERTEBRAL UNI L MOD SED 06/29/2015 Consuella Lose, MD [facility]   IR GENERIC HISTORICAL  06/29/2015   IR 3D INDEPENDENT WKST 06/29/2015 Consuella Lose, MD [facility]   UTERINE FIBROID SURGERY  2020    Allergies  Allergies  Allergen Reactions   Amoxicillin    Augmentin [Amoxicillin-Pot Clavulanate] Hives   Ciprofloxacin Itching    Patient states she starts itching from inside or internal.   Clavulanic Acid     History of Present Illness    46 year old female with above complex past medical history including hypertension, tobacco abuse, obesity, sleep apnea, PSVT, chest pain, thyroid nodule, tobacco abuse, and brain aneurysm status postcraniotomy in 2016.  She has had difficult to control hypertension dating back to her 38s.  Echo in May 2020, showed normal LV function with diastolic dysfunction, and mild left atrial enlargement.  [place] 2021, she had an episode of chest pain and also more frequent palpitations.  Cardiac CT for calcium scoring was performed with a calcium score of 0.  Event monitoring showed 4 beats of wide-complex tachycardia felt to be SVT with aberrancy, along with 4 episodes of SVT, the longest lasting 9.9 seconds.  Some triggered events were associate with SVT and or PVCs.  She was later found to be hyperthyroid, and subsequently instantly noted to have a 2.5 cm left thyroid nodule on CT of the chest November 2021.  She has been followed  by endocrinology and is on methimazole.  Around the same time, she was having significant hypokalemia in the setting of chlorthalidone therapy, which was discontinued with subsequent development of weight gain and swelling of her extremities.  She has since been on triamterene HCTZ.  Ms. Masci was last seen in cardiology clinic in May 2023, which time she noted only rare palpitations about 1-2 times per month.  Since her last visit, she has done well.  She has not been experiencing any prolonged palpitations.  Over the course  of the fall, she has been experiencing worsening wheezing and allergies.  She was recently seen in the emergency department and is currently on a prednisone taper.  She is already feeling some better.  She has been trying to watch her diet very closely and has lost some weight since her last visit.  She works 2 jobs and therefore, does not have much time to exercise.  She continues to smoke a half a pack a day but has been cutting back and eating sunflower seeds in the shell instead.  She found a low-sodium option.  She denies chest pain, dyspnea, PND, orthopnea, dizziness, syncope, edema, or early satiety.  Home Medications    Current Outpatient Medications  Medication Sig Dispense Refill   albuterol (PROVENTIL HFA) 108 (90 Base) MCG/ACT inhaler Inhale 2 puffs into the lungs every 4 (four) hours as needed for wheezing or shortness of breath. 1 each 0   albuterol (PROVENTIL HFA) 108 (90 Base) MCG/ACT inhaler Inhale 2 puffs into the lungs every 4 (four) hours as needed for wheezing or shortness of breath. 1 each 2   albuterol (PROVENTIL) (2.5 MG/3ML) 0.083% nebulizer solution Take 3 mLs (2.5 mg total) by nebulization every 6 (six) hours as needed for wheezing or shortness of breath. 75 mL 2   carvedilol (COREG) 12.5 MG tablet Take 1 tablet (12.5 mg total) by mouth 2 (two) times daily. 180 tablet 0   cetirizine (ZYRTEC) 10 MG tablet TAKE 1 TABLET BY MOUTH EVERY DAY 30 tablet 0   cholecalciferol (VITAMIN D3) 25 MCG (1000 UT) tablet Take 1 tablet (1,000 Units total) by mouth daily. 90 tablet 3   methimazole (TAPAZOLE) 5 MG tablet Take 0.5 tablets (2.5 mg total) by mouth daily. 45 tablet 1   predniSONE (DELTASONE) 20 MG tablet Take 2 tablets (40 mg total) by mouth daily with breakfast for 4 days. 8 tablet 0   Probiotic Product (FORTIFY PROBIOTIC WOMENS EX ST) CPDR Take 1 capsule by mouth daily. 30 capsule 2   spironolactone (ALDACTONE) 25 MG tablet TAKE 1 TABLET (25 MG TOTAL) BY MOUTH DAILY. 90 tablet 0    triamterene-hydrochlorothiazide (DYAZIDE) 37.5-25 MG capsule TAKE 1 EACH (1 CAPSULE TOTAL) BY MOUTH DAILY. 90 capsule 0   No current facility-administered medications for this visit.     Review of Systems    Recent dyspnea with wheezing that is improving.  She denies chest pain, palpitations, PND, orthopnea, dizziness, syncope, edema, or early satiety.  All other systems reviewed and are otherwise negative except as noted above.    Physical Exam    VS:  BP 114/78 (BP Location: Left Arm, Patient Position: Sitting, Cuff Size: Normal)   Pulse (!) 104   Ht '5\' 3"'$  (1.6 m)   Wt 253 lb (114.8 kg)   LMP 06/06/2022 (Approximate)   SpO2 96%   BMI 44.82 kg/m  , BMI Body mass index is 44.82 kg/m.     GEN: Well nourished, well developed, in  no acute distress. HEENT: normal. Neck: Supple, no JVD, carotid bruits, or masses. Cardiac: RRR, no murmurs, rubs, or gallops. No clubbing, cyanosis, edema.  Radials/PT 2+ and equal bilaterally.  Respiratory:  Respirations regular and unlabored, coarse breath sounds with diffuse expiratory wheezing. GI: Soft, nontender, nondistended, BS + x 4. MS: no deformity or atrophy. Skin: warm and dry, no rash. Neuro:  Strength and sensation are intact. Psych: Normal affect.  Accessory Clinical Findings    ECG personally reviewed by me today -sinus tachycardia, 104- no acute changes.  Lab Results  Component Value Date   WBC 4.9 04/12/2022   HGB 13.3 04/12/2022   HCT 41.2 04/12/2022   MCV 84.0 04/12/2022   PLT 221.0 04/12/2022   Lab Results  Component Value Date   CREATININE 1.03 04/12/2022   BUN 13 04/12/2022   NA 141 04/12/2022   K 4.3 04/12/2022   CL 106 04/12/2022   CO2 28 04/12/2022   Lab Results  Component Value Date   ALT 20 04/12/2022   AST 17 04/12/2022   ALKPHOS 53 04/12/2022   BILITOT 0.4 04/12/2022   Lab Results  Component Value Date   CHOL 134 05/16/2018   HDL 49 (L) 05/16/2018   LDLCALC 69 05/16/2018   TRIG 81 05/16/2018    CHOLHDL 2.7 05/16/2018    Lab Results  Component Value Date   HGBA1C 6.2 (A) 04/12/2022   Lab Results  Component Value Date   TSH 0.83 04/12/2022    Assessment & Plan    1.  Palpitations/PSVT: Patient with a history of palpitations with monitoring in January 2021 showing brief episodes of PSVT.  She has been managed with carvedilol therapy and overall has been doing well over the past 6 months.  Heart rate mildly elevated today and sinus tachycardia in the setting of recent asthma flare and current prednisone taper.  She remains on methimazole therapy for hyperthyroidism with normal TSH in August.  She continues to follow with endocrinology from that standpoint.  2.  Essential hypertension: Blood pressure looks good today at 114/78.  She remains on beta-blocker, spironolactone, and triamterene-HCTZ.  Stable lab work in August.  3.  Hyperthyroidism: On methimazole and followed by endocrinology.  She expects to have a thyroidectomy prior to next summer and is trying to schedule an appointment with surgery in [place].  4.  Morbid obesity: Long discussion today about diet, exercise, and weight loss goals.  She has been watching her diet very closely and is down in weight from her last visit, though overall, her weight trends have been roughly in the same neighborhood.  She works 2 jobs and in that setting, does not have much time to exercise during the day.  We discussed reasonable exercise goals-30 minutes at least 5 days a week, as well as appropriate dietary choices and caloric restriction.  She notes that starches such as rice and potatoes are her Achilles' heel.  We discussed trying to replace these in her diet with other grains or legumes.    5.  Sleep apnea: Followed by pulmonology.  Uses CPAP.  6.  Asthma: With recent flare requiring ED visit.  Currently on steroids with some improvement.  Discussed the importance of smoking cessation.  7.  Tobacco abuse: Still smoking half a pack a  day.  Discussed the potential value of nicotine replacement plus an oral agent such as Chantix or Wellbutrin.  Patient has a health savings account and will use her money to get nicotine patches.  She is not interested in oral agent at this time.  She definitely wants to quit but is concerned that she will gain weight.  8.  Disposition: 45 mins spent with pt today discussing the above.  Follow-up in 6 months or sooner if necessary.  Murray Hodgkins, NP 07/10/2022, 12:22 PM

## 2022-07-25 ENCOUNTER — Ambulatory Visit (INDEPENDENT_AMBULATORY_CARE_PROVIDER_SITE_OTHER): Payer: BC Managed Care – PPO | Admitting: Internal Medicine

## 2022-07-25 ENCOUNTER — Encounter: Payer: Self-pay | Admitting: Internal Medicine

## 2022-07-25 VITALS — BP 122/70 | HR 112 | Temp 98.0°F | Ht 63.0 in | Wt 253.6 lb

## 2022-07-25 DIAGNOSIS — J441 Chronic obstructive pulmonary disease with (acute) exacerbation: Secondary | ICD-10-CM | POA: Diagnosis not present

## 2022-07-25 DIAGNOSIS — I480 Paroxysmal atrial fibrillation: Secondary | ICD-10-CM

## 2022-07-25 DIAGNOSIS — Z720 Tobacco use: Secondary | ICD-10-CM | POA: Diagnosis not present

## 2022-07-25 DIAGNOSIS — G4733 Obstructive sleep apnea (adult) (pediatric): Secondary | ICD-10-CM

## 2022-07-25 NOTE — Patient Instructions (Addendum)
Please Stop Smoking  Plan for Dentist Evaluation and Device  Plan for repeat Sleep Study with Dental device in place  ALBUTEROL AS NEEDED  Recommend weight loss   Moderate Risk for Lung problems with any type of surgery

## 2022-07-25 NOTE — Progress Notes (Signed)
Name: Bianca Acosta MRN: [id number] DOB: [birth year]     CONSULTATION DATE: 2.14.2020 REFERRING MD : Fletcher Anon    STUDIES:   08/2018  CXR independently reviewed by Me No acute findings No penumonia No edema No effusions  Sleep Study 09/2018 Mild AHI 10   CC Follow up smoking Follow up OSA Follow up ASTHMA/COPD exacerbation   HISTORY OF PRESENT ILLNESS:  Smoking Assessment and Cessation Counseling Upon further questioning, Patient smokes 1 ppd I have advised patient to quit/stop smoking as soon as possible due to high risk for multiple medical problems  Patient is NOT willing to quit smoking  I have advised patient that we can assist and have options of Nicotine replacement therapy. I also advised patient on behavioral therapy and can provide oral medication therapy in conjunction with the other therapies Follow up next Office visit  for assessment of smoking cessation Smoking cessation counseling advised for 15 minutes    Regarding her OSA  Patient is very noncompliant with her CPAP has a diagnosis of sleep apnea with AHI of 10  Intolerant of CPAP  Patient is noncompliant  Patient has been explained the risks multiple times with high risk for cardiac and lung problems along with increased risk of stroke and death Plan for Dentist Evaluation and Device Plan for repeat Sleep Study with Dental device in place    ASTHMA COPD No exacerbation at this time No evidence of heart failure at this time No evidence or signs of infection at this time No respiratory distress No fevers, chills, nausea, vomiting, diarrhea No evidence of lower extremity edema No evidence hemoptysis No maintenance therapy at this time  OBESITY Last OV weight 256--> now 253 Next goal weight 243   PAST MEDICAL HISTORY :   has a past medical history of Brain aneurysm (06/29/2015), Chest pain, Chicken pox, Headache, History of echocardiogram, Hypertension, PSVT (paroxysmal  supraventricular tachycardia), Sleep apnea (10/2018), Thyroid nodule greater than or equal to 1.5 cm in diameter incidentally noted on imaging study, and Tobacco abuse.  has a past surgical history that includes Craniotomy (Right, 06/29/2015); ir generic historical (05/25/2016); ir generic historical (06/29/2015); ir generic historical (06/29/2015); ir generic historical (06/29/2015); and Uterine fibroid surgery (2020). Prior to Admission medications   Medication Sig Start Date End Date Taking? Authorizing Provider  albuterol (PROVENTIL HFA;VENTOLIN HFA) 108 (90 Base) MCG/ACT inhaler Inhale 2 puffs into the lungs every 6 (six) hours as needed for wheezing or shortness of breath. 08/29/18  Yes Guse, Jacquelynn Cree, FNP  bisacodyl (DULCOLAX) 5 MG EC tablet Take 1 tablet (5 mg total) by mouth daily as needed for moderate constipation. 09/26/18  Yes Guse, Jacquelynn Cree, FNP  chlorthalidone (HYGROTON) 25 MG tablet Take 1 tablet (25 mg total) by mouth daily. 09/12/18 12/11/18 Yes Wellington Hampshire, MD  cholecalciferol (VITAMIN D3) 25 MCG (1000 UT) tablet Take 1 tablet (1,000 Units total) by mouth daily. 09/26/18  Yes Guse, Jacquelynn Cree, FNP  nebivolol (BYSTOLIC) 10 MG tablet Take 1 tablet (10 mg total) by mouth daily. 09/26/18  Yes Guse, Jacquelynn Cree, FNP  omeprazole (PRILOSEC) 20 MG capsule Take 1 capsule (20 mg total) by mouth daily. 09/17/18  Yes Guse, Jacquelynn Cree, FNP  potassium chloride SA (K-DUR,KLOR-CON) 20 MEQ tablet Take 1 tablet (20 mEq total) by mouth daily. 09/23/18  Yes Wellington Hampshire, MD  Probiotic Product (FORTIFY PROBIOTIC WOMENS EX ST) CPDR Take 1 capsule by mouth daily. 09/17/18  Yes Guse, Jacquelynn Cree, FNP  spironolactone (ALDACTONE)  25 MG tablet Take 1 tablet (25 mg total) by mouth daily. 09/12/18 12/11/18 Yes Wellington Hampshire, MD   Allergies  Allergen Reactions   Amoxicillin    Augmentin [Amoxicillin-Pot Clavulanate] Hives   Ciprofloxacin Itching    Patient states she starts itching from inside or internal.    Clavulanic Acid      BP 122/70 (BP Location: Left Arm, Cuff Size: Large)   Pulse (!) 112   Temp 98 F (36.7 C) (Temporal)   Ht '5\' 3"'$  (1.6 m)   Wt 253 lb 9.6 oz (115 kg)   LMP 06/06/2022 (Approximate)   SpO2 100%   BMI 44.92 kg/m    Review of Systems: Gen:  Denies  fever, sweats, chills weight loss  HEENT: Denies blurred vision, double vision, ear pain, eye pain, hearing loss, nose bleeds, sore throat Cardiac:  No dizziness, chest pain or heaviness, chest tightness,edema, No JVD Resp:   No cough, -sputum production, -shortness of breath,-wheezing, -hemoptysis,  Other:  All other systems negative    Physical Examination:   General Appearance: No distress  EYES PERRLA, EOM intact.   NECK Supple, No JVD Pulmonary: normal breath sounds, No wheezing.  CardiovascularNormal S1,S2.  No m/r/g.   Abdomen: Benign, Soft, non-tender. ALL OTHER ROS ARE NEGATIVE       ASSESSMENT / PLAN:  46 year old African-American female morbid obesity signs and symptoms of moderate sleep apnea in the setting of intermittent reactive airways disease consistent with asthma /COPD   ASTHMA/COPD Recent dx of exacerbation Albuterol as needed  Patient with sleep apnea however is noncompliant with CPAP Patient is at high risk for arrhythmias Patient is at high risk for progressive heart failure PATIENT AT [place] [facility] for Dentist Evaluation and Device Plan for repeat Sleep Study with Dental device in place   SMOKING CESSATION STRONGLY ADVISED   Obesity -recommend significant weight loss -recommend changing diet  Deconditioned state -Recommend increased daily activity and exercise    Surgical preop assessment Patient is a moderate  risk for postop and Intra-Op complications due to her Lung disease/Smoking/afib/OSA     A. fib with hyper thyroidism Continue therapy follow-up endocrinology Patient with acanthosis nigricans will need assessment for  diabetes   Follow-up cardiology as scheduled  EXPLAINED TO HUSBAND IN DETAIL PREVIOUS OV AND CONVERSATIONS   MEDICATION ADJUSTMENTS/LABS AND TESTS ORDERED: Please Stop Smoking Plan for Dentist Evaluation and Device Plan for repeat Sleep Study with Dental device in place ALBUTEROL AS NEEDED Recommend weight loss Moderate Risk for Lung problems with any type of surgery   CURRENT MEDICATIONS REVIEWED AT LENGTH WITH PATIENT TODAY  Follow-up 6 months   Total time spent 35 minutes  Ailea Rhatigan Patricia Pesa, M.D.  [facility]  Medical Director [place] Director [place] Department

## 2022-07-30 ENCOUNTER — Telehealth: Payer: Self-pay | Admitting: Internal Medicine

## 2022-07-30 DIAGNOSIS — G4733 Obstructive sleep apnea (adult) (pediatric): Secondary | ICD-10-CM

## 2022-07-30 NOTE — Telephone Encounter (Signed)
ATC patient, LVM to return call.  Dr. Mortimer Fries states to send the referral to [facility] [phone]).  Referral sent.  Will await return call.

## 2022-07-30 NOTE — Telephone Encounter (Signed)
Informed patient of referral sent to [facility].

## 2022-08-17 ENCOUNTER — Other Ambulatory Visit: Payer: Self-pay

## 2022-08-17 MED ORDER — CARVEDILOL 12.5 MG PO TABS: 12.5000 mg | ORAL_TABLET | Freq: Two times a day (BID) | ORAL | 1 refills | Status: DC

## 2022-08-21 NOTE — Progress Notes (Deleted)
Pccm, [facility], MD   No chief complaint on file.   HPI:      [name] is a 47 y.o. G4P0040 whose LMP was No LMP recorded., presents today for ***  Past due for annual; no recent mammo?  Patient Active Problem List   Diagnosis Date Noted   No-show for appointment 09/06/2021   Vitamin D insufficiency 08/26/2020   Cigarette nicotine dependence without complication [id number]   Thyroid nodule 10/29/2019   Hyperthyroidism 10/29/2019   URI (upper respiratory infection) 07/19/2019   OSA (obstructive sleep apnea) 12/25/2018   Intramural and subserous leiomyoma of uterus 10/25/2018   Hives 07/23/2016   Right knee pain 05/18/2016   Morbid obesity with BMI of 40.0-44.9, adult ([place]) 04/23/2016   HTN (hypertension) 04/23/2016   Encounter for preventative adult health care exam with abnormal findings 04/23/2016   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    Past Surgical History:  Procedure Laterality Date   CRANIOTOMY Right 06/29/2015   Procedure: CRANIOTOMY INTRACRANIAL ANEURYSM CLIPPING;  Surgeon: Kevan Ny Ditty, MD;  Location: [facility];  Service: Neurosurgery;  Laterality: Right;   IR GENERIC HISTORICAL  05/25/2016   IR ANGIO INTRA EXTRACRAN SEL INTERNAL CAROTID BILAT MOD SED 05/25/2016 Consuella Lose, MD [facility]   IR GENERIC HISTORICAL  06/29/2015   IR ANGIO INTRA EXTRACRAN SEL INTERNAL CAROTID BILAT MOD SED 06/29/2015 Consuella Lose, MD [facility]   IR GENERIC HISTORICAL  06/29/2015   IR ANGIO VERTEBRAL SEL VERTEBRAL UNI L MOD SED 06/29/2015 Consuella Lose, MD [facility]   IR GENERIC HISTORICAL  06/29/2015   IR 3D INDEPENDENT WKST 06/29/2015 Consuella Lose, MD [facility]   UTERINE FIBROID SURGERY  2020    Family History  Problem Relation Age of Onset   Hypertension Mother    Hypertension Father    Hypertension Maternal Aunt    Hypertension Maternal Uncle     Hypertension Paternal Aunt    Hypertension Paternal Uncle    Hypertension Maternal Grandmother    Hypertension Maternal Grandfather    Hypertension Paternal Grandmother    Hypertension Paternal Grandfather     Social History   Socioeconomic History   Marital status: Single    Spouse name: Not on file   Number of children: Not on file   Years of education: Not on file   Highest education level: Not on file  Occupational History   Not on file  Tobacco Use   Smoking status: Every Day    Packs/day: 1.00    Years: 20.00    Total pack years: 20.00    Types: Cigarettes    Start date: 1997   Smokeless tobacco: Never  Vaping Use   Vaping Use: Never used  Substance and Sexual Activity   Alcohol use: Yes    Comment: ocassionally   Drug use: No   Sexual activity: Yes    Birth control/protection: None  Other Topics Concern   Not on file  Social History Narrative   Not on file   Social Determinants of Health   Financial Resource Strain: Not on file  Food Insecurity: Not on file  Transportation Needs: Not on file  Physical Activity: Not on file  Stress: Not on file  Social Connections: Not on file  Intimate Partner Violence: Not on file    Outpatient Medications Prior to Visit  Medication Sig Dispense Refill   albuterol (PROVENTIL HFA) 108 (90 Base) MCG/ACT inhaler  Inhale 2 puffs into the lungs every 4 (four) hours as needed for wheezing or shortness of breath. 1 each 2   albuterol (PROVENTIL) (2.5 MG/3ML) 0.083% nebulizer solution Take 3 mLs (2.5 mg total) by nebulization every 6 (six) hours as needed for wheezing or shortness of breath. 75 mL 2   carvedilol (COREG) 12.5 MG tablet Take 1 tablet (12.5 mg total) by mouth 2 (two) times daily. 180 tablet 1   cetirizine (ZYRTEC) 10 MG tablet TAKE 1 TABLET BY MOUTH EVERY DAY 30 tablet 0   cholecalciferol (VITAMIN D3) 25 MCG (1000 UT) tablet Take 1 tablet (1,000 Units total) by mouth daily. 90 tablet 3   methimazole (TAPAZOLE) 5  MG tablet Take 0.5 tablets (2.5 mg total) by mouth daily. 45 tablet 1   Probiotic Product (FORTIFY PROBIOTIC WOMENS EX ST) CPDR Take 1 capsule by mouth daily. 30 capsule 2   spironolactone (ALDACTONE) 25 MG tablet TAKE 1 TABLET (25 MG TOTAL) BY MOUTH DAILY. 90 tablet 0   triamterene-hydrochlorothiazide (DYAZIDE) 37.5-25 MG capsule TAKE 1 EACH (1 CAPSULE TOTAL) BY MOUTH DAILY. 90 capsule 0   No facility-administered medications prior to visit.      ROS:  Review of Systems BREAST: No symptoms   OBJECTIVE:   Vitals:  There were no vitals taken for this visit.  Physical Exam  Results: No results found for this or any previous visit (from the past 24 hour(s)).   Assessment/Plan: No diagnosis found.    No orders of the defined types were placed in this encounter.     No follow-ups on file.  Massey Ruhland B. Brigid Vandekamp, PA-C 08/21/2022 2:02 PM

## 2022-08-23 ENCOUNTER — Encounter: Payer: Self-pay | Admitting: Obstetrics and Gynecology

## 2022-08-23 ENCOUNTER — Ambulatory Visit (INDEPENDENT_AMBULATORY_CARE_PROVIDER_SITE_OTHER): Payer: BC Managed Care – PPO

## 2022-08-23 ENCOUNTER — Ambulatory Visit: Payer: BC Managed Care – PPO | Admitting: Obstetrics and Gynecology

## 2022-08-23 ENCOUNTER — Other Ambulatory Visit: Payer: Self-pay | Admitting: Obstetrics and Gynecology

## 2022-08-23 ENCOUNTER — Other Ambulatory Visit: Payer: Self-pay

## 2022-08-23 DIAGNOSIS — R300 Dysuria: Secondary | ICD-10-CM

## 2022-08-23 DIAGNOSIS — N3001 Acute cystitis with hematuria: Secondary | ICD-10-CM

## 2022-08-23 LAB — POCT URINALYSIS DIPSTICK
Bilirubin, UA: NEGATIVE
Glucose, UA: NEGATIVE
Ketones, UA: NEGATIVE
Protein, UA: NEGATIVE
Spec Grav, UA: 1.015 (ref 1.010–1.025)
Urobilinogen, UA: NEGATIVE E.U./dL — AB
pH, UA: 7 (ref 5.0–8.0)

## 2022-08-23 MED ORDER — ERYTHROMYCIN BASE 500 MG PO TABS: 500.0000 mg | ORAL_TABLET | Freq: Four times a day (QID) | ORAL | 0 refills | Status: AC

## 2022-08-23 MED ORDER — SULFAMETHOXAZOLE-TRIMETHOPRIM 800-160 MG PO TABS: 1.0000 | ORAL_TABLET | Freq: Two times a day (BID) | ORAL | 1 refills | Status: DC

## 2022-08-23 NOTE — Addendum Note (Signed)
Addended by: Brien Few on: 08/23/2022 02:13 PM   Modules accepted: Orders

## 2022-08-23 NOTE — Progress Notes (Signed)
Sending in a different antibiotic after speaking with ABC regarding protocol in the nurse book. Sending in Bactrim and we will send in something different if needed once the UC comes back.

## 2022-08-23 NOTE — Progress Notes (Signed)
SUBJECTIVE: Bianca Acosta is a 47 y.o. female who complains of urinary frequency, urgency and dysuria x 4-5 days, without flank pain, fever, chills, or abnormal vaginal discharge or bleeding.   OBJECTIVE: Appears well, in no apparent distress.  Vital signs are normal. The abdomen is soft without tenderness, guarding, mass, rebound or organomegaly. No CVA tenderness or inguinal adenopathy noted. Urine dipstick shows positive leukocytes, blood and nitrates.   ASSESSMENT: UTI uncomplicated without evidence of pyelonephritis  PLAN: Treatment per orders - also push fluids, may use Pyridium OTC prn. Call or return to clinic prn if these symptoms worsen or fail to improve as anticipated. Sending in antibiotic and will let patient know if we need to change it up when she UC comes back.

## 2022-08-24 ENCOUNTER — Telehealth: Payer: Self-pay

## 2022-08-24 NOTE — Telephone Encounter (Signed)
I reached out to Bianca Acosta as I received a fax from the after hour nurse line where she had called in after hours about her UTI medications being expensive.  I called and left a voicemail, as I see two medications we're called in.

## 2022-08-26 LAB — URINE CULTURE

## 2022-09-20 ENCOUNTER — Other Ambulatory Visit: Payer: Self-pay | Admitting: Cardiovascular Disease

## 2022-09-25 ENCOUNTER — Ambulatory Visit: Payer: BC Managed Care – PPO | Admitting: Obstetrics and Gynecology

## 2022-10-02 DIAGNOSIS — E041 Nontoxic single thyroid nodule: Secondary | ICD-10-CM | POA: Diagnosis not present

## 2022-10-02 DIAGNOSIS — E0590 Thyrotoxicosis, unspecified without thyrotoxic crisis or storm: Secondary | ICD-10-CM | POA: Diagnosis not present

## 2022-10-03 ENCOUNTER — Telehealth: Payer: Self-pay | Admitting: *Deleted

## 2022-10-03 ENCOUNTER — Encounter: Payer: Self-pay | Admitting: Internal Medicine

## 2022-10-03 ENCOUNTER — Other Ambulatory Visit: Payer: Self-pay | Admitting: Internal Medicine

## 2022-10-03 ENCOUNTER — Telehealth: Payer: Self-pay | Admitting: Cardiovascular Disease

## 2022-10-03 DIAGNOSIS — E0590 Thyrotoxicosis, unspecified without thyrotoxic crisis or storm: Secondary | ICD-10-CM

## 2022-10-03 NOTE — Telephone Encounter (Signed)
Pt states she is returning call

## 2022-10-03 NOTE — Telephone Encounter (Signed)
I s/w the pt and she has been scheduled for tele pre op appt 10/18/22 @ 10:20. Med rec and consent are done.      Patient Consent for Virtual Visit        Bianca Acosta has provided verbal consent on 10/03/2022 for a virtual visit (video or telephone).   CONSENT FOR VIRTUAL VISIT FOR:  Bianca Acosta  By participating in this virtual visit I agree to the following:  I hereby voluntarily request, consent and authorize [facility] and its employed or contracted physicians, physician assistants, nurse practitioners or other licensed health care professionals (the Practitioner), to provide me with telemedicine health care services (the "Services") as deemed necessary by the treating Practitioner. I acknowledge and consent to receive the Services by the Practitioner via telemedicine. I understand that the telemedicine visit will involve communicating with the Practitioner through live audiovisual communication technology and the disclosure of certain medical information by electronic transmission. I acknowledge that I have been given the opportunity to request an in-person assessment or other available alternative prior to the telemedicine visit and am voluntarily participating in the telemedicine visit.  I understand that I have the right to withhold or withdraw my consent to the use of telemedicine in the course of my care at any time, without affecting my right to future care or treatment, and that the Practitioner or I may terminate the telemedicine visit at any time. I understand that I have the right to inspect all information obtained and/or recorded in the course of the telemedicine visit and may receive copies of available information for a reasonable fee.  I understand that some of the potential risks of receiving the Services via telemedicine include:  Delay or interruption in medical evaluation due to technological equipment failure or disruption; Information transmitted may  not be sufficient (e.g. poor resolution of images) to allow for appropriate medical decision making by the Practitioner; and/or  In rare instances, security protocols could fail, causing a breach of personal health information.  Furthermore, I acknowledge that it is my responsibility to provide information about my medical history, conditions and care that is complete and accurate to the best of my ability. I acknowledge that Practitioner's advice, recommendations, and/or decision may be based on factors not within their control, such as incomplete or inaccurate data provided by me or distortions of diagnostic images or specimens that may result from electronic transmissions. I understand that the practice of medicine is not an exact science and that Practitioner makes no warranties or guarantees regarding treatment outcomes. I acknowledge that a copy of this consent can be made available to me via my patient portal ([facility]), or I can request a printed copy by calling the office of [facility].    I understand that my insurance will be billed for this visit.   I have read or had this consent read to me. I understand the contents of this consent, which adequately explains the benefits and risks of the Services being provided via telemedicine.  I have been provided ample opportunity to ask questions regarding this consent and the Services and have had my questions answered to my satisfaction. I give my informed consent for the services to be provided through the use of telemedicine in my medical care   3

## 2022-10-03 NOTE — Telephone Encounter (Signed)
I s/w the pt and she has been scheduled for tele pre op appt 10/18/22 @ 10:20. Med rec and consent are done.

## 2022-10-03 NOTE — Telephone Encounter (Signed)
   Name: Bianca Acosta  DOB: 1975-10-29  MRN: [id number]  Primary Cardiologist: Kathlyn Sacramento, MD   Preoperative team, please contact this patient and set up a phone call appointment for further preoperative risk assessment. Please obtain consent and complete medication review. Thank you for your help.   Mayra Reel, NP 10/03/2022, 1:13 PM [place]

## 2022-10-03 NOTE — Telephone Encounter (Signed)
   Pre-operative Risk Assessment    Patient Name: Bianca Acosta  DOB: [birth year] MRN: [id number]      Request for Surgical Clearance    Procedure:   THYROID SURGERY  Date of Surgery:  Clearance TBD                               Surgeon:  DR Armandina Gemma Surgeon's Group or Practice Name:  [facility] Phone number:  [phone] Fax number:  [phone]  Type of Clearance Requested:   - Medical    Type of Anesthesia:  General    Additional requests/questions:    [name]   10/03/2022, 1:04 PM

## 2022-10-05 NOTE — Telephone Encounter (Signed)
error 

## 2022-10-11 ENCOUNTER — Ambulatory Visit: Payer: BC Managed Care – PPO | Admitting: Internal Medicine

## 2022-10-17 NOTE — Progress Notes (Signed)
Virtual Visit via Telephone Note   Because of Bianca Acosta's co-morbid illnesses, she is at least at moderate risk for complications without adequate follow up.  This format is felt to be most appropriate for this patient at this time.  The patient did not have access to video technology/had technical difficulties with video requiring transitioning to audio format only (telephone).  All issues noted in this document were discussed and addressed.  No physical exam could be performed with this format.  Please refer to the patient's chart for her consent to telehealth for [facility].  Evaluation Performed:  Preoperative cardiovascular risk assessment _____________   Date:  10/17/2022   Patient ID:  Bianca Acosta, DOB [birth year], MRN [id number] Patient Location:  Home Provider location:   Office  Primary Care Provider:  Pccm, Ander Gaster, MD Primary Cardiologist:  Kathlyn Sacramento, MD  Chief Complaint / Patient Profile   47 y.o. y/o female with a h/o hypertension, tobacco abuse, obesity, sleep apnea, PSVT, chest pain with coronary calcium score of 0, thyroid nodule, brain aneurysm s/p craniotomy 2016 who is pending thyroid surgery and presents today for telephonic preoperative cardiovascular risk assessment.  History of Present Illness    Bianca Acosta is a 47 y.o. female who presents via audio/video conferencing for a telehealth visit today.  Pt was last seen in cardiology clinic on 07/10/22 by Ignacia Bayley, NP.  At that time Bianca Acosta was doing well.  The patient is now pending procedure as outlined above. Since her last visit, she denies chest pain, shortness of breath, lower extremity edema, fatigue, palpitations, melena, hematuria, hemoptysis, diaphoresis, weakness, presyncope, syncope, orthopnea, and PND. She remains active and does not have any concerning cardiac symptoms.   Past Medical History    Past Medical History:  Diagnosis Date   Brain  aneurysm 06/29/2015   a. s/p craniotomy.   Chest pain    a. 09/2019 Cardiac CT: Ca2+ score = Zero.   Chicken pox    Headache    History of echocardiogram    a. 12/2018 Echo: EF 60-65%. DD. Mild LAE; b. 11/2019 Echo: EF 60-65%, no rwma, nl PASP.    Hypertension    PSVT (paroxysmal supraventricular tachycardia)    a. 08/2019 Zio: Avg HR 84 (NSR). 4 beats WCT (likely SVT w/ aberrancy). 4 SVT episodes, longes 9.9 secs. Rare PACs/PVCs (<1%). Some triggered events = SVT and/or PVCs.   Sleep apnea 10/2018   a. Does not tolerate CPAP.   Thyroid nodule greater than or equal to 1.5 cm in diameter incidentally noted on imaging study    a. 09/2019 CTA Chest: Ill-defined 2.5cm L thyroid nodule - rec f/u U/S; b. 10/2019 Thyroid U/S: 4.4cm L-sided thyroid nodule. FNA rec; c. 11/2019 FNA benign.   Tobacco abuse    Past Surgical History:  Procedure Laterality Date   CRANIOTOMY Right 06/29/2015   Procedure: CRANIOTOMY INTRACRANIAL ANEURYSM CLIPPING;  Surgeon: Kevan Ny Ditty, MD;  Location: [facility];  Service: Neurosurgery;  Laterality: Right;   IR GENERIC HISTORICAL  05/25/2016   IR ANGIO INTRA EXTRACRAN SEL INTERNAL CAROTID BILAT MOD SED 05/25/2016 Consuella Lose, MD [facility]   IR GENERIC HISTORICAL  06/29/2015   IR ANGIO INTRA EXTRACRAN SEL INTERNAL CAROTID BILAT MOD SED 06/29/2015 Consuella Lose, MD [facility]   IR GENERIC HISTORICAL  06/29/2015   IR ANGIO VERTEBRAL SEL VERTEBRAL UNI L MOD SED 06/29/2015 Consuella Lose, MD [facility]   IR GENERIC HISTORICAL  06/29/2015  IR 3D INDEPENDENT WKST 06/29/2015 Consuella Lose, MD [facility]   UTERINE FIBROID SURGERY  2020    Allergies  Allergies  Allergen Reactions   Amoxicillin    Augmentin [Amoxicillin-Pot Clavulanate] Hives   Ciprofloxacin Itching    Patient states she starts itching from inside or internal.   Clavulanic Acid    Macrobid [Nitrofurantoin] Hives    Home Medications    Prior to Admission  medications   Medication Sig Start Date End Date Taking? Authorizing Provider  albuterol (PROVENTIL HFA) 108 (90 Base) MCG/ACT inhaler Inhale 2 puffs into the lungs every 4 (four) hours as needed for wheezing or shortness of breath. 07/09/22   Carrie Mew, MD  albuterol (PROVENTIL) (2.5 MG/3ML) 0.083% nebulizer solution Take 3 mLs (2.5 mg total) by nebulization every 6 (six) hours as needed for wheezing or shortness of breath. 07/09/22 08/08/22  Carrie Mew, MD  carvedilol (COREG) 12.5 MG tablet Take 1 tablet (12.5 mg total) by mouth 2 (two) times daily. 08/17/22   Wellington Hampshire, MD  cetirizine (ZYRTEC) 10 MG tablet TAKE 1 TABLET BY MOUTH EVERY DAY Patient not taking: Reported on 10/03/2022 02/22/20   Marval Regal, NP  cholecalciferol (VITAMIN D3) 25 MCG (1000 UT) tablet Take 1 tablet (1,000 Units total) by mouth daily. Patient not taking: Reported on 10/03/2022 09/26/18   Jodelle Green, FNP  methimazole (TAPAZOLE) 5 MG tablet Take 0.5 tablets (2.5 mg total) by mouth daily. 04/12/22   Shamleffer, Melanie Crazier, MD  Probiotic Product (FORTIFY PROBIOTIC WOMENS EX ST) CPDR Take 1 capsule by mouth daily. Patient not taking: Reported on 10/03/2022 09/17/18   Jodelle Green, FNP  spironolactone (ALDACTONE) 25 MG tablet TAKE 1 TABLET (25 MG TOTAL) BY MOUTH DAILY. 09/21/22   Wellington Hampshire, MD  sulfamethoxazole-trimethoprim (BACTRIM DS) 800-160 MG tablet Take 1 tablet by mouth 2 (two) times daily. Patient not taking: Reported on 10/03/2022 0000000   Copland, Deirdre Evener, PA-C  triamterene-hydrochlorothiazide (DYAZIDE) 37.5-25 MG capsule TAKE 1 EACH (1 CAPSULE TOTAL) BY MOUTH DAILY. 06/04/22   Wellington Hampshire, MD    Physical Exam    Vital Signs:  Bianca Acosta does not have vital signs available for review today.  Given telephonic nature of communication, physical exam is limited. AAOx3. NAD. Normal affect.  Speech and respirations are unlabored.  Accessory Clinical Findings     None  Assessment & Plan    1.  Preoperative Cardiovascular Risk Assessment: The patient is doing well from a cardiac perspective. Therefore, based on ACC/AHA guidelines, the patient would be at acceptable risk for the planned procedure without further cardiovascular testing. According to the Revised Cardiac Risk Index (RCRI), her Perioperative Risk of Major Cardiac Event is (%): 0.4. Her Functional Capacity in METs is: 8.23 according to the [facility] Activity Status Index (DASI).  The patient was advised that if she develops new symptoms prior to surgery to contact our office to arrange for a follow-up visit, and she verbalized understanding.  No request to hold cardiac medications.   A copy of this note will be routed to requesting surgeon.  Time:   Today, I have spent 5 minutes with the patient with telehealth technology discussing medical history, symptoms, and management plan.    Emmaline Life, NP-C  10/18/2022, 10:15 AM [address], [address] Office [phone] Fax [phone]

## 2022-10-18 ENCOUNTER — Encounter: Payer: Self-pay | Admitting: Nurse Practitioner

## 2022-10-18 ENCOUNTER — Ambulatory Visit: Payer: BC Managed Care – PPO | Admitting: Internal Medicine

## 2022-10-18 ENCOUNTER — Telehealth: Payer: Self-pay

## 2022-10-18 ENCOUNTER — Ambulatory Visit: Payer: BC Managed Care – PPO | Attending: Cardiovascular Disease | Admitting: Nurse Practitioner

## 2022-10-18 DIAGNOSIS — Z01810 Encounter for preprocedural cardiovascular examination: Secondary | ICD-10-CM | POA: Diagnosis not present

## 2022-10-18 NOTE — Telephone Encounter (Addendum)
Received surgical clearance form form [facility]. Patient is scheduled for thyroid surgery.  Patient last seen 07/25/2022. Office protocol is that we have to see patient within 60 days of clearance. Dr. Zoila Shutter first available is not until May. No availability with NP's at [facility] office.  Form has been placed in Dr. Zoila Shutter folder.   Dr. Mortimer Fries, please advise.  Would you like to double book on 3/18, 3/19 or 3/20?

## 2022-10-23 NOTE — Telephone Encounter (Signed)
Will fax form back once completed.

## 2022-10-24 ENCOUNTER — Encounter
Admission: RE | Admit: 2022-10-24 | Discharge: 2022-10-24 | Disposition: A | Discharge status: Home / Self Care | Payer: BC Managed Care – PPO | Source: Ambulatory Visit | Attending: Internal Medicine | Admitting: Internal Medicine

## 2022-10-24 DIAGNOSIS — E0590 Thyrotoxicosis, unspecified without thyrotoxic crisis or storm: Secondary | ICD-10-CM | POA: Diagnosis not present

## 2022-10-24 MED ORDER — SODIUM IODIDE I-123 7.4 MBQ CAPS: 451.7000 | ORAL_CAPSULE | Freq: Once | ORAL | Status: DC

## 2022-10-25 ENCOUNTER — Encounter
Admission: RE | Admit: 2022-10-25 | Discharge: 2022-10-25 | Disposition: A | Discharge status: Home / Self Care | Payer: BC Managed Care – PPO | Source: Ambulatory Visit | Attending: Internal Medicine | Admitting: Internal Medicine

## 2022-10-25 DIAGNOSIS — E0590 Thyrotoxicosis, unspecified without thyrotoxic crisis or storm: Secondary | ICD-10-CM | POA: Diagnosis not present

## 2022-10-26 NOTE — Telephone Encounter (Signed)
Dr. Mortimer Fries will be in office 10/29/2022. Will complete form at that time.

## 2022-10-29 ENCOUNTER — Ambulatory Visit: Payer: BC Managed Care – PPO | Admitting: Internal Medicine

## 2022-10-29 NOTE — Progress Notes (Deleted)
Name: Bianca Acosta  MRN/ DOB: [id number], 01-Dec-1975    Age/ Sex: 47 y.o., female     PCP: Pccm, [facility]-, MD   Reason for Endocrinology Evaluation: Hyperthyroidism     Initial Endocrinology Clinic Visit: 10/29/2019    PATIENT IDENTIFIER: Bianca Acosta is a 47 y.o., female with a past medical history of HTN, brain aneurysm, paroxysmal SVT and OSA. She has followed with [facility] since 10/29/2019 for consultative assistance with management of her hyperthyroidism.   HISTORICAL SUMMARY:   Patient has been noted with hyperthyroidism in 09/2019 during evaluation of resistant hypertension.  Her TSH was 0.270 uIU/mL , with an elevated free T4 at 1.25 ng/dL patient was also noted to have an incidental left thyroid nodule on CT scan which was ordered for evaluation of shortness of breath.     Methimazole was started 09/2019   TRAB was undetectable    A thyroid ultrasound 11/09/2019 showed a left inferior 4.4 cm nodule meeting FNA criteria which showed benign follicular nodule (Bethesda category II) on 11/26/2019.    The pt opted to hold off on uptake and scan at the time, but after seeing the surgeon for thyroidectomy, uptake and scan was requested by the surgeon to determine the plan of action.   Thyroid uptake and scan 10/24/2022 showed enlarged left thyroid lobe versus right secondary to a large cold nodule in the mid inferior pole, pyramidal lobe noted, 24-hour uptake normal at 28.3% (10-30 %)    No history of amiodarone use.   No FH of thyroid disease    SUBJECTIVE:    Today (10/29/2022):  Bianca Acosta is here for a follow up on toxic thyroid nodule.    Weight has been stable  Has occasional neck swelling , she is interested in surgical intervention She is motivated to lead a healthy lifestyle, she is currently focusing on weight loss and adjusting her diet accordingly, unfortunately she continues to smoke Denies abdominal pain or  vomiting Denies palpitations  Denies tremors   Patient with history of prediabetes A1c 6.0% in 2021, concerned about developing diabetes  Methimazole 5 mg, half a tablet daily     HISTORY:  Past Medical History:  Past Medical History:  Diagnosis Date   Brain aneurysm 06/29/2015   a. s/p craniotomy.   Chest pain    a. 09/2019 Cardiac CT: Ca2+ score = Zero.   Chicken pox    Headache    History of echocardiogram    a. 12/2018 Echo: EF 60-65%. DD. Mild LAE; b. 11/2019 Echo: EF 60-65%, no rwma, nl PASP.    Hypertension    PSVT (paroxysmal supraventricular tachycardia)    a. 08/2019 Zio: Avg HR 84 (NSR). 4 beats WCT (likely SVT w/ aberrancy). 4 SVT episodes, longes 9.9 secs. Rare PACs/PVCs (<1%). Some triggered events = SVT and/or PVCs.   Sleep apnea 10/2018   a. Does not tolerate CPAP.   Thyroid nodule greater than or equal to 1.5 cm in diameter incidentally noted on imaging study    a. 09/2019 CTA Chest: Ill-defined 2.5cm L thyroid nodule - rec f/u U/S; b. 10/2019 Thyroid U/S: 4.4cm L-sided thyroid nodule. FNA rec; c. 11/2019 FNA benign.   Tobacco abuse    Past Surgical History:  Past Surgical History:  Procedure Laterality Date   CRANIOTOMY Right 06/29/2015   Procedure: CRANIOTOMY INTRACRANIAL ANEURYSM CLIPPING;  Surgeon: Kevan Ny Ditty, MD;  Location: [facility];  Service: Neurosurgery;  Laterality: Right;   IR GENERIC  HISTORICAL  05/25/2016   IR ANGIO INTRA EXTRACRAN SEL INTERNAL CAROTID BILAT MOD SED 05/25/2016 Consuella Lose, MD [facility]   IR GENERIC HISTORICAL  06/29/2015   IR ANGIO INTRA EXTRACRAN SEL INTERNAL CAROTID BILAT MOD SED 06/29/2015 Consuella Lose, MD [facility]   IR GENERIC HISTORICAL  06/29/2015   IR ANGIO VERTEBRAL SEL VERTEBRAL UNI L MOD SED 06/29/2015 Consuella Lose, MD [facility]   IR GENERIC HISTORICAL  06/29/2015   IR 3D INDEPENDENT WKST 06/29/2015 Consuella Lose, MD [facility]   UTERINE FIBROID SURGERY  2020   Social  History:  reports that she has been smoking cigarettes. She started smoking about 27 years ago. She has a 20.00 pack-year smoking history. She has never used smokeless tobacco. She reports current alcohol use. She reports that she does not use drugs. Family History:  Family History  Problem Relation Age of Onset   Hypertension Mother    Hypertension Father    Hypertension Maternal Aunt    Hypertension Maternal Uncle    Hypertension Paternal Aunt    Hypertension Paternal Uncle    Hypertension Maternal Grandmother    Hypertension Maternal Grandfather    Hypertension Paternal Grandmother    Hypertension Paternal Grandfather      HOME MEDICATIONS: Allergies as of 10/29/2022       Reactions   Amoxicillin    Augmentin [amoxicillin-pot Clavulanate] Hives   Ciprofloxacin Itching   Patient states she starts itching from inside or internal.   Clavulanic Acid    Macrobid [nitrofurantoin] Hives        Medication List        Accurate as of October 29, 2022  7:41 AM. If you have any questions, ask your nurse or doctor.          albuterol 108 (90 Base) MCG/ACT inhaler Commonly known as: Proventil HFA Inhale 2 puffs into the lungs every 4 (four) hours as needed for wheezing or shortness of breath.   albuterol (2.5 MG/3ML) 0.083% nebulizer solution Commonly known as: PROVENTIL Take 3 mLs (2.5 mg total) by nebulization every 6 (six) hours as needed for wheezing or shortness of breath.   carvedilol 12.5 MG tablet Commonly known as: COREG Take 1 tablet (12.5 mg total) by mouth 2 (two) times daily.   cetirizine 10 MG tablet Commonly known as: ZYRTEC TAKE 1 TABLET BY MOUTH EVERY DAY   cholecalciferol 25 MCG (1000 UNIT) tablet Commonly known as: VITAMIN D3 Take 1 tablet (1,000 Units total) by mouth daily.   Fortify Probiotic Womens Ex St Cpdr Take 1 capsule by mouth daily.   methimazole 5 MG tablet Commonly known as: TAPAZOLE Take 0.5 tablets (2.5 mg total) by mouth daily.    spironolactone 25 MG tablet Commonly known as: ALDACTONE TAKE 1 TABLET (25 MG TOTAL) BY MOUTH DAILY.   sulfamethoxazole-trimethoprim 800-160 MG tablet Commonly known as: BACTRIM DS Take 1 tablet by mouth 2 (two) times daily.   triamterene-hydrochlorothiazide 37.5-25 MG capsule Commonly known as: DYAZIDE TAKE 1 EACH (1 CAPSULE TOTAL) BY MOUTH DAILY.          OBJECTIVE:   PHYSICAL EXAM: VS: LMP 10/12/2022    EXAM: General: Pt appears well and is in NAD  Neck: General: Supple without adenopathy. Thyroid: Left thyroid nodule appreciated   Lungs: Scattered wheezing  Heart: Auscultation: RRR.  Abdomen: Normoactive bowel sounds, soft, nontender, without masses or organomegaly palpable  Extremities:  BL LE: No pretibial edema normal ROM and strength.  Mental Status: Judgment, insight: Intact  Orientation: Oriented to time, place, and person Mood and affect: No depression, anxiety, or agitation     DATA REVIEWED:  Latest Reference Range & Units 04/12/22 11:28  Sodium 135 - 145 mEq/L 141  Potassium 3.5 - 5.1 mEq/L 4.3  Chloride 96 - 112 mEq/L 106  CO2 19 - 32 mEq/L 28  Glucose 70 - 99 mg/dL 106 (H)  BUN 6 - 23 mg/dL 13  Creatinine 0.40 - 1.20 mg/dL 1.03  Calcium 8.4 - 10.5 mg/dL 9.5  Alkaline Phosphatase 39 - 117 U/L 53  Albumin 3.5 - 5.2 g/dL 4.0  AST 0 - 37 U/L 17  ALT 0 - 35 U/L 20  Total Protein 6.0 - 8.3 g/dL 7.3  Total Bilirubin 0.2 - 1.2 mg/dL 0.4  GFR >60.00 mL/min 65.54  WBC 4.0 - 10.5 K/uL 4.9  RBC 3.87 - 5.11 Mil/uL 4.90  Hemoglobin 12.0 - 15.0 g/dL 13.3  HCT 36.0 - 46.0 % 41.2  MCV 78.0 - 100.0 fl 84.0  MCHC 30.0 - 36.0 g/dL 32.2  RDW 11.5 - 15.5 % 16.1 (H)  Platelets 150.0 - 400.0 K/uL 221.0  Neutrophils 43.0 - 77.0 % 38.7 (L)  Lymphocytes 12.0 - 46.0 % 44.5  Monocytes Relative 3.0 - 12.0 % 10.7  Eosinophil 0.0 - 5.0 % 5.1 (H)  Basophil 0.0 - 3.0 % 1.0  NEUT# 1.4 - 7.7 K/uL 1.9  Lymphocyte # 0.7 - 4.0 K/uL 2.2  Monocyte # 0.1 - 1.0 K/uL 0.5   Eosinophils Absolute 0.0 - 0.7 K/uL 0.3  Basophils Absolute 0.0 - 0.1 K/uL 0.0  TSH 0.35 - 5.50 uIU/mL 0.83  T4,Free(Direct) 0.60 - 1.60 ng/dL 0.87    Thyroid Ultrasound 01/11/2021  The previously biopsied left thyroid dominant TR 3 nodule measures 5.1 x 3.7 x 2.6 cm, previously 4.4 x 3.4 x 2.5 cm. Correlate with prior pathology.   No new thyroid nodule.  No adenopathy.   IMPRESSION: Stable to slight increase in size of the dominant left thyroid TR 3 nodule, previously biopsied.   No new thyroid abnormality.    FNA left nodule 11/26/2019   FINAL MICROSCOPIC DIAGNOSIS:  - Consistent with benign follicular nodule (Bethesda category II)   ASSESSMENT / PLAN / RECOMMENDATIONS:   Hyperthyroidism   - Most likely cause is toxic left thyroid nodule -Patient is clinically and biochemically euthyroid - No changes , but she understands that after thyroid surgery she will most likely not require any methimazole  Medications   Continue  Methimazole 5 mg, half a tablet daily      2. Left thyroid nodule    - S/P benign FNA in 11/2019 -There has been an increase in size of the left thyroid nodule 2021 and 2022 but the patient did not meet FNA criteria for repeat - She is interested in surgical intervention , and given the size of the nodule I agree with this -I have put in a new order for repeat ultrasound at [facility]   3.  Prediabetes:  -A1c 6.2% -I have encouraged the patient to continue with weight loss efforts     F/U in 4 months    Signed electronically by: Mack Guise, MD  [facility]  [facility] [address] [place], [place] [address] Phone: [phone] FAX: [phone]      CC: Pccm, [facility], MD No address on file Phone: None  Fax: None   Return to Endocrinology clinic as below: Future Appointments  Date Time Provider Department  Center  10/29/2022 11:50 AM Keo Schirmer, Melanie Crazier, MD [facility] None  10/30/2022  0000000 PM Copland, Deirdre Evener, PA-C [facility] None  01/08/2023  2:20 PM Wellington Hampshire, MD [facility] None

## 2022-10-30 ENCOUNTER — Ambulatory Visit: Payer: BC Managed Care – PPO | Admitting: Obstetrics and Gynecology

## 2022-10-30 ENCOUNTER — Encounter: Payer: Self-pay | Admitting: Internal Medicine

## 2022-10-30 ENCOUNTER — Telehealth (INDEPENDENT_AMBULATORY_CARE_PROVIDER_SITE_OTHER): Payer: BC Managed Care – PPO | Admitting: Internal Medicine

## 2022-10-30 VITALS — Ht 63.0 in

## 2022-10-30 DIAGNOSIS — E0590 Thyrotoxicosis, unspecified without thyrotoxic crisis or storm: Secondary | ICD-10-CM

## 2022-10-30 DIAGNOSIS — E041 Nontoxic single thyroid nodule: Secondary | ICD-10-CM

## 2022-10-30 DIAGNOSIS — R7303 Prediabetes: Secondary | ICD-10-CM | POA: Diagnosis not present

## 2022-10-30 NOTE — Telephone Encounter (Signed)
Dr. Kasa, please advise. Thanks °

## 2022-10-30 NOTE — Progress Notes (Unsigned)
Virtual Visit via Video Note  I connected with [name] on 10/30/2022 at  2:40 pm by a video enabled telemedicine application and verified that I am speaking with the correct person using two identifiers.   I discussed the limitations of evaluation and management by telemedicine and the availability of in person appointments. The patient expressed understanding and agreed to proceed.   -Location of the patient : home  -Location of the provider : Office -The names of all persons participating in the telemedicine service : Pt and myself        Name: Bianca Acosta  MRN/ DOB: [id number], 03-Dec-1975    Age/ Sex: 47 y.o., female     PCP: Pccm, [facility], MD   Reason for Endocrinology Evaluation: Hyperthyroidism     Initial Endocrinology Clinic Visit: 10/29/2019    PATIENT IDENTIFIER: Bianca Acosta is a 47 y.o., female with a past medical history of HTN, brain aneurysm, paroxysmal SVT and OSA. She has followed with [facility] since 10/29/2019 for consultative assistance with management of her hyperthyroidism.   HISTORICAL SUMMARY:   Patient has been noted with hyperthyroidism in 09/2019 during evaluation of resistant hypertension.  Her TSH was 0.270 uIU/mL , with an elevated free T4 at 1.25 ng/dL patient was also noted to have an incidental left thyroid nodule on CT scan which was ordered for evaluation of shortness of breath.     Methimazole was started 09/2019   TRAB was undetectable    A thyroid ultrasound 11/09/2019 showed a left inferior 4.4 cm nodule meeting FNA criteria which showed benign follicular nodule (Bethesda category II) on 11/26/2019.    The pt opted to hold off on uptake and scan at the time, but after seeing the surgeon for thyroidectomy, uptake and scan was requested by the surgeon to determine the plan of action.   Thyroid uptake and scan 10/24/2022 showed enlarged left thyroid lobe versus right secondary to a large  cold nodule in the mid inferior pole, pyramidal lobe noted, 24-hour uptake normal at 28.3% (10-30 %)    No history of amiodarone use.   No FH of thyroid disease    SUBJECTIVE:    Today (10/30/2022):  Bianca Acosta is here for a follow up on toxic thyroid nodule.    Weight has been stable  Has occasional neck swelling , she is interested in surgical intervention She is motivated to lead a healthy lifestyle, she is currently focusing on weight loss and adjusting her diet accordingly, unfortunately she continues to smoke Denies abdominal pain or vomiting Denies palpitations  Denies tremors   Patient with history of prediabetes A1c 6.0% in 2021, concerned about developing diabetes  Methimazole 5 mg, half a tablet daily     HISTORY:  Past Medical History:  Past Medical History:  Diagnosis Date   Brain aneurysm 06/29/2015   a. s/p craniotomy.   Chest pain    a. 09/2019 Cardiac CT: Ca2+ score = Zero.   Chicken pox    Headache    History of echocardiogram    a. 12/2018 Echo: EF 60-65%. DD. Mild LAE; b. 11/2019 Echo: EF 60-65%, no rwma, nl PASP.    Hypertension    PSVT (paroxysmal supraventricular tachycardia)    a. 08/2019 Zio: Avg HR 84 (NSR). 4 beats WCT (likely SVT w/ aberrancy). 4 SVT episodes, longes 9.9 secs. Rare PACs/PVCs (<1%). Some triggered events = SVT and/or PVCs.   Sleep apnea 10/2018   a. Does not tolerate CPAP.  Thyroid nodule greater than or equal to 1.5 cm in diameter incidentally noted on imaging study    a. 09/2019 CTA Chest: Ill-defined 2.5cm L thyroid nodule - rec f/u U/S; b. 10/2019 Thyroid U/S: 4.4cm L-sided thyroid nodule. FNA rec; c. 11/2019 FNA benign.   Tobacco abuse    Past Surgical History:  Past Surgical History:  Procedure Laterality Date   CRANIOTOMY Right 06/29/2015   Procedure: CRANIOTOMY INTRACRANIAL ANEURYSM CLIPPING;  Surgeon: Kevan Ny Ditty, MD;  Location: [facility];  Service: Neurosurgery;  Laterality: Right;   IR GENERIC HISTORICAL   05/25/2016   IR ANGIO INTRA EXTRACRAN SEL INTERNAL CAROTID BILAT MOD SED 05/25/2016 Consuella Lose, MD [facility]   IR GENERIC HISTORICAL  06/29/2015   IR ANGIO INTRA EXTRACRAN SEL INTERNAL CAROTID BILAT MOD SED 06/29/2015 Consuella Lose, MD [facility]   IR GENERIC HISTORICAL  06/29/2015   IR ANGIO VERTEBRAL SEL VERTEBRAL UNI L MOD SED 06/29/2015 Consuella Lose, MD [facility]   IR GENERIC HISTORICAL  06/29/2015   IR 3D INDEPENDENT WKST 06/29/2015 Consuella Lose, MD [facility]   UTERINE FIBROID SURGERY  2020   Social History:  reports that she has been smoking cigarettes. She started smoking about 27 years ago. She has a 20.00 pack-year smoking history. She has never used smokeless tobacco. She reports current alcohol use. She reports that she does not use drugs. Family History:  Family History  Problem Relation Age of Onset   Hypertension Mother    Hypertension Father    Hypertension Maternal Aunt    Hypertension Maternal Uncle    Hypertension Paternal Aunt    Hypertension Paternal Uncle    Hypertension Maternal Grandmother    Hypertension Maternal Grandfather    Hypertension Paternal Grandmother    Hypertension Paternal Grandfather      HOME MEDICATIONS: Allergies as of 10/30/2022       Reactions   Amoxicillin    Augmentin [amoxicillin-pot Clavulanate] Hives   Ciprofloxacin Itching   Patient states she starts itching from inside or internal.   Clavulanic Acid    Macrobid [nitrofurantoin] Hives        Medication List        Accurate as of October 30, 2022 10:48 AM. If you have any questions, ask your nurse or doctor.          STOP taking these medications    sulfamethoxazole-trimethoprim 800-160 MG tablet Commonly known as: BACTRIM DS Stopped by: Dorita Sciara, MD       TAKE these medications    albuterol 108 (90 Base) MCG/ACT inhaler Commonly known as: Proventil HFA Inhale 2 puffs into the lungs every 4 (four) hours as  needed for wheezing or shortness of breath.   albuterol (2.5 MG/3ML) 0.083% nebulizer solution Commonly known as: PROVENTIL Take 3 mLs (2.5 mg total) by nebulization every 6 (six) hours as needed for wheezing or shortness of breath.   carvedilol 12.5 MG tablet Commonly known as: COREG Take 1 tablet (12.5 mg total) by mouth 2 (two) times daily.   cetirizine 10 MG tablet Commonly known as: ZYRTEC TAKE 1 TABLET BY MOUTH EVERY DAY   cholecalciferol 25 MCG (1000 UNIT) tablet Commonly known as: VITAMIN D3 Take 1 tablet (1,000 Units total) by mouth daily.   Fortify Probiotic Womens Ex St Cpdr Take 1 capsule by mouth daily.   methimazole 5 MG tablet Commonly known as: TAPAZOLE Take 0.5 tablets (2.5 mg total) by mouth daily.   spironolactone 25 MG tablet Commonly  known as: ALDACTONE TAKE 1 TABLET (25 MG TOTAL) BY MOUTH DAILY.   triamterene-hydrochlorothiazide 37.5-25 MG capsule Commonly known as: DYAZIDE TAKE 1 EACH (1 CAPSULE TOTAL) BY MOUTH DAILY.          OBJECTIVE:   PHYSICAL EXAM: VS: Ht 5\' 3"  (1.6 m)   LMP 10/12/2022   BMI 44.92 kg/m    EXAM: General: Pt appears well and is in NAD     DATA REVIEWED:  Latest Reference Range & Units 04/12/22 11:28  Sodium 135 - 145 mEq/L 141  Potassium 3.5 - 5.1 mEq/L 4.3  Chloride 96 - 112 mEq/L 106  CO2 19 - 32 mEq/L 28  Glucose 70 - 99 mg/dL 106 (H)  BUN 6 - 23 mg/dL 13  Creatinine 0.40 - 1.20 mg/dL 1.03  Calcium 8.4 - 10.5 mg/dL 9.5  Alkaline Phosphatase 39 - 117 U/L 53  Albumin 3.5 - 5.2 g/dL 4.0  AST 0 - 37 U/L 17  ALT 0 - 35 U/L 20  Total Protein 6.0 - 8.3 g/dL 7.3  Total Bilirubin 0.2 - 1.2 mg/dL 0.4  GFR >60.00 mL/min 65.54  WBC 4.0 - 10.5 K/uL 4.9  RBC 3.87 - 5.11 Mil/uL 4.90  Hemoglobin 12.0 - 15.0 g/dL 13.3  HCT 36.0 - 46.0 % 41.2  MCV 78.0 - 100.0 fl 84.0  MCHC 30.0 - 36.0 g/dL 32.2  RDW 11.5 - 15.5 % 16.1 (H)  Platelets 150.0 - 400.0 K/uL 221.0  Neutrophils 43.0 - 77.0 % 38.7 (L)  Lymphocytes 12.0  - 46.0 % 44.5  Monocytes Relative 3.0 - 12.0 % 10.7  Eosinophil 0.0 - 5.0 % 5.1 (H)  Basophil 0.0 - 3.0 % 1.0  NEUT# 1.4 - 7.7 K/uL 1.9  Lymphocyte # 0.7 - 4.0 K/uL 2.2  Monocyte # 0.1 - 1.0 K/uL 0.5  Eosinophils Absolute 0.0 - 0.7 K/uL 0.3  Basophils Absolute 0.0 - 0.1 K/uL 0.0  TSH 0.35 - 5.50 uIU/mL 0.83  T4,Free(Direct) 0.60 - 1.60 ng/dL 0.87    Thyroid Uptake and Scan 10/25/2022 FINDINGS: Enlarged LEFT thyroid lobe versus RIGHT secondary to a large cool to cold nodule at the mid inferior pole LEFT lobe.   Pyramidal lobe noted.   No additional areas of increased or decreased tracer uptake.   4 hour I-123 uptake = 15.3% (normal 5-20%)   24 hour I-123 uptake = 28.3% (normal 10-30%)   IMPRESSION: Normal 4 hour and 24 hour radio iodine uptakes.   Cool to cold nodule at mid inferior LEFT thyroid lobe; this has previously been biopsied, recommend correlation with cytology results.   No additional thyroid nodules identified scintigraphically.   Thyroid Ultrasound 01/11/2021  The previously biopsied left thyroid dominant TR 3 nodule measures 5.1 x 3.7 x 2.6 cm, previously 4.4 x 3.4 x 2.5 cm. Correlate with prior pathology.   No new thyroid nodule.  No adenopathy.   IMPRESSION: Stable to slight increase in size of the dominant left thyroid TR 3 nodule, previously biopsied.   No new thyroid abnormality.    FNA left nodule 11/26/2019   FINAL MICROSCOPIC DIAGNOSIS:  - Consistent with benign follicular nodule (Bethesda category II)   ASSESSMENT / PLAN / RECOMMENDATIONS:   Hyperthyroidism   -Her most recent thyroid uptake and scan shows that the left thyroid nodule is called and hyperactivity is at the thyroid lobe with normal 24 uptake at 28.3% -Patient is clinically euthyroid -Patient has already met with the surgeon and is pending another appointment following thyroid uptake and scan  results to discuss surgical plan -I did explain to the patient that with  hemithyroidectomy there is a chance that she will continue to need methimazole -Patient declines total thyroidectomy as she does not want to be on Synthroid  Medications   Continue  Methimazole 5 mg, half a tablet daily      2. Left thyroid nodule    - S/P benign FNA in 11/2019 -Thyroid uptake and scan 10/2022 showed a cold nodule - She is interested in surgical intervention, but would like to have left hemithyroidectomy -Patient does not want to have total thyroidectomy as she does not want to be on Synthroid for life -She is accepting the fact that she may continue to be on methimazole after the surgery, she is more interested in removing the thyroid nodule   3.  Prediabetes:  -A1c 6.2% -We discussed the importance of avoiding sugar sweetened beverages, limiting carbohydrate intake -She believes that she does well with exercise -We discussed her increased risk of developing diabetes in the future without lifestyle changes    F/U in 4 months    Signed electronically by: Mack Guise, MD  [facility]  [address] [place], [place] [address] Phone: [phone] FAX: [phone]      CC: Pccm, [facility]-, MD No address on file Phone: None  Fax: None   Return to Endocrinology clinic as below: Future Appointments  Date Time Provider [place]  10/30/2022  2:40 PM Falisha Osment, Melanie Crazier, MD [facility] None  01/08/2023  2:20 PM Wellington Hampshire, MD [facility] None

## 2022-10-31 ENCOUNTER — Telehealth: Payer: Self-pay | Admitting: Surgery

## 2022-10-31 NOTE — Telephone Encounter (Signed)
Last OV note with assessment faxed to surgeon.

## 2022-10-31 NOTE — Telephone Encounter (Signed)
     Telephone call to patient to discuss uptake scan results.  Dominant nodule is cool to cold.  Recommend total thyroidectomy if surgery is to be pursued.  At this time, patient wishes to remain on methimazole and consider other options.  Patient to contact me if she wishes to proceed with surgery in the future.  Armandina Gemma, MD [facility] [place] [facility] Office: [phone]

## 2022-12-13 ENCOUNTER — Other Ambulatory Visit: Payer: Self-pay | Admitting: Cardiovascular Disease

## 2022-12-18 ENCOUNTER — Other Ambulatory Visit: Payer: Self-pay | Admitting: Cardiovascular Disease

## 2023-01-08 ENCOUNTER — Ambulatory Visit: Payer: BC Managed Care – PPO | Attending: Cardiovascular Disease | Admitting: Cardiovascular Disease

## 2023-01-08 ENCOUNTER — Encounter: Payer: Self-pay | Admitting: Cardiovascular Disease

## 2023-01-08 NOTE — Progress Notes (Deleted)
Cardiology Office Note   Date:  01/08/2023   ID:  MALACHI ALDERETE, DOB March 25, 1976, MRN [id number]  PCP:  Pccm, Raymond Gurney, MD  Cardiologist:  Lorine Bears, MD    No chief complaint on file.     History of Present Illness: Bianca Acosta is a 47 y.o. female who is here today regarding resistant hypertension. She has a known history of HTN, tobacco use, obesity and brain aneurysum.  She reports prolonged history of difficult to control hypertension which started in early 30s.  She has strong family history of hypertension.   Echocardiogram in 2020 showed normal LV systolic function.  She had an episode of chest pain in January 2020.  Coronary calcium score was 0.  Event monitor showed 4 beats of wide-complex tachycardia felt to be SVT with aberrancy with 4 episodes of SVT the longest lasted 9.9 seconds.  She was subsequently diagnosed with hyperthyroidism and treated with methimazole.  Most likely she will require thyroidectomy. Chlorthalidone had to be stopped due to severe hypokalemia in spite of supplementation. The patient has been following with pulmonary for suspected COPD.  She continues to smoke.  She is trying to lose weight.  Past Medical History:  Diagnosis Date   Brain aneurysm 06/29/2015   a. s/p craniotomy.   Chest pain    a. 09/2019 Cardiac CT: Ca2+ score = Zero.   Chicken pox    Headache    History of echocardiogram    a. 12/2018 Echo: EF 60-65%. DD. Mild LAE; b. 11/2019 Echo: EF 60-65%, no rwma, nl PASP.    Hypertension    PSVT (paroxysmal supraventricular tachycardia)    a. 08/2019 Zio: Avg HR 84 (NSR). 4 beats WCT (likely SVT w/ aberrancy). 4 SVT episodes, longes 9.9 secs. Rare PACs/PVCs (<1%). Some triggered events = SVT and/or PVCs.   Sleep apnea 10/2018   a. Does not tolerate CPAP.   Thyroid nodule greater than or equal to 1.5 cm in diameter incidentally noted on imaging study    a. 09/2019 CTA Chest: Ill-defined 2.5cm L thyroid nodule - rec f/u  U/S; b. 10/2019 Thyroid U/S: 4.4cm L-sided thyroid nodule. FNA rec; c. 11/2019 FNA benign.   Tobacco abuse     Past Surgical History:  Procedure Laterality Date   CRANIOTOMY Right 06/29/2015   Procedure: CRANIOTOMY INTRACRANIAL ANEURYSM CLIPPING;  Surgeon: Loura Halt Ditty, MD;  Location: [facility];  Service: Neurosurgery;  Laterality: Right;   IR GENERIC HISTORICAL  05/25/2016   IR ANGIO INTRA EXTRACRAN SEL INTERNAL CAROTID BILAT MOD SED 05/25/2016 Lisbeth Renshaw, MD [facility]   IR GENERIC HISTORICAL  06/29/2015   IR ANGIO INTRA EXTRACRAN SEL INTERNAL CAROTID BILAT MOD SED 06/29/2015 Lisbeth Renshaw, MD [facility]   IR GENERIC HISTORICAL  06/29/2015   IR ANGIO VERTEBRAL SEL VERTEBRAL UNI L MOD SED 06/29/2015 Lisbeth Renshaw, MD [facility]   IR GENERIC HISTORICAL  06/29/2015   IR 3D INDEPENDENT WKST 06/29/2015 Lisbeth Renshaw, MD [facility]   UTERINE FIBROID SURGERY  2020     Current Outpatient Medications  Medication Sig Dispense Refill   albuterol (PROVENTIL HFA) 108 (90 Base) MCG/ACT inhaler Inhale 2 puffs into the lungs every 4 (four) hours as needed for wheezing or shortness of breath. 1 each 2   albuterol (PROVENTIL) (2.5 MG/3ML) 0.083% nebulizer solution Take 3 mLs (2.5 mg total) by nebulization every 6 (six) hours as needed for wheezing or shortness of breath. 75 mL 2   carvedilol (COREG) 12.5  MG tablet Take 1 tablet (12.5 mg total) by mouth 2 (two) times daily. 180 tablet 1   cetirizine (ZYRTEC) 10 MG tablet TAKE 1 TABLET BY MOUTH EVERY DAY 30 tablet 0   cholecalciferol (VITAMIN D3) 25 MCG (1000 UT) tablet Take 1 tablet (1,000 Units total) by mouth daily. 90 tablet 3   methimazole (TAPAZOLE) 5 MG tablet Take 0.5 tablets (2.5 mg total) by mouth daily. 45 tablet 1   Probiotic Product (FORTIFY PROBIOTIC WOMENS EX ST) CPDR Take 1 capsule by mouth daily. 30 capsule 2   spironolactone (ALDACTONE) 25 MG tablet TAKE 1 TABLET (25 MG TOTAL) BY MOUTH DAILY. 90  tablet 0   triamterene-hydrochlorothiazide (DYAZIDE) 37.5-25 MG capsule TAKE 1 EACH (1 CAPSULE TOTAL) BY MOUTH DAILY. 90 capsule 0   No current facility-administered medications for this visit.    Allergies:   Amoxicillin, Augmentin [amoxicillin-pot clavulanate], Ciprofloxacin, Clavulanic acid, and Macrobid [nitrofurantoin]    Social History:  The patient  reports that she has been smoking cigarettes. She started smoking about 27 years ago. She has a 20.00 pack-year smoking history. She has never used smokeless tobacco. She reports current alcohol use. She reports that she does not use drugs.   Family History:  The patient's family history includes Hypertension in her father, maternal aunt, maternal grandfather, maternal grandmother, maternal uncle, mother, paternal aunt, paternal grandfather, paternal grandmother, and paternal uncle.    ROS:  Please see the history of present illness.   Otherwise, review of systems are positive for none.   All other systems are reviewed and negative.    PHYSICAL EXAM: VS:  There were no vitals taken for this visit. , BMI There is no height or weight on file to calculate BMI. GEN: Well nourished, well developed, in no acute distress  HEENT: normal  Neck: no JVD, carotid bruits, or masses Cardiac: RRR; no rubs, or gallops,no edema .  1 out of 6 systolic murmur in the aortic area. Respiratory:  clear to auscultation bilaterally, normal work of breathing GI: soft, nontender, nondistended, + BS MS: no deformity or atrophy  Skin: warm and dry, no rash Neuro:  Strength and sensation are intact Psych: euthymic mood, full affect   EKG:  EKG is ordered today. The ekg ordered today demonstrates normal sinus rhythm with nonspecific T wave changes and mildly prolonged QT interval.  Recent Labs: 04/12/2022: ALT 20; BUN 13; Creatinine, Ser 1.03; Hemoglobin 13.3; Platelets 221.0; Potassium 4.3; Sodium 141; TSH 0.83    Lipid Panel    Component Value Date/Time    CHOL 134 05/16/2018 0927   TRIG 81 05/16/2018 0927   HDL 49 (L) 05/16/2018 0927   CHOLHDL 2.7 05/16/2018 0927   VLDL 16.2 04/23/2016 1011   LDLCALC 69 05/16/2018 0927      Wt Readings from Last 3 Encounters:  07/25/22 253 lb 9.6 oz (115 kg)  07/10/22 253 lb (114.8 kg)  07/09/22 265 lb (120.2 kg)        ASSESSMENT AND PLAN:  1.  Refractory hypertension: T blood pressure is well controlled on carvedilol, spironolactone and triamterene hydrochlorothiazide.  Medications were refilled.  I requested basic metabolic profile.   2.  Hypothyroidism: She is controlled with methimazole and is considering lobectomy.  3.  Tobacco use: I again discussed with her the importance of smoking cessation but she is concerned about weight gain associated with smoking cessation.   4.  Obesity: I discussed with her the importance of healthy lifestyle changes and regular exercise.  She already started the process.  5.  PSVT: Well-controlled with carvedilol.   Disposition:   FU with me in 6 months  Signed, Lorine Bears, MD  01/08/2023 1:21 PM    [facility]

## 2023-01-20 ENCOUNTER — Other Ambulatory Visit: Payer: Self-pay | Admitting: Internal Medicine

## 2023-05-21 ENCOUNTER — Other Ambulatory Visit: Payer: Self-pay | Admitting: Cardiovascular Disease

## 2023-05-21 NOTE — Telephone Encounter (Signed)
Patient needs an appt with Dr. Kirke Corin

## 2023-06-14 ENCOUNTER — Other Ambulatory Visit: Payer: Self-pay | Admitting: Cardiovascular Disease

## 2023-06-19 ENCOUNTER — Other Ambulatory Visit: Payer: Self-pay

## 2023-06-19 ENCOUNTER — Telehealth: Payer: Self-pay | Admitting: Cardiovascular Disease

## 2023-06-19 NOTE — Telephone Encounter (Signed)
Left voice mail to schedule

## 2023-06-19 NOTE — Telephone Encounter (Signed)
-----   Message from [facility] Jasmine December C sent at 06/14/2023  1:05 PM EDT ----- Please contact patient for a follow up with Dr. Kirke Corin. The patient is requesting a refill for medications.  Thanks, Jasmine December

## 2023-06-28 ENCOUNTER — Other Ambulatory Visit: Payer: Self-pay | Admitting: Cardiovascular Disease

## 2023-07-21 ENCOUNTER — Other Ambulatory Visit: Payer: Self-pay | Admitting: Cardiovascular Disease

## 2023-07-31 ENCOUNTER — Other Ambulatory Visit: Payer: Self-pay | Admitting: Internal Medicine

## 2023-08-11 ENCOUNTER — Emergency Department
Admission: EM | Admit: 2023-08-11 | Discharge: 2023-08-11 | Disposition: A | Discharge status: Home / Self Care | Payer: BC Managed Care – PPO | Attending: Emergency Medicine | Admitting: Emergency Medicine

## 2023-08-11 ENCOUNTER — Other Ambulatory Visit: Payer: Self-pay

## 2023-08-11 ENCOUNTER — Emergency Department: Payer: BC Managed Care – PPO

## 2023-08-11 DIAGNOSIS — Z20822 Contact with and (suspected) exposure to covid-19: Secondary | ICD-10-CM | POA: Diagnosis not present

## 2023-08-11 DIAGNOSIS — J45901 Unspecified asthma with (acute) exacerbation: Secondary | ICD-10-CM | POA: Insufficient documentation

## 2023-08-11 DIAGNOSIS — R0602 Shortness of breath: Secondary | ICD-10-CM | POA: Diagnosis not present

## 2023-08-11 DIAGNOSIS — R079 Chest pain, unspecified: Secondary | ICD-10-CM | POA: Diagnosis not present

## 2023-08-11 LAB — COMPREHENSIVE METABOLIC PANEL
ALT: 22 U/L (ref 0–44)
AST: 22 U/L (ref 15–41)
Albumin: 3.7 g/dL (ref 3.5–5.0)
Alkaline Phosphatase: 64 U/L (ref 38–126)
Anion gap: 10 (ref 5–15)
BUN: 16 mg/dL (ref 6–20)
CO2: 22 mmol/L (ref 22–32)
Calcium: 9.2 mg/dL (ref 8.9–10.3)
Chloride: 108 mmol/L (ref 98–111)
Creatinine, Ser: 0.81 mg/dL (ref 0.44–1.00)
GFR, Estimated: >60 mL/min (ref >60)
Glucose, Bld: 133 mg/dL — ABNORMAL HIGH (ref 70–99)
Potassium: 3.6 mmol/L (ref 3.5–5.1)
Sodium: 140 mmol/L (ref 135–145)
Total Bilirubin: 0.7 mg/dL (ref <1.2)
Total Protein: 7.4 g/dL (ref 6.5–8.1)

## 2023-08-11 LAB — CBC
HCT: 39.1 % (ref 36.0–46.0)
Hemoglobin: 12.9 g/dL (ref 12.0–15.0)
MCH: 26.7 pg (ref 26.0–34.0)
MCHC: 33 g/dL (ref 30.0–36.0)
MCV: 80.8 fL (ref 80.0–100.0)
Platelets: 230 10*3/uL (ref 150–400)
RBC: 4.84 MIL/uL (ref 3.87–5.11)
RDW: 15.1 % (ref 11.5–15.5)
WBC: 7.7 10*3/uL (ref 4.0–10.5)
nRBC: 0 % (ref 0.0–0.2)

## 2023-08-11 LAB — TROPONIN I (HIGH SENSITIVITY): Troponin I (High Sensitivity): 10 ng/L (ref <18)

## 2023-08-11 LAB — POC URINE PREG, ED: Preg Test, Ur: NEGATIVE

## 2023-08-11 LAB — RESP PANEL BY RT-PCR (RSV, FLU A&B, COVID)  RVPGX2
Influenza A by PCR: NEGATIVE
Influenza B by PCR: NEGATIVE
Resp Syncytial Virus by PCR: NEGATIVE
SARS Coronavirus 2 by RT PCR: NEGATIVE

## 2023-08-11 MED ORDER — PREDNISONE 20 MG PO TABS: 40.0000 mg | ORAL_TABLET | Freq: Every day | ORAL | 0 refills | Status: AC

## 2023-08-11 MED ORDER — PREDNISONE 20 MG PO TABS
60.0000 mg | ORAL_TABLET | Freq: Once | ORAL | Status: AC
  Administered 2023-08-11: 60 mg via ORAL
  Filled 2023-08-11: qty 3

## 2023-08-11 MED ORDER — IPRATROPIUM-ALBUTEROL 0.5-2.5 (3) MG/3ML IN SOLN
3.0000 mL | Freq: Once | RESPIRATORY_TRACT | Status: AC
  Administered 2023-08-11: 3 mL via RESPIRATORY_TRACT
  Filled 2023-08-11: qty 3

## 2023-08-11 MED ORDER — BENZONATATE 100 MG PO CAPS: 100.0000 mg | ORAL_CAPSULE | Freq: Three times a day (TID) | ORAL | 0 refills | Status: AC | PRN

## 2023-08-11 MED ORDER — DOXYCYCLINE HYCLATE 100 MG PO TABS: 100.0000 mg | ORAL_TABLET | Freq: Two times a day (BID) | ORAL | 0 refills | Status: AC

## 2023-08-11 MED ORDER — IPRATROPIUM-ALBUTEROL 0.5-2.5 (3) MG/3ML IN SOLN
3.0000 mL | Freq: Once | RESPIRATORY_TRACT | Status: AC
  Administered 2023-08-11: 3 mL via RESPIRATORY_TRACT

## 2023-08-11 NOTE — ED Provider Notes (Signed)
[facility] Provider Note    Event Date/Time   First MD Initiated Contact with Patient 08/11/23 0732     (approximate)   History   Shortness of Breath   HPI  Bianca Acosta is a 47 y.o. female with hyperthyroidism with goiter, asthma who comes in with concerns for shortness of breath.  Patient reports 3 days of shortness of breath, coughing, nasal congestion not better with home medications.  She was seen a year ago for similar and sent home with treatments for asthma exacerbation.  She states that her sister wanted her to come in to make sure there was no pneumonia on the x-ray.  She denies any risk factors for blood clots, no estrogen use no recent travel no swelling in legs no calf tenderness.  She reports that her chest pain that she reported is more just from the coughing and that this been going on for couple days as well.  Physical Exam   Triage Vital Signs: ED Triage Vitals  Encounter Vitals Group     BP 08/11/23 0606 (!) 163/95     Systolic BP Percentile --      Diastolic BP Percentile --      Pulse Rate 08/11/23 0606 (!) 105     Resp 08/11/23 0606 20     Temp 08/11/23 0606 99.7 F (37.6 C)     Temp Source 08/11/23 0606 Oral     SpO2 08/11/23 0606 96 %     Weight 08/11/23 0604 250 lb (113.4 kg)     Height 08/11/23 0604 5\' 3"  (1.6 m)     Head Circumference --      Peak Flow --      Pain Score 08/11/23 0604 4     Pain Loc --      Pain Education --      Exclude from Growth Chart --     Most recent vital signs: Vitals:   08/11/23 0606  BP: (!) 163/95  Pulse: (!) 105  Resp: 20  Temp: 99.7 F (37.6 C)  SpO2: 96%     General: Awake, no distress.  CV:  Good peripheral perfusion.  Resp:  Normal effort.  Diffuse wheezing noted Abd:  No distention.  Other:  No swelling in legs no calf tenderness    ED Results / Procedures / Treatments   Labs (all labs ordered are listed, but only abnormal results are displayed) Labs Reviewed   COMPREHENSIVE METABOLIC PANEL - Abnormal; Notable for the following components:      Result Value   Glucose, Bld 133 (*)    All other components within normal limits  RESP PANEL BY RT-PCR (RSV, FLU A&B, COVID)  RVPGX2  CBC  POC URINE PREG, ED  TROPONIN I (HIGH SENSITIVITY)  TROPONIN I (HIGH SENSITIVITY)     EKG  My interpretation of EKG:  Sinus tachycardia rate of 106 any ST elevation or T wave inversions, normal intervals  RADIOLOGY I have reviewed the xray personally and interpreted and no pneumonia  PROCEDURES:  Critical Care performed: No  .1-3 Lead EKG Interpretation  Performed by: Concha Se, MD Authorized by: Concha Se, MD     Interpretation: abnormal     ECG rate:  100   ECG rate assessment: tachycardic     Rhythm: sinus tachycardia     Ectopy: none     Conduction: normal      MEDICATIONS ORDERED IN ED: Medications  predniSONE (DELTASONE) tablet 60  mg (has no administration in time range)  ipratropium-albuterol (DUONEB) 0.5-2.5 (3) MG/3ML nebulizer solution 3 mL (3 mLs Nebulization Given 08/11/23 0817)  ipratropium-albuterol (DUONEB) 0.5-2.5 (3) MG/3ML nebulizer solution 3 mL (3 mLs Nebulization Given 08/11/23 0817)  ipratropium-albuterol (DUONEB) 0.5-2.5 (3) MG/3ML nebulizer solution 3 mL (3 mLs Nebulization Given 08/11/23 0816)     IMPRESSION / MDM / ASSESSMENT AND PLAN / ED COURSE  I reviewed the triage vital signs and the nursing notes.   Patient's presentation is most consistent with acute presentation with potential threat to life or bodily function.   Patient comes in with concerns for asthma exacerbation, pneumonia, viral illness.  Considered PE but thought to be less likely given no risk factors for this.  She is slightly tachycardic but given her multiple infectious symptoms and is significant wheezing on examination this seems more likely.  No evidence of DVT on examination.  Heart markers were ordered from triage but she reports that  her chest pain is mostly just with coughing.  This been going on for 3 days and do not need a repeat today.  Troponin was negative.  CBC shows normal white count.  COVID, flu are negative.  CMP reassuring.  Patient was ambulated with pulse ox going down to 89%.  Recommended admission for monitoring of her oxygen levels but at rest she states that 93 to 96%.  Patient states that she has a pulse ox at home and declines admission.  Will cover for possible atypical infection is not seen on chest x-ray given her viral swab was negative, recommended continuing to use her albuterol nebulizer at home which she did reports having enough albuterol, steroids, Tessalon Perles.  Patient states that she will come back if she develops more shortness of breath but she reports that after the DuoNeb she feels much improved.  Still slightly tachycardic and given IV shortage and patient can tolerate p.o. encourage p.o. hydration.      The patient is on the cardiac monitor to evaluate for evidence of arrhythmia and/or significant heart rate changes.      FINAL CLINICAL IMPRESSION(S) / ED DIAGNOSES   Final diagnoses:  Exacerbation of asthma, unspecified asthma severity, unspecified whether persistent     Rx / DC Orders   ED Discharge Orders     None        Note:  This document was prepared using Dragon voice recognition software and may include unintentional dictation errors.   Concha Se, MD 08/11/23 [phone]

## 2023-08-11 NOTE — Discharge Instructions (Addendum)
Continue to use your albuterol every 4-6 hours and start the steroids tomorrow. We had discussed admission to the hospital given your borderline low oxygen but at this time you have opted to decline.  If you develop worsening symptoms including worsening shortness of breath or any other concerns please return to the ER for repeat evaluation.

## 2023-08-11 NOTE — ED Triage Notes (Signed)
Pt reports cough x 2 days. Reports onset of chest tightness and SOB today. Pt reports she has asthma and was wheezing earlier and did 2 albuterol treatments at home. Congested cough noted.  Pt alert and oriented. Breathing unlabored speaking in full sentences with symmetric chest rise and fall.

## 2023-08-12 ENCOUNTER — Telehealth: Payer: Self-pay | Admitting: Cardiovascular Disease

## 2023-08-12 ENCOUNTER — Telehealth: Payer: Self-pay | Admitting: Internal Medicine

## 2023-08-12 NOTE — Telephone Encounter (Signed)
Spoke with patient and reviewed her recent visit to ED in regards to her shortness of breath. She states calling all of her providers for follow up appointments. Reviewed her visit to ED. She went in with shortness of breath, coughing, and nasal congestion which were not better taking over the counter remedies. She mentioned in ED EKG showed something was abnormal. Reviewed ED EKG and it showed she had fast heart rate which can happen if using inhalers. Scheduled for her to come in and be seen by Ward Givens NP. Instructed her to call back if any further questions and discussed strict ED precautions. She verbalized understanding.

## 2023-08-12 NOTE — Telephone Encounter (Signed)
Patient was seen in the Emergency Department. Patient would like Dr. Belia Heman to review test results. Patient phone number is [phone].

## 2023-08-12 NOTE — Telephone Encounter (Signed)
Pt called in asking for Dr. Kirke Corin to look over her notes from the ER and advise on her poc.

## 2023-08-12 NOTE — Telephone Encounter (Addendum)
I spoke with the patient. She was in the ED yesterday. She said she is feeling better. Her SOB is better, cough with clear sputum, and a little wheezing. She was given Doxycycline, Prednisone and Benzonate to take after her ED visit. She is taking her Albuterol Neb every 4-6 hours like they told her to. She is not using her Albuterol inhaler. She did say her O2 was in the high 80's when she was in the ED. She check her O2 while on the phone and it was at 99%.  I told her Dr.Kasa is out of the office until 08/22/2023. I asked if she wanted me to send the message to the provider on call and see if they had anymore suggestions. She said she was feeling better and just wanted Dr. Belia Acosta to review her chart when he returns. I told her I would send him the message. She will call our office back if she needs anything before then.

## 2023-08-17 ENCOUNTER — Other Ambulatory Visit: Payer: Self-pay | Admitting: Cardiovascular Disease

## 2023-08-22 NOTE — Telephone Encounter (Signed)
 Lm x1 for the patient.

## 2023-08-27 MED ORDER — ALBUTEROL SULFATE HFA 108 (90 BASE) MCG/ACT IN AERS: 2.0000 | INHALATION_SPRAY | RESPIRATORY_TRACT | 11 refills | Status: AC | PRN

## 2023-08-27 NOTE — Telephone Encounter (Signed)
 I notified the patient. She asked for a refill of her Albuterol. I have sent in the refill and the patient is aware.  Nothing further needed.

## 2023-08-30 ENCOUNTER — Ambulatory Visit: Payer: BC Managed Care – PPO | Admitting: Nurse Practitioner

## 2023-10-01 ENCOUNTER — Ambulatory Visit: Payer: BC Managed Care – PPO | Attending: Nurse Practitioner | Admitting: Nurse Practitioner

## 2023-10-01 NOTE — Progress Notes (Deleted)
 Office Visit    Patient Name: Bianca Acosta Date of Encounter: 10/01/2023  Primary Care Provider:  [facility], Raymond Gurney, MD Primary Cardiologist:  Lorine Bears, MD  Chief Complaint    48 y.o. female   Past Medical History  Subjective   Past Medical History:  Diagnosis Date   Brain aneurysm 06/29/2015   a. s/p craniotomy.   Chest pain    a. 09/2019 Cardiac CT: Ca2+ score = Zero.   Chicken pox    Headache    History of echocardiogram    a. 12/2018 Echo: EF 60-65%. DD. Mild LAE; b. 11/2019 Echo: EF 60-65%, no rwma, nl PASP.    Hypertension    PSVT (paroxysmal supraventricular tachycardia) (HCC)    a. 08/2019 Zio: Avg HR 84 (NSR). 4 beats WCT (likely SVT w/ aberrancy). 4 SVT episodes, longes 9.9 secs. Rare PACs/PVCs (<1%). Some triggered events = SVT and/or PVCs.   Sleep apnea 10/2018   a. Does not tolerate CPAP.   Thyroid nodule greater than or equal to 1.5 cm in diameter incidentally noted on imaging study    a. 09/2019 CTA Chest: Ill-defined 2.5cm L thyroid nodule - rec f/u U/S; b. 10/2019 Thyroid U/S: 4.4cm L-sided thyroid nodule. FNA rec; c. 11/2019 FNA benign.   Tobacco abuse    Past Surgical History:  Procedure Laterality Date   CRANIOTOMY Right 06/29/2015   Procedure: CRANIOTOMY INTRACRANIAL ANEURYSM CLIPPING;  Surgeon: Loura Halt Ditty, MD;  Location: [facility];  Service: Neurosurgery;  Laterality: Right;   IR GENERIC HISTORICAL  05/25/2016   IR ANGIO INTRA EXTRACRAN SEL INTERNAL CAROTID BILAT MOD SED 05/25/2016 Lisbeth Renshaw, MD [facility]   IR GENERIC HISTORICAL  06/29/2015   IR ANGIO INTRA EXTRACRAN SEL INTERNAL CAROTID BILAT MOD SED 06/29/2015 Lisbeth Renshaw, MD [facility]   IR GENERIC HISTORICAL  06/29/2015   IR ANGIO VERTEBRAL SEL VERTEBRAL UNI L MOD SED 06/29/2015 Lisbeth Renshaw, MD [facility]   IR GENERIC HISTORICAL  06/29/2015   IR 3D INDEPENDENT WKST 06/29/2015 Lisbeth Renshaw, MD [facility]   UTERINE FIBROID SURGERY  2020     Allergies  Allergies  Allergen Reactions   Amoxicillin    Augmentin [Amoxicillin-Pot Clavulanate] Hives   Ciprofloxacin Itching    Patient states she starts itching from inside or internal.   Clavulanic Acid    Macrobid [Nitrofurantoin] Hives      History of Present Illness      48 y.o. y/o female {There is no content from the last Narrative History section.}     Objective  Home Medications    Current Outpatient Medications  Medication Sig Dispense Refill   albuterol (PROVENTIL HFA) 108 (90 Base) MCG/ACT inhaler Inhale 2 puffs into the lungs every 4 (four) hours as needed for wheezing or shortness of breath. 1 each 11   albuterol (PROVENTIL) (2.5 MG/3ML) 0.083% nebulizer solution Take 3 mLs (2.5 mg total) by nebulization every 6 (six) hours as needed for wheezing or shortness of breath. 75 mL 2   benzonatate (TESSALON PERLES) 100 MG capsule Take 1 capsule (100 mg total) by mouth 3 (three) times daily as needed for cough. 30 capsule 0   carvedilol (COREG) 12.5 MG tablet Take 1 tablet (12.5 mg total) by mouth 2 (two) times daily. 180 tablet 1   cetirizine (ZYRTEC) 10 MG tablet TAKE 1 TABLET BY MOUTH EVERY DAY 30 tablet 0   cholecalciferol (VITAMIN D3) 25 MCG (1000 UT) tablet Take 1 tablet (1,000 Units total) by  mouth daily. 90 tablet 3   methimazole (TAPAZOLE) 5 MG tablet TAKE 1/2 TABLET BY MOUTH DAILY 45 tablet 1   Probiotic Product (FORTIFY PROBIOTIC WOMENS EX ST) CPDR Take 1 capsule by mouth daily. 30 capsule 2   spironolactone (ALDACTONE) 25 MG tablet Take 1 tablet (25 mg total) by mouth daily. 90 tablet 0   triamterene-hydrochlorothiazide (DYAZIDE) 37.5-25 MG capsule TAKE 1 EACH (1 CAPSULE TOTAL) BY MOUTH DAILY. OVERDUE FOLLOW UP VISIT. PLEASE CALL OFFICE TO SCHEDULE APPOINTMENT PRIOR TO NEXT REFILL (FIRST ATTEMPT) 30 capsule 0   No current facility-administered medications for this visit.     Physical Exam    VS:  There were no vitals taken for this visit. , BMI There  is no height or weight on file to calculate BMI.       GEN: Well nourished, well developed, in no acute distress. HEENT: normal. Neck: Supple, no JVD, carotid bruits, or masses. Cardiac: RRR, no murmurs, rubs, or gallops. No clubbing, cyanosis, edema.  Radials 2+/PT 2+ and equal bilaterally.  Respiratory:  Respirations regular and unlabored, clear to auscultation bilaterally. GI: Soft, nontender, nondistended, BS + x 4. MS: no deformity or atrophy. Skin: warm and dry, no rash. Neuro:  Strength and sensation are intact. Psych: Normal affect.  Accessory Clinical Findings    ECG personally reviewed by me today -    *** - no acute changes.  Lab Results  Component Value Date   WBC 7.7 08/11/2023   HGB 12.9 08/11/2023   HCT 39.1 08/11/2023   MCV 80.8 08/11/2023   PLT 230 08/11/2023   Lab Results  Component Value Date   CREATININE 0.81 08/11/2023   BUN 16 08/11/2023   NA 140 08/11/2023   K 3.6 08/11/2023   CL 108 08/11/2023   CO2 22 08/11/2023   Lab Results  Component Value Date   ALT 22 08/11/2023   AST 22 08/11/2023   ALKPHOS 64 08/11/2023   BILITOT 0.7 08/11/2023   Lab Results  Component Value Date   CHOL 134 05/16/2018   HDL 49 (L) 05/16/2018   LDLCALC 69 05/16/2018   TRIG 81 05/16/2018   CHOLHDL 2.7 05/16/2018    Lab Results  Component Value Date   HGBA1C 6.2 (A) 04/12/2022   Lab Results  Component Value Date   TSH 0.83 04/12/2022       Assessment & Plan    1.  ***  Nicolasa Ducking, NP 10/01/2023, 12:15 PM

## 2023-10-04 NOTE — Telephone Encounter (Signed)
I have called to get pt scheduled and left a message

## 2023-10-11 NOTE — Telephone Encounter (Signed)
 Called pt again and message is full

## 2023-10-21 ENCOUNTER — Other Ambulatory Visit: Payer: Self-pay | Admitting: Cardiovascular Disease

## 2023-10-21 NOTE — Telephone Encounter (Signed)
 Hi,  Could you schedule this patient an overdue follow up visit with Dr. Kirke Corin? The patient was last seen by Flavia Shipper on 07-10-2022. Thank you so much.

## 2023-10-22 NOTE — Telephone Encounter (Signed)
LVM to schedule fu appt. Please schedule.

## 2023-10-24 NOTE — Telephone Encounter (Signed)
LVM to schedule fu appt, please schedule 

## 2023-11-13 ENCOUNTER — Telehealth: Payer: Self-pay | Admitting: Internal Medicine

## 2023-11-13 NOTE — Telephone Encounter (Signed)
 New message    Patient is asking for a call back to discuss thyroid removal .   Last office visit  3.19.2024

## 2023-11-15 NOTE — Telephone Encounter (Signed)
 LMx1 for patient to call office to schedule follow up appointment with Dr. Lonzo Cloud.

## 2024-01-15 ENCOUNTER — Ambulatory Visit: Admitting: Internal Medicine

## 2024-01-22 ENCOUNTER — Ambulatory Visit: Admitting: Internal Medicine

## 2024-02-05 ENCOUNTER — Ambulatory Visit (INDEPENDENT_AMBULATORY_CARE_PROVIDER_SITE_OTHER): Admitting: Internal Medicine

## 2024-02-05 ENCOUNTER — Encounter: Payer: Self-pay | Admitting: Internal Medicine

## 2024-02-05 VITALS — BP 120/72 | HR 93 | Ht 63.0 in | Wt 254.0 lb

## 2024-02-05 DIAGNOSIS — E0590 Thyrotoxicosis, unspecified without thyrotoxic crisis or storm: Secondary | ICD-10-CM | POA: Diagnosis not present

## 2024-02-05 DIAGNOSIS — E041 Nontoxic single thyroid nodule: Secondary | ICD-10-CM | POA: Diagnosis not present

## 2024-02-05 NOTE — Patient Instructions (Signed)
 Dr. Krystal Eletha [facility] [address] [place], [place] [address] Office: [phone]

## 2024-02-05 NOTE — Progress Notes (Signed)
 Name: Bianca Acosta  MRN/ DOB: [id number], 1976/07/22    Age/ Sex: 48 y.o., female     PCP: [facility], [place], MD   Reason for Endocrinology Evaluation: Hyperthyroidism     Initial Endocrinology Clinic Visit: 10/29/2019    PATIENT IDENTIFIER: [name] is a 48 y.o., female with a past medical history of HTN, brain aneurysm, paroxysmal SVT and OSA. She has followed with [facility] since 10/29/2019 for consultative assistance with management of her hyperthyroidism.   HISTORICAL SUMMARY:   Patient has been noted with hyperthyroidism in 09/2019 during evaluation of resistant hypertension.  Her TSH was 0.270 uIU/mL , with an elevated free T4 at 1.25 ng/dL patient was also noted to have an incidental left thyroid  nodule on CT scan which was ordered for evaluation of shortness of breath.     Methimazole  was started 09/2019   TRAB was undetectable    A thyroid  ultrasound 11/09/2019 showed a left inferior 4.4 cm nodule meeting FNA criteria which showed benign follicular nodule (Bethesda category II) on 11/26/2019.    The pt opted to hold off on uptake and scan at the time, but after seeing the surgeon for thyroidectomy, uptake and scan was requested by the surgeon to determine the plan of action.   Thyroid  uptake and scan 10/24/2022 showed enlarged left thyroid  lobe versus right secondary to a large cold nodule in the mid inferior pole, pyramidal lobe noted, 24-hour uptake normal at 28.3% (10-30 %)    No history of amiodarone use.   No FH of thyroid  disease    SUBJECTIVE:    Today (02/05/2024):  Ms. [name] is here for a follow up on toxic thyroid  nodule.   Weight continues to fluctuate She is accompanied by her best friend  Denies nausea or  vomiting Has noted local neck swelling  Denies palpitations  Denies tremors  Denies constipation or diarrhea   Patient with history of prediabetes A1c 6.0% in 2021, concerned about developing  diabetes  Methimazole  5 mg, half a tablet daily  She is on vitamin d     HISTORY:  Past Medical History:  Past Medical History:  Diagnosis Date   Brain aneurysm 06/29/2015   a. s/p craniotomy.   Chest pain    a. 09/2019 Cardiac CT: Ca2+ score = Zero.   Chicken pox    Headache    History of echocardiogram    a. 12/2018 Echo: EF 60-65%. DD. Mild LAE; b. 11/2019 Echo: EF 60-65%, no rwma, nl PASP.    Hypertension    PSVT (paroxysmal supraventricular tachycardia) (HCC)    a. 08/2019 Zio: Avg HR 84 (NSR). 4 beats WCT (likely SVT w/ aberrancy). 4 SVT episodes, longes 9.9 secs. Rare PACs/PVCs (<1%). Some triggered events = SVT and/or PVCs.   Sleep apnea 10/2018   a. Does not tolerate CPAP.   Thyroid  nodule greater than or equal to 1.5 cm in diameter incidentally noted on imaging study    a. 09/2019 CTA Chest: Ill-defined 2.5cm L thyroid  nodule - rec f/u U/S; b. 10/2019 Thyroid  U/S: 4.4cm L-sided thyroid  nodule. FNA rec; c. 11/2019 FNA benign.   Tobacco abuse    Past Surgical History:  Past Surgical History:  Procedure Laterality Date   CRANIOTOMY Right 06/29/2015   Procedure: CRANIOTOMY INTRACRANIAL ANEURYSM CLIPPING;  Surgeon: Morene Hicks Ditty, MD;  Location: [facility];  Service: Neurosurgery;  Laterality: Right;   IR GENERIC HISTORICAL  05/25/2016   IR ANGIO INTRA EXTRACRAN SEL INTERNAL  CAROTID BILAT MOD SED 05/25/2016 Gerldine Maizes, MD [facility]   IR GENERIC HISTORICAL  06/29/2015   IR ANGIO INTRA EXTRACRAN SEL INTERNAL CAROTID BILAT MOD SED 06/29/2015 Gerldine Maizes, MD [facility]   IR GENERIC HISTORICAL  06/29/2015   IR ANGIO VERTEBRAL SEL VERTEBRAL UNI L MOD SED 06/29/2015 Gerldine Maizes, MD [facility]   IR GENERIC HISTORICAL  06/29/2015   IR 3D INDEPENDENT WKST 06/29/2015 Gerldine Maizes, MD [facility]   UTERINE FIBROID SURGERY  2020   Social History:  reports that she has been smoking cigarettes. She started smoking about 28 years ago. She has a 28.5  pack-year smoking history. She has never used smokeless tobacco. She reports current alcohol use. She reports that she does not use drugs. Family History:  Family History  Problem Relation Age of Onset   Hypertension Mother    Hypertension Father    Hypertension Maternal Aunt    Hypertension Maternal Uncle    Hypertension Paternal Aunt    Hypertension Paternal Uncle    Hypertension Maternal Grandmother    Hypertension Maternal Grandfather    Hypertension Paternal Grandmother    Hypertension Paternal Grandfather      HOME MEDICATIONS: Allergies as of 02/05/2024       Reactions   Amoxicillin    Augmentin [amoxicillin-pot Clavulanate] Hives   Ciprofloxacin Itching   Patient states she starts itching from inside or internal.   Clavulanic Acid    Macrobid [nitrofurantoin] Hives        Medication List        Accurate as of February 05, 2024  3:18 PM. If you have any questions, ask your nurse or doctor.          albuterol  (2.5 MG/3ML) 0.083% nebulizer solution Commonly known as: PROVENTIL  Take 3 mLs (2.5 mg total) by nebulization every 6 (six) hours as needed for wheezing or shortness of breath.   albuterol  108 (90 Base) MCG/ACT inhaler Commonly known as: Proventil  HFA Inhale 2 puffs into the lungs every 4 (four) hours as needed for wheezing or shortness of breath.   benzonatate  100 MG capsule Commonly known as: Tessalon  Perles Take 1 capsule (100 mg total) by mouth 3 (three) times daily as needed for cough.   carvedilol  12.5 MG tablet Commonly known as: COREG  Take 1 tablet (12.5 mg total) by mouth 2 (two) times daily.   cetirizine  10 MG tablet Commonly known as: ZYRTEC  TAKE 1 TABLET BY MOUTH EVERY DAY   cholecalciferol 25 MCG (1000 UNIT) tablet Commonly known as: VITAMIN D3 Take 1 tablet (1,000 Units total) by mouth daily.   Fortify Probiotic Womens Ex St Cpdr Take 1 capsule by mouth daily.   methimazole  5 MG tablet Commonly known as: TAPAZOLE  TAKE 1/2  TABLET BY MOUTH DAILY   spironolactone  25 MG tablet Commonly known as: ALDACTONE  Take 1 tablet (25 mg total) by mouth daily.   triamterene -hydrochlorothiazide  37.5-25 MG capsule Commonly known as: DYAZIDE TAKE 1 EACH (1 CAPSULE TOTAL) BY MOUTH DAILY. OVERDUE FOLLOW UP VISIT. PLEASE CALL OFFICE TO SCHEDULE APPOINTMENT PRIOR TO NEXT REFILL (FIRST ATTEMPT)          OBJECTIVE:   PHYSICAL EXAM: VS: BP 120/72 (BP Location: Left Arm, Patient Position: Sitting, Cuff Size: Normal)   Pulse 93   Ht 5' 3 (1.6 m)   Wt 254 lb (115.2 kg)   SpO2 99%   BMI 44.99 kg/m     EXAM: General: Pt appears well and is in NAD  Eyes: External  eye exam normal without stare, lid lag or exophthalmos.  EOM intact.    Neck: General: Supple without adenopathy. Thyroid : Nodules appreciated   Lungs: Clear with good BS bilat   Heart: Auscultation: RRR.  Abdomen: soft, nontender  Extremities:  BL LE: No pretibial edema  Mental Status: Judgment, insight: Intact Orientation: Oriented to time, place, and person Mood and affect: No depression, anxiety, or agitation    DATA REVIEWED:   Latest Reference Range & Units 02/05/24 15:38  TSH mIU/L 0.28 (L)  Triiodothyronine,Free,Serum 2.3 - 4.2 pg/mL 3.4  T4,Free(Direct) 0.8 - 1.8 ng/dL 1.3    NEUT# 1.4 - 7.7 K/uL 1.9  Lymphocyte # 0.7 - 4.0 K/uL 2.2  Monocyte # 0.1 - 1.0 K/uL 0.5  Eosinophils Absolute 0.0 - 0.7 K/uL 0.3  Basophils Absolute 0.0 - 0.1 K/uL 0.0   Thyroid  Uptake and Scan 10/25/2022 FINDINGS: Enlarged LEFT thyroid  lobe versus RIGHT secondary to a large cool to cold nodule at the mid inferior pole LEFT lobe.   Pyramidal lobe noted.   No additional areas of increased or decreased tracer uptake.   4 hour I-123 uptake = 15.3% (normal 5-20%)   24 hour I-123 uptake = 28.3% (normal 10-30%)   IMPRESSION: Normal 4 hour and 24 hour radio iodine uptakes.   Cool to cold nodule at mid inferior LEFT thyroid  lobe; this has previously been  biopsied, recommend correlation with cytology results.   No additional thyroid  nodules identified scintigraphically.   Thyroid  Ultrasound 01/11/2021  The previously biopsied left thyroid  dominant TR 3 nodule measures 5.1 x 3.7 x 2.6 cm, previously 4.4 x 3.4 x 2.5 cm. Correlate with prior pathology.   No new thyroid  nodule.  No adenopathy.   IMPRESSION: Stable to slight increase in size of the dominant left thyroid  TR 3 nodule, previously biopsied.   No new thyroid  abnormality.    FNA left nodule 11/26/2019   FINAL MICROSCOPIC DIAGNOSIS:  - Consistent with benign follicular nodule (Bethesda category II)   ASSESSMENT / PLAN / RECOMMENDATIONS:   Hyperthyroidism  - Pt is clinically euthyroid  -Thyroid  uptake and scan showed the left thyroid  nodule is cold, the hyperactivity is at the thyroid  lobe with normal 24 uptake at 28.3% -I did explain to the patient that with hemithyroidectomy there is a chance that she will continue to need methimazole  - Pt is interested in thyroidectomy again at this time - TSH slightly low, will restart Methimazole  as below   Medications  Restart  Methimazole  5 mg, 1 tablet fours days a week       2. Left thyroid  nodule    - S/P benign FNA in 11/2019 -Thyroid  uptake and scan 10/2022 showed a cold nodule - She is interested in surgical intervention, pt to contact [facility], as she was evaluated by Dr. Eletha within the past 2 years  - Will proceed with an updated thyroid  ultrasound   F/U in 6 months    I spent 25 minutes preparing to see the patient by review of recent labs, imaging and procedures, obtaining and reviewing separately obtained history, communicating with the patient/family or caregiver, ordering medications, tests or procedures, and documenting clinical information in the EHR including the differential Dx, treatment, and any further evaluation and other management    Signed electronically by: Stefano Redgie Butts,  MD  [facility]  [facility] [address] [place], [place] [address] Phone: [phone] FAX: [phone]      CC: Pccm, [place], MD No  address on file Phone: None  Fax: None   Return to Endocrinology clinic as below: No future appointments.

## 2024-02-06 LAB — TSH: TSH: 0.28 m[IU]/L — ABNORMAL LOW

## 2024-02-06 LAB — T4, FREE: Free T4: 1.3 ng/dL (ref 0.8–1.8)

## 2024-02-06 LAB — T3, FREE: T3, Free: 3.4 pg/mL (ref 2.3–4.2)

## 2024-02-07 ENCOUNTER — Ambulatory Visit: Payer: Self-pay | Admitting: Internal Medicine

## 2024-02-20 ENCOUNTER — Ambulatory Visit
Admission: RE | Admit: 2024-02-20 | Discharge: 2024-02-20 | Disposition: A | Discharge status: Home / Self Care | Source: Ambulatory Visit | Attending: Emergency Medicine | Admitting: Emergency Medicine

## 2024-02-20 ENCOUNTER — Other Ambulatory Visit: Payer: Self-pay | Admitting: Cardiovascular Disease

## 2024-02-20 VITALS — BP 179/98 | HR 92 | Temp 97.9°F | Resp 18

## 2024-02-20 DIAGNOSIS — M25511 Pain in right shoulder: Secondary | ICD-10-CM

## 2024-02-20 MED ORDER — PREDNISONE 10 MG (21) PO TBPK: ORAL_TABLET | Freq: Every day | ORAL | 0 refills | Status: AC

## 2024-02-20 MED ORDER — CYCLOBENZAPRINE HCL 10 MG PO TABS: 10.0000 mg | ORAL_TABLET | Freq: Every day | ORAL | 0 refills | Status: AC

## 2024-02-20 NOTE — ED Triage Notes (Signed)
 Pt being seen in UC for R shoulder pain that radiates from back of neck to R shoulder/arm. Pt reports this has been occurring for approx 2.5 weeks, and frequently needing to try to get it unstuck.   Pt reports being at work and lifting heavy items so unsure if that is contributing, but denies known injury to area.   Pt has tried multiple otc medication with no relief.

## 2024-02-20 NOTE — ED Provider Notes (Signed)
 [facility]    CSN: [id number] Arrival date & time: 02/20/24  1334      History   Chief Complaint Chief Complaint  Patient presents with   Shoulder Pain    Pain back of neck to my arm I don't know if it's a pinch nerve or a frozen shoulder  I've been dealing with this pain for 2weeks now - Entered by patient    HPI Bianca Acosta is a 48 y.o. female.   Patient presents for evaluation of right sided shoulder pain beginning 2 weeks ago without precipitating event, injury or trauma.  Endorses that she does lift heavy items while at work but this is her normal.  Endorses that the shoulder locks up/freezes and at that time she has difficulty moving arm behind the back and raising above the head, has to stretch the arm out for approximately 10 minutes before any relief.  Has begun to experience pain in the back of the neck radiating down the arm with tingling.  Has attempted use of warm compresses ibuprofen, Aleve, Tylenol , massage stretching.   Past Medical History:  Diagnosis Date   Brain aneurysm 06/29/2015   a. s/p craniotomy.   Chest pain    a. 09/2019 Cardiac CT: Ca2+ score = Zero.   Chicken pox    Headache    History of echocardiogram    a. 12/2018 Echo: EF 60-65%. DD. Mild LAE; b. 11/2019 Echo: EF 60-65%, no rwma, nl PASP.    Hypertension    PSVT (paroxysmal supraventricular tachycardia) (HCC)    a. 08/2019 Zio: Avg HR 84 (NSR). 4 beats WCT (likely SVT w/ aberrancy). 4 SVT episodes, longes 9.9 secs. Rare PACs/PVCs (<1%). Some triggered events = SVT and/or PVCs.   Sleep apnea 10/2018   a. Does not tolerate CPAP.   Thyroid  nodule greater than or equal to 1.5 cm in diameter incidentally noted on imaging study    a. 09/2019 CTA Chest: Ill-defined 2.5cm L thyroid  nodule - rec f/u U/S; b. 10/2019 Thyroid  U/S: 4.4cm L-sided thyroid  nodule. FNA rec; c. 11/2019 FNA benign.   Tobacco abuse     Patient Active Problem List   Diagnosis Date Noted   No-show for appointment  09/06/2021   Vitamin D  insufficiency 08/26/2020   Cigarette nicotine dependence without complication 01/31/2020   Thyroid  nodule 10/29/2019   Hyperthyroidism 10/29/2019   URI (upper respiratory infection) 07/19/2019   OSA (obstructive sleep apnea) 12/25/2018   Intramural and subserous leiomyoma of uterus 10/25/2018   Hives 07/23/2016   Right knee pain 05/18/2016   Morbid obesity with BMI of 40.0-44.9, adult (HCC) 04/23/2016   HTN (hypertension) 04/23/2016   Encounter for preventative adult health care exam with abnormal findings 04/23/2016   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    Past Surgical History:  Procedure Laterality Date   CRANIOTOMY Right 06/29/2015   Procedure: CRANIOTOMY INTRACRANIAL ANEURYSM CLIPPING;  Surgeon: Morene Hicks Ditty, MD;  Location: [facility];  Service: Neurosurgery;  Laterality: Right;   IR GENERIC HISTORICAL  05/25/2016   IR ANGIO INTRA EXTRACRAN SEL INTERNAL CAROTID BILAT MOD SED 05/25/2016 Gerldine Maizes, MD [facility]   IR GENERIC HISTORICAL  06/29/2015   IR ANGIO INTRA EXTRACRAN SEL INTERNAL CAROTID BILAT MOD SED 06/29/2015 Gerldine Maizes, MD [facility]   IR GENERIC HISTORICAL  06/29/2015   IR ANGIO VERTEBRAL SEL VERTEBRAL UNI L MOD SED 06/29/2015 Gerldine Maizes, MD [facility]   IR  GENERIC HISTORICAL  06/29/2015   IR 3D INDEPENDENT WKST 06/29/2015 Gerldine Maizes, MD [facility]   UTERINE FIBROID SURGERY  2020    OB History     Gravida  4   Para  0   Term  0   Preterm      AB  4   Living  0      SAB  4   IAB      Ectopic      Multiple      Live Births               Home Medications    Prior to Admission medications   Medication Sig Start Date End Date Taking? Authorizing Provider  cyclobenzaprine  (FLEXERIL ) 10 MG tablet Take 1 tablet (10 mg total) by mouth at bedtime. 02/20/24  Yes Ruthmary Occhipinti R, NP  predniSONE  (STERAPRED UNI-PAK  21 TAB) 10 MG (21) TBPK tablet Take by mouth daily. Take 6 tabs by mouth daily  for 1 days, then 5 tabs for 1 days, then 4 tabs for 1 days, then 3 tabs for 1 days, 2 tabs for 1 days, then 1 tab by mouth daily for 1 days 02/20/24  Yes Isiaah Cuervo R, NP  albuterol  (PROVENTIL  HFA) 108 (90 Base) MCG/ACT inhaler Inhale 2 puffs into the lungs every 4 (four) hours as needed for wheezing or shortness of breath. 08/27/23   Kasa, Kurian, MD  albuterol  (PROVENTIL ) (2.5 MG/3ML) 0.083% nebulizer solution Take 3 mLs (2.5 mg total) by nebulization every 6 (six) hours as needed for wheezing or shortness of breath. 07/09/22 02/05/24  Viviann Pastor, MD  benzonatate  (TESSALON  PERLES) 100 MG capsule Take 1 capsule (100 mg total) by mouth 3 (three) times daily as needed for cough. Patient not taking: Reported on 02/05/2024 08/11/23 08/10/24  Ernest Ronal BRAVO, MD  carvedilol  (COREG ) 12.5 MG tablet Take 1 tablet (12.5 mg total) by mouth 2 (two) times daily. 08/17/22   Darron Deatrice LABOR, MD  cetirizine  (ZYRTEC ) 10 MG tablet TAKE 1 TABLET BY MOUTH EVERY DAY 02/22/20   Moishe Suzen LABOR, NP  cholecalciferol (VITAMIN D3) 25 MCG (1000 UT) tablet Take 1 tablet (1,000 Units total) by mouth daily. 09/26/18   Osker Tinnie HERO, FNP  methimazole  (TAPAZOLE ) 5 MG tablet TAKE 1/2 TABLET BY MOUTH DAILY 07/31/23   Shamleffer, Ibtehal Jaralla, MD  Probiotic Product (FORTIFY PROBIOTIC WOMENS EX ST) CPDR Take 1 capsule by mouth daily. Patient not taking: Reported on 02/20/2024 09/17/18   Osker Tinnie HERO, FNP  spironolactone  (ALDACTONE ) 25 MG tablet Take 1 tablet (25 mg total) by mouth daily. 08/20/23   Darron Deatrice LABOR, MD  triamterene -hydrochlorothiazide  (DYAZIDE) 37.5-25 MG capsule TAKE 1 EACH (1 CAPSULE TOTAL) BY MOUTH DAILY. OVERDUE FOLLOW UP VISIT. PLEASE CALL OFFICE TO SCHEDULE APPOINTMENT PRIOR TO NEXT REFILL (FIRST ATTEMPT) 06/14/23   Darron Deatrice LABOR, MD    Family History Family History  Problem Relation Age of Onset   Hypertension Mother     Hypertension Father    Hypertension Maternal Aunt    Hypertension Maternal Uncle    Hypertension Paternal Aunt    Hypertension Paternal Uncle    Hypertension Maternal Grandmother    Hypertension Maternal Grandfather    Hypertension Paternal Grandmother    Hypertension Paternal Grandfather     Social History Social History   Tobacco Use   Smoking status: Every Day    Current packs/day: 1.00    Average packs/day: 1 pack/day for 28.5 years (28.5 ttl  pk-yrs)    Types: Cigarettes    Start date: 1997   Smokeless tobacco: Never  Vaping Use   Vaping status: Never Used  Substance Use Topics   Alcohol use: Yes    Comment: ocassionally   Drug use: No     Allergies   Amoxicillin, Augmentin [amoxicillin-pot clavulanate], Ciprofloxacin, Clavulanic acid, and Macrobid [nitrofurantoin]   Review of Systems Review of Systems   Physical Exam Triage Vital Signs ED Triage Vitals  Encounter Vitals Group     BP 02/20/24 1339 (!) 179/98     Girls Systolic BP Percentile --      Girls Diastolic BP Percentile --      Boys Systolic BP Percentile --      Boys Diastolic BP Percentile --      Pulse Rate 02/20/24 1339 92     Resp 02/20/24 1339 18     Temp 02/20/24 1339 97.9 F (36.6 C)     Temp Source 02/20/24 1339 Oral     SpO2 02/20/24 1339 98 %     Weight --      Height --      Head Circumference --      Peak Flow --      Pain Score 02/20/24 1342 10     Pain Loc --      Pain Education --      Exclude from Growth Chart --    No data found.  Updated Vital Signs BP (!) 179/98 (BP Location: Left Arm)   Pulse 92   Temp 97.9 F (36.6 C) (Oral)   Resp 18   SpO2 98%   Visual Acuity Right Eye Distance:   Left Eye Distance:   Bilateral Distance:    Right Eye Near:   Left Eye Near:    Bilateral Near:     Physical Exam Constitutional:      Appearance: Normal appearance.  Eyes:     Extraocular Movements: Extraocular movements intact.  Pulmonary:     Effort: Pulmonary  effort is normal.  Musculoskeletal:     Comments: Unable to reproduce tenderness on exam, has pain elicited with lateral abduction of the arm, able to complete full range of motion, 2+ brachial pulse, negative Hawkins sign  Neurological:     Mental Status: She is alert and oriented to person, place, and time. Mental status is at baseline.      UC Treatments / Results  Labs (all labs ordered are listed, but only abnormal results are displayed) Labs Reviewed - No data to display  EKG   Radiology No results found.  Procedures Procedures (including critical care time)  Medications Ordered in UC Medications - No data to display  Initial Impression / Assessment and Plan / UC Course  I have reviewed the triage vital signs and the nursing notes.  Pertinent labs & imaging results that were available during my care of the patient were reviewed by me and considered in my medical decision making (see chart for details).  Acute pain of right shoulder  Etiology most likely muscular with nerve compression, possibly capsulitis as she endorses freezing and stiffening of the arm, imaging unavailable today discussed with patient prior to initiating visit offered IM NSAID but declined, prescribed prednisone  and Flexeril  for home use, recommended supportive care through [facility], given written handout of shoulder exercises, walking referral provided to orthopedics Final Clinical Impressions(s) / UC Diagnoses   Final diagnoses:  Acute pain of right shoulder     Discharge Instructions  Your pain is most likely caused by irritation to the muscles.  Prednisone  every morning with food as directed to reduce internal inflammation and help with pain, may use Tylenol  or any topical medicines additionally  May use muscle relaxant at bedtime as needed for additional comfort and to allow you to rest  You may use heating pad in 15 minute intervals as needed for additional comfort  Begin massaging  and stretching affected area daily for 10 minutes as tolerated to further loosen muscles   When sitting and lying down place pillow underneath arm and behind back  Can try sleeping without pillow on firm mattress   If pain persist after recommended treatment or reoccurs if may be beneficial to follow up with orthopedic specialist for evaluation, this doctor specializes in the bones and can manage your symptoms long-term with options such as but not limited to imaging, medications or physical therapy      ED Prescriptions     Medication Sig Dispense Auth. Provider   predniSONE  (STERAPRED UNI-PAK 21 TAB) 10 MG (21) TBPK tablet Take by mouth daily. Take 6 tabs by mouth daily  for 1 days, then 5 tabs for 1 days, then 4 tabs for 1 days, then 3 tabs for 1 days, 2 tabs for 1 days, then 1 tab by mouth daily for 1 days 21 tablet Wendle Kina R, NP   cyclobenzaprine  (FLEXERIL ) 10 MG tablet Take 1 tablet (10 mg total) by mouth at bedtime. 10 tablet Alden Bensinger R, NP      PDMP not reviewed this encounter.   Teresa Shelba SAUNDERS, NP 02/20/24 1451

## 2024-02-20 NOTE — Discharge Instructions (Signed)
 Your pain is most likely caused by irritation to the muscles.  Prednisone  every morning with food as directed to reduce internal inflammation and help with pain, may use Tylenol  or any topical medicines additionally  May use muscle relaxant at bedtime as needed for additional comfort and to allow you to rest  You may use heating pad in 15 minute intervals as needed for additional comfort  Begin massaging and stretching affected area daily for 10 minutes as tolerated to further loosen muscles   When sitting and lying down place pillow underneath arm and behind back  Can try sleeping without pillow on firm mattress   If pain persist after recommended treatment or reoccurs if may be beneficial to follow up with orthopedic specialist for evaluation, this doctor specializes in the bones and can manage your symptoms long-term with options such as but not limited to imaging, medications or physical therapy

## 2024-02-28 ENCOUNTER — Encounter: Payer: Self-pay | Admitting: Cardiovascular Disease

## 2024-02-28 MED ORDER — TRIAMTERENE-HCTZ 37.5-25 MG PO CAPS: 1.0000 | ORAL_CAPSULE | Freq: Every day | ORAL | 0 refills | Status: DC

## 2024-03-09 DIAGNOSIS — M5412 Radiculopathy, cervical region: Secondary | ICD-10-CM | POA: Diagnosis not present

## 2024-03-17 DIAGNOSIS — M5412 Radiculopathy, cervical region: Secondary | ICD-10-CM | POA: Diagnosis not present

## 2024-03-19 ENCOUNTER — Encounter: Payer: Self-pay | Admitting: *Deleted

## 2024-03-22 ENCOUNTER — Other Ambulatory Visit: Payer: Self-pay | Admitting: Nurse Practitioner

## 2024-03-25 ENCOUNTER — Encounter: Payer: Self-pay | Admitting: Nurse Practitioner

## 2024-03-25 ENCOUNTER — Ambulatory Visit: Attending: Nurse Practitioner | Admitting: Nurse Practitioner

## 2024-03-25 VITALS — BP 142/82 | HR 96 | Ht 63.0 in | Wt 247.6 lb

## 2024-03-25 DIAGNOSIS — I671 Cerebral aneurysm, nonruptured: Secondary | ICD-10-CM | POA: Diagnosis not present

## 2024-03-25 DIAGNOSIS — I471 Supraventricular tachycardia, unspecified: Secondary | ICD-10-CM

## 2024-03-25 DIAGNOSIS — E0590 Thyrotoxicosis, unspecified without thyrotoxic crisis or storm: Secondary | ICD-10-CM | POA: Diagnosis not present

## 2024-03-25 DIAGNOSIS — G4733 Obstructive sleep apnea (adult) (pediatric): Secondary | ICD-10-CM | POA: Diagnosis not present

## 2024-03-25 DIAGNOSIS — I10 Essential (primary) hypertension: Secondary | ICD-10-CM | POA: Diagnosis not present

## 2024-03-25 MED ORDER — SPIRONOLACTONE 25 MG PO TABS: 25.0000 mg | ORAL_TABLET | Freq: Every day | ORAL | 1 refills | Status: AC

## 2024-03-25 MED ORDER — TRIAMTERENE-HCTZ 37.5-25 MG PO CAPS: 1.0000 | ORAL_CAPSULE | Freq: Every day | ORAL | 1 refills | Status: AC

## 2024-03-25 MED ORDER — CARVEDILOL 12.5 MG PO TABS: 12.5000 mg | ORAL_TABLET | Freq: Two times a day (BID) | ORAL | 1 refills | Status: AC

## 2024-03-25 NOTE — Progress Notes (Signed)
 Office Visit    Patient Name: Bianca Acosta Date of Encounter: 03/25/2024  Primary Care Provider:  [facility], F[place], MD Primary Cardiologist:  Deatrice Cage, MD    Chief Complaint    48 y.o. female with a history of hypertension, tobacco abuse, obesity, sleep apnea, PSVT, chest pain with coronary calcium score of 0, hypothyroidism/goiter, and brain aneurysm status post craniotomy in 2016, who presents for follow-up related to HTN.  Past Medical History   Subjective   Past Medical History:  Diagnosis Date   Brain aneurysm 06/29/2015   a. s/p craniotomy.   Chest pain    a. 09/2019 Cardiac CT: Ca2+ score = Zero.   Chicken pox    Headache    History of echocardiogram    a. 12/2018 Echo: EF 60-65%. DD. Mild LAE; b. 11/2019 Echo: EF 60-65%, no rwma, nl PASP.    Hypertension    PSVT (paroxysmal supraventricular tachycardia) (HCC)    a. 08/2019 Zio: Avg HR 84 (NSR). 4 beats WCT (likely SVT w/ aberrancy). 4 SVT episodes, longes 9.9 secs. Rare PACs/PVCs (<1%). Some triggered events = SVT and/or PVCs.   Sleep apnea 10/2018   a. Does not tolerate CPAP.   Thyroid  nodule greater than or equal to 1.5 cm in diameter incidentally noted on imaging study    a. 09/2019 CTA Chest: Ill-defined 2.5cm L thyroid  nodule - rec f/u U/S; b. 10/2019 Thyroid  U/S: 4.4cm L-sided thyroid  nodule. FNA rec; c. 11/2019 FNA benign.   Tobacco abuse    Past Surgical History:  Procedure Laterality Date   CRANIOTOMY Right 06/29/2015   Procedure: CRANIOTOMY INTRACRANIAL ANEURYSM CLIPPING;  Surgeon: Morene Hicks Ditty, MD;  Location: [facility];  Service: Neurosurgery;  Laterality: Right;   IR GENERIC HISTORICAL  05/25/2016   IR ANGIO INTRA EXTRACRAN SEL INTERNAL CAROTID BILAT MOD SED 05/25/2016 Gerldine Maizes, MD [facility]   IR GENERIC HISTORICAL  06/29/2015   IR ANGIO INTRA EXTRACRAN SEL INTERNAL CAROTID BILAT MOD SED 06/29/2015 Gerldine Maizes, MD [facility]   IR GENERIC HISTORICAL   06/29/2015   IR ANGIO VERTEBRAL SEL VERTEBRAL UNI L MOD SED 06/29/2015 Gerldine Maizes, MD [facility]   IR GENERIC HISTORICAL  06/29/2015   IR 3D INDEPENDENT WKST 06/29/2015 Gerldine Maizes, MD [facility]   UTERINE FIBROID SURGERY  2020    Allergies  Allergies  Allergen Reactions   Amoxicillin    Augmentin [Amoxicillin-Pot Clavulanate] Hives   Ciprofloxacin Itching    Patient states she starts itching from inside or internal.   Clavulanic Acid    Macrobid [Nitrofurantoin] Hives       History of Present Illness      48 y.o. y/o female with a history of hypertension, tobacco abuse, obesity, sleep apnea, PSVT, chest pain with coronary calcium score of 0, thyroid  nodule, hyperthyroidism/goiter, and brain aneurysm status post craniotomy in 2016.  She has had difficult to control hypertension dating back to her 30s.  Echocardiogram in May 2020, showed normal LV function with diastolic dysfunction, and mild left atrial enlargement.  In January 2021, she had an episode of chest pain and also reported more frequent palpitations.  Cardiac CT for calcium scoring was performed with a calcium score of 0.  Event monitoring showed 4 beats of wide-complex tachycardia felt to be SVT with aberrancy, along with 4 episodes of SVT, the longest lasting 9.9 seconds.  Some triggered events were associated with SVT and/or PVCs.  She was later found to be hyperthyroid and subsequently  noted to have a 2.5 cm left thyroid  nodule on CT of the chest in November 2021.  She was seen by endocrinology and placed on methimazole .  Around the same time, she was having significant hypokalemia in the setting of chlorthalidone  therapy which was discontinued resulting in weight gain and swelling, prompting initiation of triamterene  HCTZ.   Bianca Acosta was last seen in cardiology clinic in November 2023.  She says that she has done well from a cardiac standpoint since then.  She continues to work at [employer] and just received a  promotion, now working in Control and instrumentation engineer.  She is very busy at work and is working 6 days a week.  In that setting, she has very little time for exercise and therefore is not exercising.  She says she is compliant with her medications but notes that she regularly misses her p.m. dose of carvedilol .  She denies chest pain, dyspnea, palpitations, PND, orthopnea, dizziness, syncope, edema, or early satiety.  She has been having numbness and discomfort in her right arm and hand.  She has been seen by [facility] and told that she may have a slipped disc.  She is to undergo physical therapy. Objective   Home Medications    Current Outpatient Medications  Medication Sig Dispense Refill   albuterol  (PROVENTIL  HFA) 108 (90 Base) MCG/ACT inhaler Inhale 2 puffs into the lungs every 4 (four) hours as needed for wheezing or shortness of breath. 1 each 11   albuterol  (PROVENTIL ) (2.5 MG/3ML) 0.083% nebulizer solution Take 3 mLs (2.5 mg total) by nebulization every 6 (six) hours as needed for wheezing or shortness of breath. 75 mL 2   benzonatate  (TESSALON  PERLES) 100 MG capsule Take 1 capsule (100 mg total) by mouth 3 (three) times daily as needed for cough. 30 capsule 0   carvedilol  (COREG ) 12.5 MG tablet Take 1 tablet (12.5 mg total) by mouth 2 (two) times daily. 180 tablet 1   cetirizine  (ZYRTEC ) 10 MG tablet TAKE 1 TABLET BY MOUTH EVERY DAY 30 tablet 0   cholecalciferol (VITAMIN D3) 25 MCG (1000 UT) tablet Take 1 tablet (1,000 Units total) by mouth daily. 90 tablet 3   cyclobenzaprine  (FLEXERIL ) 10 MG tablet Take 1 tablet (10 mg total) by mouth at bedtime. 10 tablet 0   methimazole  (TAPAZOLE ) 5 MG tablet TAKE 1/2 TABLET BY MOUTH DAILY 45 tablet 1   predniSONE  (STERAPRED UNI-PAK 21 TAB) 10 MG (21) TBPK tablet Take by mouth daily. Take 6 tabs by mouth daily  for 1 days, then 5 tabs for 1 days, then 4 tabs for 1 days, then 3 tabs for 1 days, 2 tabs for 1 days, then 1 tab by mouth daily for 1 days 21 tablet  0   Probiotic Product (FORTIFY PROBIOTIC WOMENS EX ST) CPDR Take 1 capsule by mouth daily. 30 capsule 2   spironolactone  (ALDACTONE ) 25 MG tablet Take 1 tablet (25 mg total) by mouth daily. 90 tablet 0   triamterene -hydrochlorothiazide  (DYAZIDE) 37.5-25 MG capsule Take 1 each (1 capsule total) by mouth daily. 30 capsule 0   No current facility-administered medications for this visit.     Physical Exam    VS:  BP (!) 143/80 (BP Location: Left Arm, Patient Position: Sitting, Cuff Size: Normal)   Pulse 96   Ht 5' 3 (1.6 m)   Wt 247 lb 9.6 oz (112.3 kg)   SpO2 98%   BMI 43.86 kg/m  , BMI Body mass index is 43.86 kg/m.  Vitals:   03/25/24 0923 03/25/24 1221  BP: (!) 143/80 (!) 142/82  Pulse: 96   SpO2: 98%           GEN: Well nourished, well developed, in no acute distress. HEENT: normal. Neck: Supple, no JVD, carotid bruits, or masses. Cardiac: RRR, no murmurs, rubs, or gallops. No clubbing, cyanosis, edema.  Radials 2+/PT 2+ and equal bilaterally.  Respiratory:  Respirations regular and unlabored, clear to auscultation bilaterally. GI: Soft, nontender, nondistended, BS + x 4. MS: no deformity or atrophy. Skin: warm and dry, no rash. Neuro:  Strength and sensation are intact. Psych: Normal affect.  Accessory Clinical Findings    ECG personally reviewed by me today - EKG Interpretation Date/Time:  Wednesday March 25 2024 09:34:48 EDT Ventricular Rate:  96 PR Interval:  152 QRS Duration:  78 QT Interval:  382 QTC Calculation: 482 R Axis:   13  Text Interpretation: Normal sinus rhythm Nonspecific T wave abnormality Prolonged QT Confirmed by Vivienne Bruckner [phone]) on 03/25/2024 9:38:25 AM  - no acute changes.  Lab Results  Component Value Date   WBC 7.7 08/11/2023   HGB 12.9 08/11/2023   HCT 39.1 08/11/2023   MCV 80.8 08/11/2023   PLT 230 08/11/2023   Lab Results  Component Value Date   CREATININE 0.81 08/11/2023   BUN 16 08/11/2023   NA 140 08/11/2023    K 3.6 08/11/2023   CL 108 08/11/2023   CO2 22 08/11/2023   Lab Results  Component Value Date   ALT 22 08/11/2023   AST 22 08/11/2023   ALKPHOS 64 08/11/2023   BILITOT 0.7 08/11/2023   Lab Results  Component Value Date   CHOL 134 05/16/2018   HDL 49 (L) 05/16/2018   LDLCALC 69 05/16/2018   TRIG 81 05/16/2018   CHOLHDL 2.7 05/16/2018    Lab Results  Component Value Date   HGBA1C 6.2 (A) 04/12/2022   Lab Results  Component Value Date   TSH 0.28 (L) 02/05/2024       Assessment & Plan    1.  Primary hypertension: Blood pressure elevated today.  On further discussion, patient says that she has only been taking carvedilol  once a day because she forgets the evening dose.  We discussed the importance of compliance.  She is otherwise on spironolactone  and triamterene -HCTZ.  Based on our records, she should have run out of spironolactone  in April and triamterene  HCTZ in July however, she says she still has the medications at home.  Follow-up basic metabolic panel today and refill medications.  2.  Palpitations/PSVT: History of palpitations with monitoring in January 2021 showing brief episodes of PSVT.  Denies palpitations today.  Remains on carvedilol , which she will start taking twice a day.  3.  Hyperthyroidism: On methimazole  and followed by endocrinology.  There was previous discussion regarding thyroidectomy however, this has yet to be performed and patient has persistent goiter.  TSH was 0.28 in June, at which time methimazole  was resumed.  4.  Obstructive sleep apnea: Reports compliance with CPAP.  5.  Tobacco abuse: Still smoking.  Cessation previously advised.  Not currently considering quitting.  6.  Disposition: Follow-up basic metabolic panel.  Refilling carvedilol , spironolactone , and triamterene -HCTZ depending upon lab work.  Follow-up in clinic in 6 months or sooner if necessary.  Bruckner Vivienne, NP 03/25/2024, 9:38 AM

## 2024-03-25 NOTE — Patient Instructions (Signed)
 Medication Instructions:  The current medical regimen is effective;  continue present plan and medications as directed. Please refer to the Current Medication list given to you today.   *If you need a refill on your cardiac medications before your next appointment, please call your pharmacy*  Lab Work: Your provider would like for you to have following labs drawn today BMET.   If you have labs (blood work) drawn today and your tests are completely normal, you will receive your results only by: MyChart Message (if you have MyChart) OR A paper copy in the mail If you have any lab test that is abnormal or we need to change your treatment, we will call you to review the results.  Follow-Up: At [facility], you and your health needs are our priority.  As part of our continuing mission to provide you with exceptional heart care, our providers are all part of one team.  This team includes your primary Cardiologist (physician) and Advanced Practice Providers or APPs (Physician Assistants and Nurse Practitioners) who all work together to provide you with the care you need, when you need it.  Your next appointment:   6 month(s)  Provider:   Lonni Meager, NP    We recommend signing up for the patient portal called MyChart.  Sign up information is provided on this After Visit Summary.  MyChart is used to connect with patients for Virtual Visits (Telemedicine).  Patients are able to view lab/test results, encounter notes, upcoming appointments, etc.  Non-urgent messages can be sent to your provider as well.   To learn more about what you can do with MyChart, go to [email or web address].

## 2024-03-26 ENCOUNTER — Ambulatory Visit: Payer: Self-pay | Admitting: Nurse Practitioner

## 2024-03-26 LAB — BASIC METABOLIC PANEL WITH GFR
BUN/Creatinine Ratio: 14 (ref 9–23)
BUN: 13 mg/dL (ref 6–24)
CO2: 23 mmol/L (ref 20–29)
Calcium: 9 mg/dL (ref 8.7–10.2)
Chloride: 106 mmol/L (ref 96–106)
Creatinine, Ser: 0.93 mg/dL (ref 0.57–1.00)
Glucose: 106 mg/dL — ABNORMAL HIGH (ref 70–99)
Potassium: 4.2 mmol/L (ref 3.5–5.2)
Sodium: 143 mmol/L (ref 134–144)
eGFR: 76 mL/min/1.73 (ref >59)

## 2024-04-07 DIAGNOSIS — M5412 Radiculopathy, cervical region: Secondary | ICD-10-CM | POA: Diagnosis not present

## 2024-04-27 DIAGNOSIS — M5412 Radiculopathy, cervical region: Secondary | ICD-10-CM | POA: Diagnosis not present

## 2024-08-24 ENCOUNTER — Ambulatory Visit: Admitting: Internal Medicine
